# Patient Record
Sex: Female | Born: 1960 | Race: White | Hispanic: No | Smoking: Never smoker
Health system: Southern US, Community
[De-identification: ages and names within clinical notes are randomized; demographics above are authoritative.]

## PROBLEM LIST (undated history)

## (undated) DIAGNOSIS — I10 Essential (primary) hypertension: Secondary | ICD-10-CM

## (undated) DIAGNOSIS — R51 Headache: Secondary | ICD-10-CM

## (undated) DIAGNOSIS — E785 Hyperlipidemia, unspecified: Secondary | ICD-10-CM

## (undated) DIAGNOSIS — I639 Cerebral infarction, unspecified: Secondary | ICD-10-CM

## (undated) DIAGNOSIS — I2510 Atherosclerotic heart disease of native coronary artery without angina pectoris: Secondary | ICD-10-CM

## (undated) DIAGNOSIS — F329 Major depressive disorder, single episode, unspecified: Secondary | ICD-10-CM

## (undated) DIAGNOSIS — M1990 Unspecified osteoarthritis, unspecified site: Secondary | ICD-10-CM

## (undated) DIAGNOSIS — M810 Age-related osteoporosis without current pathological fracture: Secondary | ICD-10-CM

## (undated) DIAGNOSIS — E039 Hypothyroidism, unspecified: Secondary | ICD-10-CM

## (undated) DIAGNOSIS — D649 Anemia, unspecified: Secondary | ICD-10-CM

## (undated) DIAGNOSIS — Z5189 Encounter for other specified aftercare: Secondary | ICD-10-CM

## (undated) DIAGNOSIS — K5900 Constipation, unspecified: Secondary | ICD-10-CM

## (undated) DIAGNOSIS — J189 Pneumonia, unspecified organism: Secondary | ICD-10-CM

## (undated) DIAGNOSIS — R519 Headache, unspecified: Secondary | ICD-10-CM

## (undated) DIAGNOSIS — E079 Disorder of thyroid, unspecified: Secondary | ICD-10-CM

## (undated) DIAGNOSIS — I509 Heart failure, unspecified: Secondary | ICD-10-CM

## (undated) DIAGNOSIS — Z9889 Other specified postprocedural states: Secondary | ICD-10-CM

## (undated) DIAGNOSIS — I219 Acute myocardial infarction, unspecified: Secondary | ICD-10-CM

## (undated) DIAGNOSIS — M3214 Glomerular disease in systemic lupus erythematosus: Secondary | ICD-10-CM

## (undated) DIAGNOSIS — R112 Nausea with vomiting, unspecified: Secondary | ICD-10-CM

## (undated) DIAGNOSIS — K219 Gastro-esophageal reflux disease without esophagitis: Secondary | ICD-10-CM

## (undated) DIAGNOSIS — N189 Chronic kidney disease, unspecified: Secondary | ICD-10-CM

## (undated) DIAGNOSIS — R011 Cardiac murmur, unspecified: Secondary | ICD-10-CM

## (undated) DIAGNOSIS — F32A Depression, unspecified: Secondary | ICD-10-CM

## (undated) DIAGNOSIS — R7303 Prediabetes: Secondary | ICD-10-CM

## (undated) DIAGNOSIS — F419 Anxiety disorder, unspecified: Secondary | ICD-10-CM

## (undated) DIAGNOSIS — M329 Systemic lupus erythematosus, unspecified: Secondary | ICD-10-CM

## (undated) HISTORY — PX: COLONOSCOPY: SHX174

## (undated) HISTORY — DX: Unspecified osteoarthritis, unspecified site: M19.90

## (undated) HISTORY — DX: Age-related osteoporosis without current pathological fracture: M81.0

## (undated) HISTORY — DX: Major depressive disorder, single episode, unspecified: F32.9

## (undated) HISTORY — DX: Cardiac murmur, unspecified: R01.1

## (undated) HISTORY — DX: Essential (primary) hypertension: I10

## (undated) HISTORY — DX: Heart failure, unspecified: I50.9

## (undated) HISTORY — DX: Gastro-esophageal reflux disease without esophagitis: K21.9

## (undated) HISTORY — DX: Systemic lupus erythematosus, unspecified: M32.9

## (undated) HISTORY — DX: Glomerular disease in systemic lupus erythematosus: M32.14

## (undated) HISTORY — PX: CORONARY ARTERY BYPASS GRAFT: SHX141

## (undated) HISTORY — DX: Disorder of thyroid, unspecified: E07.9

## (undated) HISTORY — DX: Hyperlipidemia, unspecified: E78.5

## (undated) HISTORY — PX: WISDOM TOOTH EXTRACTION: SHX21

## (undated) HISTORY — DX: Cerebral infarction, unspecified: I63.9

## (undated) HISTORY — DX: Anxiety disorder, unspecified: F41.9

## (undated) HISTORY — DX: Anemia, unspecified: D64.9

## (undated) HISTORY — DX: Depression, unspecified: F32.A

## (undated) HISTORY — DX: Acute myocardial infarction, unspecified: I21.9

## (undated) HISTORY — PX: JOINT REPLACEMENT: SHX530

## (undated) HISTORY — DX: Chronic kidney disease, unspecified: N18.9

## (undated) HISTORY — DX: Encounter for other specified aftercare: Z51.89

## (undated) HISTORY — PX: BREAST SURGERY: SHX581

---

## 1988-03-20 HISTORY — PX: MUSCLE FLAP CLOSURE: SHX2054

## 2015-04-27 DIAGNOSIS — I10 Essential (primary) hypertension: Secondary | ICD-10-CM | POA: Diagnosis not present

## 2015-04-27 DIAGNOSIS — G894 Chronic pain syndrome: Secondary | ICD-10-CM | POA: Diagnosis not present

## 2015-04-27 DIAGNOSIS — M3214 Glomerular disease in systemic lupus erythematosus: Secondary | ICD-10-CM | POA: Diagnosis not present

## 2015-04-27 DIAGNOSIS — E034 Atrophy of thyroid (acquired): Secondary | ICD-10-CM | POA: Diagnosis not present

## 2015-04-27 DIAGNOSIS — R809 Proteinuria, unspecified: Secondary | ICD-10-CM | POA: Diagnosis not present

## 2015-04-27 DIAGNOSIS — N183 Chronic kidney disease, stage 3 (moderate): Secondary | ICD-10-CM | POA: Diagnosis not present

## 2015-04-27 DIAGNOSIS — E782 Mixed hyperlipidemia: Secondary | ICD-10-CM | POA: Diagnosis not present

## 2015-04-27 DIAGNOSIS — R7301 Impaired fasting glucose: Secondary | ICD-10-CM | POA: Diagnosis not present

## 2015-04-27 DIAGNOSIS — B029 Zoster without complications: Secondary | ICD-10-CM | POA: Diagnosis not present

## 2015-04-27 DIAGNOSIS — R300 Dysuria: Secondary | ICD-10-CM | POA: Diagnosis not present

## 2015-04-27 DIAGNOSIS — N390 Urinary tract infection, site not specified: Secondary | ICD-10-CM | POA: Diagnosis not present

## 2015-05-06 DIAGNOSIS — N390 Urinary tract infection, site not specified: Secondary | ICD-10-CM | POA: Diagnosis not present

## 2015-05-06 DIAGNOSIS — D631 Anemia in chronic kidney disease: Secondary | ICD-10-CM | POA: Diagnosis not present

## 2015-05-06 DIAGNOSIS — I10 Essential (primary) hypertension: Secondary | ICD-10-CM | POA: Diagnosis not present

## 2015-06-09 DIAGNOSIS — N183 Chronic kidney disease, stage 3 (moderate): Secondary | ICD-10-CM | POA: Diagnosis not present

## 2015-07-27 DIAGNOSIS — R799 Abnormal finding of blood chemistry, unspecified: Secondary | ICD-10-CM | POA: Diagnosis not present

## 2015-09-02 DIAGNOSIS — M25562 Pain in left knee: Secondary | ICD-10-CM | POA: Diagnosis not present

## 2015-09-02 DIAGNOSIS — M7122 Synovial cyst of popliteal space [Baker], left knee: Secondary | ICD-10-CM | POA: Diagnosis not present

## 2015-09-02 DIAGNOSIS — R7301 Impaired fasting glucose: Secondary | ICD-10-CM | POA: Diagnosis not present

## 2015-09-02 DIAGNOSIS — G894 Chronic pain syndrome: Secondary | ICD-10-CM | POA: Diagnosis not present

## 2015-09-02 DIAGNOSIS — I119 Hypertensive heart disease without heart failure: Secondary | ICD-10-CM | POA: Diagnosis not present

## 2015-09-02 DIAGNOSIS — M25552 Pain in left hip: Secondary | ICD-10-CM | POA: Diagnosis not present

## 2015-09-02 DIAGNOSIS — M3214 Glomerular disease in systemic lupus erythematosus: Secondary | ICD-10-CM | POA: Diagnosis not present

## 2015-09-02 DIAGNOSIS — D641 Secondary sideroblastic anemia due to disease: Secondary | ICD-10-CM | POA: Diagnosis not present

## 2015-09-02 DIAGNOSIS — E782 Mixed hyperlipidemia: Secondary | ICD-10-CM | POA: Diagnosis not present

## 2015-09-02 DIAGNOSIS — E038 Other specified hypothyroidism: Secondary | ICD-10-CM | POA: Diagnosis not present

## 2015-09-10 DIAGNOSIS — M7122 Synovial cyst of popliteal space [Baker], left knee: Secondary | ICD-10-CM | POA: Diagnosis not present

## 2015-10-20 DIAGNOSIS — M545 Low back pain: Secondary | ICD-10-CM | POA: Diagnosis not present

## 2015-10-20 DIAGNOSIS — Z7689 Persons encountering health services in other specified circumstances: Secondary | ICD-10-CM | POA: Diagnosis not present

## 2015-10-26 DIAGNOSIS — R87612 Low grade squamous intraepithelial lesion on cytologic smear of cervix (LGSIL): Secondary | ICD-10-CM | POA: Diagnosis not present

## 2015-10-26 DIAGNOSIS — Z0000 Encounter for general adult medical examination without abnormal findings: Secondary | ICD-10-CM | POA: Diagnosis not present

## 2015-10-26 DIAGNOSIS — M810 Age-related osteoporosis without current pathological fracture: Secondary | ICD-10-CM | POA: Diagnosis not present

## 2015-10-26 DIAGNOSIS — Z1239 Encounter for other screening for malignant neoplasm of breast: Secondary | ICD-10-CM | POA: Diagnosis not present

## 2015-10-26 DIAGNOSIS — Z1211 Encounter for screening for malignant neoplasm of colon: Secondary | ICD-10-CM | POA: Diagnosis not present

## 2015-11-03 DIAGNOSIS — N390 Urinary tract infection, site not specified: Secondary | ICD-10-CM | POA: Diagnosis not present

## 2015-11-03 DIAGNOSIS — N183 Chronic kidney disease, stage 3 (moderate): Secondary | ICD-10-CM | POA: Diagnosis not present

## 2015-11-03 DIAGNOSIS — E559 Vitamin D deficiency, unspecified: Secondary | ICD-10-CM | POA: Diagnosis not present

## 2015-11-03 DIAGNOSIS — I10 Essential (primary) hypertension: Secondary | ICD-10-CM | POA: Diagnosis not present

## 2015-11-03 DIAGNOSIS — D631 Anemia in chronic kidney disease: Secondary | ICD-10-CM | POA: Diagnosis not present

## 2015-11-11 DIAGNOSIS — Z1211 Encounter for screening for malignant neoplasm of colon: Secondary | ICD-10-CM | POA: Diagnosis not present

## 2015-11-18 DIAGNOSIS — Z1231 Encounter for screening mammogram for malignant neoplasm of breast: Secondary | ICD-10-CM | POA: Diagnosis not present

## 2015-11-25 DIAGNOSIS — E038 Other specified hypothyroidism: Secondary | ICD-10-CM | POA: Diagnosis not present

## 2015-11-25 DIAGNOSIS — M3214 Glomerular disease in systemic lupus erythematosus: Secondary | ICD-10-CM | POA: Diagnosis not present

## 2015-11-25 DIAGNOSIS — F331 Major depressive disorder, recurrent, moderate: Secondary | ICD-10-CM | POA: Diagnosis not present

## 2015-12-01 DIAGNOSIS — N879 Dysplasia of cervix uteri, unspecified: Secondary | ICD-10-CM | POA: Diagnosis not present

## 2015-12-01 DIAGNOSIS — D067 Carcinoma in situ of other parts of cervix: Secondary | ICD-10-CM | POA: Diagnosis not present

## 2015-12-08 DIAGNOSIS — N879 Dysplasia of cervix uteri, unspecified: Secondary | ICD-10-CM | POA: Diagnosis not present

## 2015-12-14 DIAGNOSIS — R7301 Impaired fasting glucose: Secondary | ICD-10-CM | POA: Diagnosis not present

## 2015-12-14 DIAGNOSIS — E782 Mixed hyperlipidemia: Secondary | ICD-10-CM | POA: Diagnosis not present

## 2015-12-14 DIAGNOSIS — I119 Hypertensive heart disease without heart failure: Secondary | ICD-10-CM | POA: Diagnosis not present

## 2015-12-14 DIAGNOSIS — I2510 Atherosclerotic heart disease of native coronary artery without angina pectoris: Secondary | ICD-10-CM | POA: Diagnosis not present

## 2015-12-14 DIAGNOSIS — F331 Major depressive disorder, recurrent, moderate: Secondary | ICD-10-CM | POA: Diagnosis not present

## 2015-12-14 DIAGNOSIS — N3000 Acute cystitis without hematuria: Secondary | ICD-10-CM | POA: Diagnosis not present

## 2015-12-14 DIAGNOSIS — W0110XA Fall on same level from slipping, tripping and stumbling with subsequent striking against unspecified object, initial encounter: Secondary | ICD-10-CM | POA: Diagnosis not present

## 2015-12-14 DIAGNOSIS — Z01810 Encounter for preprocedural cardiovascular examination: Secondary | ICD-10-CM | POA: Diagnosis not present

## 2015-12-14 DIAGNOSIS — E038 Other specified hypothyroidism: Secondary | ICD-10-CM | POA: Diagnosis not present

## 2015-12-20 DIAGNOSIS — Z951 Presence of aortocoronary bypass graft: Secondary | ICD-10-CM | POA: Diagnosis not present

## 2015-12-20 DIAGNOSIS — Z01810 Encounter for preprocedural cardiovascular examination: Secondary | ICD-10-CM | POA: Diagnosis not present

## 2015-12-21 DIAGNOSIS — N879 Dysplasia of cervix uteri, unspecified: Secondary | ICD-10-CM | POA: Diagnosis not present

## 2016-01-13 DIAGNOSIS — Z87891 Personal history of nicotine dependence: Secondary | ICD-10-CM | POA: Diagnosis not present

## 2016-01-13 DIAGNOSIS — D060 Carcinoma in situ of endocervix: Secondary | ICD-10-CM | POA: Diagnosis not present

## 2016-01-13 DIAGNOSIS — Z79899 Other long term (current) drug therapy: Secondary | ICD-10-CM | POA: Diagnosis not present

## 2016-01-13 DIAGNOSIS — E785 Hyperlipidemia, unspecified: Secondary | ICD-10-CM | POA: Diagnosis not present

## 2016-01-13 DIAGNOSIS — N870 Mild cervical dysplasia: Secondary | ICD-10-CM | POA: Diagnosis not present

## 2016-01-13 DIAGNOSIS — R079 Chest pain, unspecified: Secondary | ICD-10-CM | POA: Diagnosis not present

## 2016-01-13 DIAGNOSIS — G4700 Insomnia, unspecified: Secondary | ICD-10-CM | POA: Diagnosis not present

## 2016-01-13 DIAGNOSIS — F419 Anxiety disorder, unspecified: Secondary | ICD-10-CM | POA: Diagnosis not present

## 2016-01-13 DIAGNOSIS — J45909 Unspecified asthma, uncomplicated: Secondary | ICD-10-CM | POA: Diagnosis not present

## 2016-01-13 DIAGNOSIS — Z8673 Personal history of transient ischemic attack (TIA), and cerebral infarction without residual deficits: Secondary | ICD-10-CM | POA: Diagnosis not present

## 2016-01-13 DIAGNOSIS — I252 Old myocardial infarction: Secondary | ICD-10-CM | POA: Diagnosis not present

## 2016-01-13 DIAGNOSIS — N183 Chronic kidney disease, stage 3 (moderate): Secondary | ICD-10-CM | POA: Diagnosis not present

## 2016-01-13 DIAGNOSIS — I2510 Atherosclerotic heart disease of native coronary artery without angina pectoris: Secondary | ICD-10-CM | POA: Diagnosis not present

## 2016-01-13 DIAGNOSIS — E039 Hypothyroidism, unspecified: Secondary | ICD-10-CM | POA: Diagnosis not present

## 2016-01-13 DIAGNOSIS — D069 Carcinoma in situ of cervix, unspecified: Secondary | ICD-10-CM | POA: Diagnosis not present

## 2016-01-13 DIAGNOSIS — F329 Major depressive disorder, single episode, unspecified: Secondary | ICD-10-CM | POA: Diagnosis not present

## 2016-01-13 DIAGNOSIS — K219 Gastro-esophageal reflux disease without esophagitis: Secondary | ICD-10-CM | POA: Diagnosis not present

## 2016-01-13 DIAGNOSIS — I509 Heart failure, unspecified: Secondary | ICD-10-CM | POA: Diagnosis not present

## 2016-01-13 DIAGNOSIS — N871 Moderate cervical dysplasia: Secondary | ICD-10-CM | POA: Diagnosis not present

## 2016-01-13 DIAGNOSIS — I130 Hypertensive heart and chronic kidney disease with heart failure and stage 1 through stage 4 chronic kidney disease, or unspecified chronic kidney disease: Secondary | ICD-10-CM | POA: Diagnosis not present

## 2016-01-13 DIAGNOSIS — N879 Dysplasia of cervix uteri, unspecified: Secondary | ICD-10-CM | POA: Diagnosis not present

## 2016-01-13 DIAGNOSIS — Z951 Presence of aortocoronary bypass graft: Secondary | ICD-10-CM | POA: Diagnosis not present

## 2016-01-27 DIAGNOSIS — M009 Pyogenic arthritis, unspecified: Secondary | ICD-10-CM | POA: Diagnosis not present

## 2016-01-27 DIAGNOSIS — Z79899 Other long term (current) drug therapy: Secondary | ICD-10-CM | POA: Diagnosis not present

## 2016-01-27 DIAGNOSIS — Z87891 Personal history of nicotine dependence: Secondary | ICD-10-CM | POA: Diagnosis not present

## 2016-01-27 DIAGNOSIS — I25708 Atherosclerosis of coronary artery bypass graft(s), unspecified, with other forms of angina pectoris: Secondary | ICD-10-CM | POA: Diagnosis not present

## 2016-01-27 DIAGNOSIS — Z96651 Presence of right artificial knee joint: Secondary | ICD-10-CM | POA: Diagnosis not present

## 2016-01-27 DIAGNOSIS — Z951 Presence of aortocoronary bypass graft: Secondary | ICD-10-CM | POA: Diagnosis not present

## 2016-01-27 DIAGNOSIS — R5084 Febrile nonhemolytic transfusion reaction: Secondary | ICD-10-CM | POA: Diagnosis not present

## 2016-01-27 DIAGNOSIS — A419 Sepsis, unspecified organism: Secondary | ICD-10-CM | POA: Diagnosis not present

## 2016-01-27 DIAGNOSIS — K5900 Constipation, unspecified: Secondary | ICD-10-CM | POA: Diagnosis not present

## 2016-01-27 DIAGNOSIS — Z96659 Presence of unspecified artificial knee joint: Secondary | ICD-10-CM | POA: Diagnosis not present

## 2016-01-27 DIAGNOSIS — Z9989 Dependence on other enabling machines and devices: Secondary | ICD-10-CM | POA: Diagnosis not present

## 2016-01-27 DIAGNOSIS — R52 Pain, unspecified: Secondary | ICD-10-CM | POA: Diagnosis not present

## 2016-01-27 DIAGNOSIS — J9811 Atelectasis: Secondary | ICD-10-CM | POA: Diagnosis not present

## 2016-01-27 DIAGNOSIS — I2510 Atherosclerotic heart disease of native coronary artery without angina pectoris: Secondary | ICD-10-CM | POA: Diagnosis not present

## 2016-01-27 DIAGNOSIS — R079 Chest pain, unspecified: Secondary | ICD-10-CM | POA: Diagnosis not present

## 2016-01-27 DIAGNOSIS — I25119 Atherosclerotic heart disease of native coronary artery with unspecified angina pectoris: Secondary | ICD-10-CM | POA: Diagnosis not present

## 2016-01-27 DIAGNOSIS — M3214 Glomerular disease in systemic lupus erythematosus: Secondary | ICD-10-CM | POA: Diagnosis not present

## 2016-01-27 DIAGNOSIS — K219 Gastro-esophageal reflux disease without esophagitis: Secondary | ICD-10-CM | POA: Diagnosis not present

## 2016-01-27 DIAGNOSIS — M109 Gout, unspecified: Secondary | ICD-10-CM | POA: Diagnosis not present

## 2016-01-27 DIAGNOSIS — I10 Essential (primary) hypertension: Secondary | ICD-10-CM | POA: Diagnosis not present

## 2016-01-27 DIAGNOSIS — E039 Hypothyroidism, unspecified: Secondary | ICD-10-CM | POA: Diagnosis not present

## 2016-01-27 DIAGNOSIS — M25462 Effusion, left knee: Secondary | ICD-10-CM | POA: Diagnosis not present

## 2016-01-27 DIAGNOSIS — Z96652 Presence of left artificial knee joint: Secondary | ICD-10-CM | POA: Diagnosis not present

## 2016-01-27 DIAGNOSIS — M069 Rheumatoid arthritis, unspecified: Secondary | ICD-10-CM | POA: Diagnosis not present

## 2016-01-27 DIAGNOSIS — M25561 Pain in right knee: Secondary | ICD-10-CM | POA: Diagnosis not present

## 2016-01-27 DIAGNOSIS — I132 Hypertensive heart and chronic kidney disease with heart failure and with stage 5 chronic kidney disease, or end stage renal disease: Secondary | ICD-10-CM | POA: Diagnosis not present

## 2016-01-27 DIAGNOSIS — R2689 Other abnormalities of gait and mobility: Secondary | ICD-10-CM | POA: Diagnosis not present

## 2016-01-27 DIAGNOSIS — F419 Anxiety disorder, unspecified: Secondary | ICD-10-CM | POA: Diagnosis not present

## 2016-01-27 DIAGNOSIS — D649 Anemia, unspecified: Secondary | ICD-10-CM | POA: Diagnosis not present

## 2016-01-27 DIAGNOSIS — Z8673 Personal history of transient ischemic attack (TIA), and cerebral infarction without residual deficits: Secondary | ICD-10-CM | POA: Diagnosis not present

## 2016-01-27 DIAGNOSIS — R404 Transient alteration of awareness: Secondary | ICD-10-CM | POA: Diagnosis not present

## 2016-01-27 DIAGNOSIS — I444 Left anterior fascicular block: Secondary | ICD-10-CM | POA: Diagnosis not present

## 2016-01-27 DIAGNOSIS — T847XXA Infection and inflammatory reaction due to other internal orthopedic prosthetic devices, implants and grafts, initial encounter: Secondary | ICD-10-CM | POA: Diagnosis not present

## 2016-01-27 DIAGNOSIS — G8918 Other acute postprocedural pain: Secondary | ICD-10-CM | POA: Diagnosis not present

## 2016-01-27 DIAGNOSIS — I25810 Atherosclerosis of coronary artery bypass graft(s) without angina pectoris: Secondary | ICD-10-CM | POA: Diagnosis not present

## 2016-01-27 DIAGNOSIS — F329 Major depressive disorder, single episode, unspecified: Secondary | ICD-10-CM | POA: Diagnosis not present

## 2016-01-27 DIAGNOSIS — Y92019 Unspecified place in single-family (private) house as the place of occurrence of the external cause: Secondary | ICD-10-CM | POA: Diagnosis not present

## 2016-01-27 DIAGNOSIS — Z96653 Presence of artificial knee joint, bilateral: Secondary | ICD-10-CM | POA: Diagnosis not present

## 2016-01-27 DIAGNOSIS — B9689 Other specified bacterial agents as the cause of diseases classified elsewhere: Secondary | ICD-10-CM | POA: Diagnosis not present

## 2016-01-27 DIAGNOSIS — N185 Chronic kidney disease, stage 5: Secondary | ICD-10-CM | POA: Diagnosis not present

## 2016-01-27 DIAGNOSIS — E1122 Type 2 diabetes mellitus with diabetic chronic kidney disease: Secondary | ICD-10-CM | POA: Diagnosis not present

## 2016-01-27 DIAGNOSIS — Z85528 Personal history of other malignant neoplasm of kidney: Secondary | ICD-10-CM | POA: Diagnosis not present

## 2016-01-27 DIAGNOSIS — T8454XA Infection and inflammatory reaction due to internal left knee prosthesis, initial encounter: Secondary | ICD-10-CM | POA: Diagnosis not present

## 2016-01-27 DIAGNOSIS — M00862 Arthritis due to other bacteria, left knee: Secondary | ICD-10-CM | POA: Diagnosis not present

## 2016-01-27 DIAGNOSIS — R269 Unspecified abnormalities of gait and mobility: Secondary | ICD-10-CM | POA: Diagnosis not present

## 2016-01-27 DIAGNOSIS — N179 Acute kidney failure, unspecified: Secondary | ICD-10-CM | POA: Diagnosis not present

## 2016-01-27 DIAGNOSIS — I252 Old myocardial infarction: Secondary | ICD-10-CM | POA: Diagnosis not present

## 2016-01-27 DIAGNOSIS — I7300 Raynaud's syndrome without gangrene: Secondary | ICD-10-CM | POA: Diagnosis not present

## 2016-01-27 DIAGNOSIS — I25709 Atherosclerosis of coronary artery bypass graft(s), unspecified, with unspecified angina pectoris: Secondary | ICD-10-CM | POA: Diagnosis not present

## 2016-01-27 DIAGNOSIS — A4189 Other specified sepsis: Secondary | ICD-10-CM | POA: Diagnosis not present

## 2016-01-27 DIAGNOSIS — M25562 Pain in left knee: Secondary | ICD-10-CM | POA: Diagnosis not present

## 2016-01-27 DIAGNOSIS — T8459XA Infection and inflammatory reaction due to other internal joint prosthesis, initial encounter: Secondary | ICD-10-CM | POA: Diagnosis not present

## 2016-01-27 DIAGNOSIS — I509 Heart failure, unspecified: Secondary | ICD-10-CM | POA: Diagnosis not present

## 2016-01-27 DIAGNOSIS — R509 Fever, unspecified: Secondary | ICD-10-CM | POA: Diagnosis not present

## 2016-01-27 DIAGNOSIS — E876 Hypokalemia: Secondary | ICD-10-CM | POA: Diagnosis not present

## 2016-01-27 DIAGNOSIS — T182XXA Foreign body in stomach, initial encounter: Secondary | ICD-10-CM | POA: Diagnosis not present

## 2016-01-31 DIAGNOSIS — N179 Acute kidney failure, unspecified: Secondary | ICD-10-CM | POA: Diagnosis not present

## 2016-01-31 DIAGNOSIS — D649 Anemia, unspecified: Secondary | ICD-10-CM | POA: Diagnosis not present

## 2016-02-11 DIAGNOSIS — M8700 Idiopathic aseptic necrosis of unspecified bone: Secondary | ICD-10-CM | POA: Diagnosis not present

## 2016-02-11 DIAGNOSIS — F329 Major depressive disorder, single episode, unspecified: Secondary | ICD-10-CM | POA: Diagnosis not present

## 2016-02-11 DIAGNOSIS — M109 Gout, unspecified: Secondary | ICD-10-CM | POA: Diagnosis not present

## 2016-02-11 DIAGNOSIS — Z471 Aftercare following joint replacement surgery: Secondary | ICD-10-CM | POA: Diagnosis not present

## 2016-02-11 DIAGNOSIS — Z96652 Presence of left artificial knee joint: Secondary | ICD-10-CM | POA: Diagnosis not present

## 2016-02-11 DIAGNOSIS — Z8673 Personal history of transient ischemic attack (TIA), and cerebral infarction without residual deficits: Secondary | ICD-10-CM | POA: Diagnosis not present

## 2016-02-11 DIAGNOSIS — I2510 Atherosclerotic heart disease of native coronary artery without angina pectoris: Secondary | ICD-10-CM | POA: Diagnosis not present

## 2016-02-11 DIAGNOSIS — I252 Old myocardial infarction: Secondary | ICD-10-CM | POA: Diagnosis not present

## 2016-02-11 DIAGNOSIS — M3214 Glomerular disease in systemic lupus erythematosus: Secondary | ICD-10-CM | POA: Diagnosis not present

## 2016-02-11 DIAGNOSIS — I509 Heart failure, unspecified: Secondary | ICD-10-CM | POA: Diagnosis not present

## 2016-02-13 DIAGNOSIS — S73005D Unspecified dislocation of left hip, subsequent encounter: Secondary | ICD-10-CM | POA: Diagnosis not present

## 2016-02-13 DIAGNOSIS — M25559 Pain in unspecified hip: Secondary | ICD-10-CM | POA: Diagnosis not present

## 2016-02-13 DIAGNOSIS — M24452 Recurrent dislocation, left hip: Secondary | ICD-10-CM | POA: Diagnosis not present

## 2016-02-13 DIAGNOSIS — M25552 Pain in left hip: Secondary | ICD-10-CM | POA: Diagnosis not present

## 2016-02-14 DIAGNOSIS — R402411 Glasgow coma scale score 13-15, in the field [EMT or ambulance]: Secondary | ICD-10-CM | POA: Diagnosis not present

## 2016-02-14 DIAGNOSIS — R4182 Altered mental status, unspecified: Secondary | ICD-10-CM | POA: Diagnosis not present

## 2016-02-14 DIAGNOSIS — R410 Disorientation, unspecified: Secondary | ICD-10-CM | POA: Diagnosis not present

## 2016-02-15 DIAGNOSIS — R109 Unspecified abdominal pain: Secondary | ICD-10-CM | POA: Diagnosis not present

## 2016-02-15 DIAGNOSIS — R011 Cardiac murmur, unspecified: Secondary | ICD-10-CM | POA: Diagnosis not present

## 2016-02-15 DIAGNOSIS — Z87891 Personal history of nicotine dependence: Secondary | ICD-10-CM | POA: Diagnosis not present

## 2016-02-15 DIAGNOSIS — R4182 Altered mental status, unspecified: Secondary | ICD-10-CM | POA: Diagnosis not present

## 2016-02-15 DIAGNOSIS — M8700 Idiopathic aseptic necrosis of unspecified bone: Secondary | ICD-10-CM | POA: Diagnosis not present

## 2016-02-15 DIAGNOSIS — R918 Other nonspecific abnormal finding of lung field: Secondary | ICD-10-CM | POA: Diagnosis not present

## 2016-02-15 DIAGNOSIS — M3214 Glomerular disease in systemic lupus erythematosus: Secondary | ICD-10-CM | POA: Diagnosis not present

## 2016-02-15 DIAGNOSIS — Z96652 Presence of left artificial knee joint: Secondary | ICD-10-CM | POA: Diagnosis not present

## 2016-02-15 DIAGNOSIS — M329 Systemic lupus erythematosus, unspecified: Secondary | ICD-10-CM | POA: Diagnosis not present

## 2016-02-15 DIAGNOSIS — N189 Chronic kidney disease, unspecified: Secondary | ICD-10-CM | POA: Diagnosis not present

## 2016-02-15 DIAGNOSIS — N184 Chronic kidney disease, stage 4 (severe): Secondary | ICD-10-CM | POA: Diagnosis not present

## 2016-02-15 DIAGNOSIS — I509 Heart failure, unspecified: Secondary | ICD-10-CM | POA: Diagnosis not present

## 2016-02-15 DIAGNOSIS — R0902 Hypoxemia: Secondary | ICD-10-CM | POA: Diagnosis not present

## 2016-02-15 DIAGNOSIS — I10 Essential (primary) hypertension: Secondary | ICD-10-CM | POA: Diagnosis not present

## 2016-02-15 DIAGNOSIS — T40604A Poisoning by unspecified narcotics, undetermined, initial encounter: Secondary | ICD-10-CM | POA: Diagnosis not present

## 2016-02-15 DIAGNOSIS — N179 Acute kidney failure, unspecified: Secondary | ICD-10-CM | POA: Diagnosis not present

## 2016-02-15 DIAGNOSIS — Z951 Presence of aortocoronary bypass graft: Secondary | ICD-10-CM | POA: Diagnosis not present

## 2016-02-15 DIAGNOSIS — Z96653 Presence of artificial knee joint, bilateral: Secondary | ICD-10-CM | POA: Diagnosis not present

## 2016-02-15 DIAGNOSIS — T40601A Poisoning by unspecified narcotics, accidental (unintentional), initial encounter: Secondary | ICD-10-CM | POA: Diagnosis not present

## 2016-02-15 DIAGNOSIS — K219 Gastro-esophageal reflux disease without esophagitis: Secondary | ICD-10-CM | POA: Diagnosis not present

## 2016-02-15 DIAGNOSIS — M25562 Pain in left knee: Secondary | ICD-10-CM | POA: Diagnosis not present

## 2016-02-15 DIAGNOSIS — M109 Gout, unspecified: Secondary | ICD-10-CM | POA: Diagnosis not present

## 2016-02-15 DIAGNOSIS — Z8673 Personal history of transient ischemic attack (TIA), and cerebral infarction without residual deficits: Secondary | ICD-10-CM | POA: Diagnosis not present

## 2016-02-15 DIAGNOSIS — R5383 Other fatigue: Secondary | ICD-10-CM | POA: Diagnosis not present

## 2016-02-15 DIAGNOSIS — M009 Pyogenic arthritis, unspecified: Secondary | ICD-10-CM | POA: Diagnosis not present

## 2016-02-15 DIAGNOSIS — I110 Hypertensive heart disease with heart failure: Secondary | ICD-10-CM | POA: Diagnosis not present

## 2016-02-15 DIAGNOSIS — I7300 Raynaud's syndrome without gangrene: Secondary | ICD-10-CM | POA: Diagnosis not present

## 2016-02-15 DIAGNOSIS — T40605A Adverse effect of unspecified narcotics, initial encounter: Secondary | ICD-10-CM | POA: Diagnosis not present

## 2016-02-15 DIAGNOSIS — R531 Weakness: Secondary | ICD-10-CM | POA: Diagnosis not present

## 2016-02-15 DIAGNOSIS — M25561 Pain in right knee: Secondary | ICD-10-CM | POA: Diagnosis not present

## 2016-02-15 DIAGNOSIS — I252 Old myocardial infarction: Secondary | ICD-10-CM | POA: Diagnosis not present

## 2016-02-15 DIAGNOSIS — Z471 Aftercare following joint replacement surgery: Secondary | ICD-10-CM | POA: Diagnosis not present

## 2016-02-15 DIAGNOSIS — Z85528 Personal history of other malignant neoplasm of kidney: Secondary | ICD-10-CM | POA: Diagnosis not present

## 2016-02-15 DIAGNOSIS — I2510 Atherosclerotic heart disease of native coronary artery without angina pectoris: Secondary | ICD-10-CM | POA: Diagnosis not present

## 2016-02-15 DIAGNOSIS — D62 Acute posthemorrhagic anemia: Secondary | ICD-10-CM | POA: Diagnosis not present

## 2016-02-15 DIAGNOSIS — I129 Hypertensive chronic kidney disease with stage 1 through stage 4 chronic kidney disease, or unspecified chronic kidney disease: Secondary | ICD-10-CM | POA: Diagnosis not present

## 2016-02-15 DIAGNOSIS — F329 Major depressive disorder, single episode, unspecified: Secondary | ICD-10-CM | POA: Diagnosis not present

## 2016-02-15 DIAGNOSIS — D649 Anemia, unspecified: Secondary | ICD-10-CM | POA: Diagnosis not present

## 2016-02-16 DIAGNOSIS — R4182 Altered mental status, unspecified: Secondary | ICD-10-CM | POA: Diagnosis not present

## 2016-02-16 DIAGNOSIS — R5383 Other fatigue: Secondary | ICD-10-CM | POA: Diagnosis not present

## 2016-02-24 DIAGNOSIS — M8700 Idiopathic aseptic necrosis of unspecified bone: Secondary | ICD-10-CM | POA: Diagnosis not present

## 2016-02-24 DIAGNOSIS — I509 Heart failure, unspecified: Secondary | ICD-10-CM | POA: Diagnosis not present

## 2016-02-24 DIAGNOSIS — M109 Gout, unspecified: Secondary | ICD-10-CM | POA: Diagnosis not present

## 2016-02-24 DIAGNOSIS — M3214 Glomerular disease in systemic lupus erythematosus: Secondary | ICD-10-CM | POA: Diagnosis not present

## 2016-02-24 DIAGNOSIS — Z96652 Presence of left artificial knee joint: Secondary | ICD-10-CM | POA: Diagnosis not present

## 2016-02-24 DIAGNOSIS — Z8673 Personal history of transient ischemic attack (TIA), and cerebral infarction without residual deficits: Secondary | ICD-10-CM | POA: Diagnosis not present

## 2016-02-24 DIAGNOSIS — F329 Major depressive disorder, single episode, unspecified: Secondary | ICD-10-CM | POA: Diagnosis not present

## 2016-02-24 DIAGNOSIS — Z471 Aftercare following joint replacement surgery: Secondary | ICD-10-CM | POA: Diagnosis not present

## 2016-02-24 DIAGNOSIS — I2510 Atherosclerotic heart disease of native coronary artery without angina pectoris: Secondary | ICD-10-CM | POA: Diagnosis not present

## 2016-02-24 DIAGNOSIS — I252 Old myocardial infarction: Secondary | ICD-10-CM | POA: Diagnosis not present

## 2016-02-28 DIAGNOSIS — N183 Chronic kidney disease, stage 3 (moderate): Secondary | ICD-10-CM | POA: Diagnosis not present

## 2016-02-28 DIAGNOSIS — D641 Secondary sideroblastic anemia due to disease: Secondary | ICD-10-CM | POA: Diagnosis not present

## 2016-02-28 DIAGNOSIS — M00862 Arthritis due to other bacteria, left knee: Secondary | ICD-10-CM | POA: Diagnosis not present

## 2016-02-28 DIAGNOSIS — M25562 Pain in left knee: Secondary | ICD-10-CM | POA: Diagnosis not present

## 2016-03-03 ENCOUNTER — Ambulatory Visit: Payer: Self-pay

## 2016-03-05 DIAGNOSIS — M24452 Recurrent dislocation, left hip: Secondary | ICD-10-CM | POA: Diagnosis not present

## 2016-03-05 DIAGNOSIS — Z96643 Presence of artificial hip joint, bilateral: Secondary | ICD-10-CM | POA: Diagnosis not present

## 2016-03-05 DIAGNOSIS — S73005A Unspecified dislocation of left hip, initial encounter: Secondary | ICD-10-CM | POA: Diagnosis not present

## 2016-03-05 DIAGNOSIS — X58XXXA Exposure to other specified factors, initial encounter: Secondary | ICD-10-CM | POA: Diagnosis not present

## 2016-03-05 DIAGNOSIS — M25552 Pain in left hip: Secondary | ICD-10-CM | POA: Diagnosis not present

## 2016-03-09 DIAGNOSIS — I509 Heart failure, unspecified: Secondary | ICD-10-CM | POA: Diagnosis not present

## 2016-03-09 DIAGNOSIS — F329 Major depressive disorder, single episode, unspecified: Secondary | ICD-10-CM | POA: Diagnosis not present

## 2016-03-09 DIAGNOSIS — I2510 Atherosclerotic heart disease of native coronary artery without angina pectoris: Secondary | ICD-10-CM | POA: Diagnosis not present

## 2016-03-09 DIAGNOSIS — I252 Old myocardial infarction: Secondary | ICD-10-CM | POA: Diagnosis not present

## 2016-03-09 DIAGNOSIS — M8700 Idiopathic aseptic necrosis of unspecified bone: Secondary | ICD-10-CM | POA: Diagnosis not present

## 2016-03-09 DIAGNOSIS — Z471 Aftercare following joint replacement surgery: Secondary | ICD-10-CM | POA: Diagnosis not present

## 2016-03-09 DIAGNOSIS — Z8673 Personal history of transient ischemic attack (TIA), and cerebral infarction without residual deficits: Secondary | ICD-10-CM | POA: Diagnosis not present

## 2016-03-09 DIAGNOSIS — M109 Gout, unspecified: Secondary | ICD-10-CM | POA: Diagnosis not present

## 2016-03-09 DIAGNOSIS — Z96652 Presence of left artificial knee joint: Secondary | ICD-10-CM | POA: Diagnosis not present

## 2016-03-09 DIAGNOSIS — M3214 Glomerular disease in systemic lupus erythematosus: Secondary | ICD-10-CM | POA: Diagnosis not present

## 2016-03-14 ENCOUNTER — Other Ambulatory Visit: Payer: Self-pay

## 2016-03-14 NOTE — Patient Outreach (Signed)
[name]) Care Management  03/14/2016  Crystal Clarke May 13, 1960 [id number]   Transition of care follow up  PROVIDER: Dr. Dirk Dress  SOCIAL: Patient lives alone, Has transportation  CONSENT: HIPAA verified with patient.  Patient verbally agreed to [facility] care management services and transition of care follow up with visit.   ADVANCE DIRECTIVE:  Patient states she has a living will, health care power of attorney, and DNR order.  MEDICATION: Patient states she is on approximately 12 medications. States she is able to afford her medications and is taking them as prescribed.   SUBJECTIVE: Telephone call to patient regarding [place] referral. HIPAA verified with patient.  Patient states she had knee surgery at [facility] hospital in November 2017 due to infection in her left knee. Patient states she was discharged on 02/10/16.  Patient states she had 3 emergency room visits at [facility] after her knee surgery due to dislocated hip x 2 and 2 falls.  Patient reports [facility] is currently providing nursing/ physical therapy and occupational therapy services.  Patient states she would like information regarding housekeeping services.  Patient states it is difficult for her to keep her house since the knee surgery.  Patient states she will be seeing her primary MD on 03/21/16.  Patient states this will be the first time seeing primary MD since her surgery in November 2017.  ASSESSMENT: Transition of care follow up.  High risk potential for readmission.  Patient will benefit from community case manager and social worker follow up.  PLAN: RNCM will refer patient to community case Freight forwarder and Education officer, museum.  [name] RN,BSN,CCM [facility] Telephonic  [phone]

## 2016-03-15 ENCOUNTER — Other Ambulatory Visit: Payer: Self-pay

## 2016-03-15 DIAGNOSIS — M3214 Glomerular disease in systemic lupus erythematosus: Secondary | ICD-10-CM | POA: Diagnosis not present

## 2016-03-15 DIAGNOSIS — F329 Major depressive disorder, single episode, unspecified: Secondary | ICD-10-CM | POA: Diagnosis not present

## 2016-03-15 DIAGNOSIS — Y838 Other surgical procedures as the cause of abnormal reaction of the patient, or of later complication, without mention of misadventure at the time of the procedure: Secondary | ICD-10-CM | POA: Diagnosis not present

## 2016-03-15 DIAGNOSIS — Z96652 Presence of left artificial knee joint: Secondary | ICD-10-CM | POA: Diagnosis not present

## 2016-03-15 DIAGNOSIS — T8459XD Infection and inflammatory reaction due to other internal joint prosthesis, subsequent encounter: Secondary | ICD-10-CM | POA: Diagnosis not present

## 2016-03-15 DIAGNOSIS — Z882 Allergy status to sulfonamides status: Secondary | ICD-10-CM | POA: Diagnosis not present

## 2016-03-15 DIAGNOSIS — Z7982 Long term (current) use of aspirin: Secondary | ICD-10-CM | POA: Diagnosis not present

## 2016-03-15 DIAGNOSIS — Z8673 Personal history of transient ischemic attack (TIA), and cerebral infarction without residual deficits: Secondary | ICD-10-CM | POA: Diagnosis not present

## 2016-03-15 DIAGNOSIS — Z471 Aftercare following joint replacement surgery: Secondary | ICD-10-CM | POA: Diagnosis not present

## 2016-03-15 DIAGNOSIS — M8700 Idiopathic aseptic necrosis of unspecified bone: Secondary | ICD-10-CM | POA: Diagnosis not present

## 2016-03-15 DIAGNOSIS — Z87891 Personal history of nicotine dependence: Secondary | ICD-10-CM | POA: Diagnosis not present

## 2016-03-15 DIAGNOSIS — M109 Gout, unspecified: Secondary | ICD-10-CM | POA: Diagnosis not present

## 2016-03-15 DIAGNOSIS — Z79899 Other long term (current) drug therapy: Secondary | ICD-10-CM | POA: Diagnosis not present

## 2016-03-15 DIAGNOSIS — Z951 Presence of aortocoronary bypass graft: Secondary | ICD-10-CM | POA: Diagnosis not present

## 2016-03-15 DIAGNOSIS — I252 Old myocardial infarction: Secondary | ICD-10-CM | POA: Diagnosis not present

## 2016-03-15 DIAGNOSIS — Z881 Allergy status to other antibiotic agents status: Secondary | ICD-10-CM | POA: Diagnosis not present

## 2016-03-15 DIAGNOSIS — I509 Heart failure, unspecified: Secondary | ICD-10-CM | POA: Diagnosis not present

## 2016-03-15 DIAGNOSIS — I2510 Atherosclerotic heart disease of native coronary artery without angina pectoris: Secondary | ICD-10-CM | POA: Diagnosis not present

## 2016-03-15 NOTE — Patient Outreach (Signed)
New referral for community nurse: Placed call to patient who reports that she just got home from a 5 hour trip, has a nurse coming to her house in 15 minutes.  Reports this is not a good day for her. Request a call back in 1 week.  PLAN: Will call patient back in 1 week to assess needs and offer home visit.  Tomasa Rand, RN, BSN, CEN [facility] [employer] [phone]

## 2016-03-16 DIAGNOSIS — M3214 Glomerular disease in systemic lupus erythematosus: Secondary | ICD-10-CM | POA: Diagnosis not present

## 2016-03-16 DIAGNOSIS — Z96652 Presence of left artificial knee joint: Secondary | ICD-10-CM | POA: Diagnosis not present

## 2016-03-16 DIAGNOSIS — I2510 Atherosclerotic heart disease of native coronary artery without angina pectoris: Secondary | ICD-10-CM | POA: Diagnosis not present

## 2016-03-16 DIAGNOSIS — F329 Major depressive disorder, single episode, unspecified: Secondary | ICD-10-CM | POA: Diagnosis not present

## 2016-03-16 DIAGNOSIS — I252 Old myocardial infarction: Secondary | ICD-10-CM | POA: Diagnosis not present

## 2016-03-16 DIAGNOSIS — Z471 Aftercare following joint replacement surgery: Secondary | ICD-10-CM | POA: Diagnosis not present

## 2016-03-16 DIAGNOSIS — I509 Heart failure, unspecified: Secondary | ICD-10-CM | POA: Diagnosis not present

## 2016-03-16 DIAGNOSIS — M109 Gout, unspecified: Secondary | ICD-10-CM | POA: Diagnosis not present

## 2016-03-16 DIAGNOSIS — Z8673 Personal history of transient ischemic attack (TIA), and cerebral infarction without residual deficits: Secondary | ICD-10-CM | POA: Diagnosis not present

## 2016-03-16 DIAGNOSIS — M8700 Idiopathic aseptic necrosis of unspecified bone: Secondary | ICD-10-CM | POA: Diagnosis not present

## 2016-03-21 DIAGNOSIS — I119 Hypertensive heart disease without heart failure: Secondary | ICD-10-CM | POA: Diagnosis not present

## 2016-03-21 DIAGNOSIS — K219 Gastro-esophageal reflux disease without esophagitis: Secondary | ICD-10-CM | POA: Diagnosis not present

## 2016-03-21 DIAGNOSIS — M00862 Arthritis due to other bacteria, left knee: Secondary | ICD-10-CM | POA: Diagnosis not present

## 2016-03-21 DIAGNOSIS — E782 Mixed hyperlipidemia: Secondary | ICD-10-CM | POA: Diagnosis not present

## 2016-03-21 DIAGNOSIS — F331 Major depressive disorder, recurrent, moderate: Secondary | ICD-10-CM | POA: Diagnosis not present

## 2016-03-21 DIAGNOSIS — M3214 Glomerular disease in systemic lupus erythematosus: Secondary | ICD-10-CM | POA: Diagnosis not present

## 2016-03-21 DIAGNOSIS — N183 Chronic kidney disease, stage 3 (moderate): Secondary | ICD-10-CM | POA: Diagnosis not present

## 2016-03-21 DIAGNOSIS — R7301 Impaired fasting glucose: Secondary | ICD-10-CM | POA: Diagnosis not present

## 2016-03-22 ENCOUNTER — Other Ambulatory Visit: Payer: Self-pay

## 2016-03-22 DIAGNOSIS — I2510 Atherosclerotic heart disease of native coronary artery without angina pectoris: Secondary | ICD-10-CM | POA: Diagnosis not present

## 2016-03-22 DIAGNOSIS — I509 Heart failure, unspecified: Secondary | ICD-10-CM | POA: Diagnosis not present

## 2016-03-22 DIAGNOSIS — F329 Major depressive disorder, single episode, unspecified: Secondary | ICD-10-CM | POA: Diagnosis not present

## 2016-03-22 DIAGNOSIS — Z8673 Personal history of transient ischemic attack (TIA), and cerebral infarction without residual deficits: Secondary | ICD-10-CM | POA: Diagnosis not present

## 2016-03-22 DIAGNOSIS — M3214 Glomerular disease in systemic lupus erythematosus: Secondary | ICD-10-CM | POA: Diagnosis not present

## 2016-03-22 DIAGNOSIS — M109 Gout, unspecified: Secondary | ICD-10-CM | POA: Diagnosis not present

## 2016-03-22 DIAGNOSIS — Z471 Aftercare following joint replacement surgery: Secondary | ICD-10-CM | POA: Diagnosis not present

## 2016-03-22 DIAGNOSIS — Z96652 Presence of left artificial knee joint: Secondary | ICD-10-CM | POA: Diagnosis not present

## 2016-03-22 DIAGNOSIS — M8700 Idiopathic aseptic necrosis of unspecified bone: Secondary | ICD-10-CM | POA: Diagnosis not present

## 2016-03-22 DIAGNOSIS — I252 Old myocardial infarction: Secondary | ICD-10-CM | POA: Diagnosis not present

## 2016-03-22 NOTE — Patient Outreach (Signed)
Case Closure:  Placed call to patient as follow up from last weeks outreach attempt. Explained reason for call.  Patient states that she has not had any more falls. States that she continues to work with [facility] with nursing, OT and PT.  I explained that she can have both services and patient states to me that she is not interested. I reviewed source of referral from her insurance provider.  Patient agreed for me to mail her a letter with contact information if she needs [place] services in the future. Patient has declines [facility] services at this time.  PLAN: Will mail patient letter. Will notify MD of case closure. Will notify [facility] care management assistant to close case.  Tomasa Rand, RN, BSN, CEN [facility] [employer] [phone]

## 2016-03-27 DIAGNOSIS — N183 Chronic kidney disease, stage 3 (moderate): Secondary | ICD-10-CM | POA: Diagnosis not present

## 2016-03-27 DIAGNOSIS — D631 Anemia in chronic kidney disease: Secondary | ICD-10-CM | POA: Diagnosis not present

## 2016-03-27 DIAGNOSIS — R809 Proteinuria, unspecified: Secondary | ICD-10-CM | POA: Diagnosis not present

## 2016-03-27 DIAGNOSIS — I10 Essential (primary) hypertension: Secondary | ICD-10-CM | POA: Diagnosis not present

## 2016-03-27 DIAGNOSIS — N390 Urinary tract infection, site not specified: Secondary | ICD-10-CM | POA: Diagnosis not present

## 2016-03-28 ENCOUNTER — Other Ambulatory Visit: Payer: Self-pay | Admitting: *Deleted

## 2016-03-29 ENCOUNTER — Other Ambulatory Visit: Payer: Self-pay | Admitting: *Deleted

## 2016-03-30 NOTE — Patient Outreach (Signed)
[place] [facility]) Care Management  03/30/2016  Crystal Clarke 24-Aug-1960 [id number]   CSW left message for patient in an effort to discuss [place] and will call back again 1/12 if not return call.   [name], MSW, [place] Worker  [name] [phone]

## 2016-03-30 NOTE — Patient Outreach (Signed)
[facility]) Care Management  03/28/2016  Late Entry  BEULAH CAPOBIANCO Mar 19, 1961 [id number]   CSW spoke with patient briefly regarding  [facility] CSW role and referral reason to assist with housekeeping support. Patient provided her monthly income amount to CSW which is too high for Medicaid. She does receive $16 food stamps per her report.  CSW advised patient of limited resource offerings and encouraged her to consider private pay to agency or person (including family/friend) to assist her with housekeeping needs.  "I go back to see the doctor this week".  She asked that I call her back as she had visitors in home.   [name], MSW, [place] Worker  [place] [phone]

## 2016-03-31 ENCOUNTER — Other Ambulatory Visit: Payer: Self-pay | Admitting: *Deleted

## 2016-03-31 DIAGNOSIS — Z79899 Other long term (current) drug therapy: Secondary | ICD-10-CM | POA: Diagnosis not present

## 2016-03-31 DIAGNOSIS — A498 Other bacterial infections of unspecified site: Secondary | ICD-10-CM | POA: Diagnosis not present

## 2016-03-31 DIAGNOSIS — Z881 Allergy status to other antibiotic agents status: Secondary | ICD-10-CM | POA: Diagnosis not present

## 2016-03-31 DIAGNOSIS — Z882 Allergy status to sulfonamides status: Secondary | ICD-10-CM | POA: Diagnosis not present

## 2016-03-31 DIAGNOSIS — M00862 Arthritis due to other bacteria, left knee: Secondary | ICD-10-CM | POA: Diagnosis not present

## 2016-03-31 DIAGNOSIS — Z7982 Long term (current) use of aspirin: Secondary | ICD-10-CM | POA: Diagnosis not present

## 2016-03-31 DIAGNOSIS — B9689 Other specified bacterial agents as the cause of diseases classified elsewhere: Secondary | ICD-10-CM | POA: Diagnosis not present

## 2016-03-31 DIAGNOSIS — I2510 Atherosclerotic heart disease of native coronary artery without angina pectoris: Secondary | ICD-10-CM | POA: Diagnosis not present

## 2016-03-31 DIAGNOSIS — E785 Hyperlipidemia, unspecified: Secondary | ICD-10-CM | POA: Diagnosis not present

## 2016-03-31 DIAGNOSIS — I10 Essential (primary) hypertension: Secondary | ICD-10-CM | POA: Diagnosis not present

## 2016-03-31 DIAGNOSIS — I739 Peripheral vascular disease, unspecified: Secondary | ICD-10-CM | POA: Diagnosis not present

## 2016-03-31 DIAGNOSIS — Z888 Allergy status to other drugs, medicaments and biological substances status: Secondary | ICD-10-CM | POA: Diagnosis not present

## 2016-04-03 DIAGNOSIS — I509 Heart failure, unspecified: Secondary | ICD-10-CM | POA: Diagnosis not present

## 2016-04-03 DIAGNOSIS — F329 Major depressive disorder, single episode, unspecified: Secondary | ICD-10-CM | POA: Diagnosis not present

## 2016-04-03 DIAGNOSIS — I252 Old myocardial infarction: Secondary | ICD-10-CM | POA: Diagnosis not present

## 2016-04-03 DIAGNOSIS — Z96652 Presence of left artificial knee joint: Secondary | ICD-10-CM | POA: Diagnosis not present

## 2016-04-03 DIAGNOSIS — I2510 Atherosclerotic heart disease of native coronary artery without angina pectoris: Secondary | ICD-10-CM | POA: Diagnosis not present

## 2016-04-03 DIAGNOSIS — M8700 Idiopathic aseptic necrosis of unspecified bone: Secondary | ICD-10-CM | POA: Diagnosis not present

## 2016-04-03 DIAGNOSIS — M109 Gout, unspecified: Secondary | ICD-10-CM | POA: Diagnosis not present

## 2016-04-03 DIAGNOSIS — Z8673 Personal history of transient ischemic attack (TIA), and cerebral infarction without residual deficits: Secondary | ICD-10-CM | POA: Diagnosis not present

## 2016-04-03 DIAGNOSIS — M3214 Glomerular disease in systemic lupus erythematosus: Secondary | ICD-10-CM | POA: Diagnosis not present

## 2016-04-03 DIAGNOSIS — Z471 Aftercare following joint replacement surgery: Secondary | ICD-10-CM | POA: Diagnosis not present

## 2016-04-11 DIAGNOSIS — I509 Heart failure, unspecified: Secondary | ICD-10-CM | POA: Diagnosis not present

## 2016-04-11 DIAGNOSIS — M3214 Glomerular disease in systemic lupus erythematosus: Secondary | ICD-10-CM | POA: Diagnosis not present

## 2016-04-11 DIAGNOSIS — M25562 Pain in left knee: Secondary | ICD-10-CM | POA: Diagnosis not present

## 2016-04-11 DIAGNOSIS — M6281 Muscle weakness (generalized): Secondary | ICD-10-CM | POA: Diagnosis not present

## 2016-04-11 DIAGNOSIS — I2510 Atherosclerotic heart disease of native coronary artery without angina pectoris: Secondary | ICD-10-CM | POA: Diagnosis not present

## 2016-04-11 DIAGNOSIS — F329 Major depressive disorder, single episode, unspecified: Secondary | ICD-10-CM | POA: Diagnosis not present

## 2016-04-11 DIAGNOSIS — I252 Old myocardial infarction: Secondary | ICD-10-CM | POA: Diagnosis not present

## 2016-04-11 DIAGNOSIS — M109 Gout, unspecified: Secondary | ICD-10-CM | POA: Diagnosis not present

## 2016-04-11 DIAGNOSIS — M8700 Idiopathic aseptic necrosis of unspecified bone: Secondary | ICD-10-CM | POA: Diagnosis not present

## 2016-04-11 DIAGNOSIS — M25519 Pain in unspecified shoulder: Secondary | ICD-10-CM | POA: Diagnosis not present

## 2016-04-20 DIAGNOSIS — M25519 Pain in unspecified shoulder: Secondary | ICD-10-CM | POA: Diagnosis not present

## 2016-04-20 DIAGNOSIS — I252 Old myocardial infarction: Secondary | ICD-10-CM | POA: Diagnosis not present

## 2016-04-20 DIAGNOSIS — I509 Heart failure, unspecified: Secondary | ICD-10-CM | POA: Diagnosis not present

## 2016-04-20 DIAGNOSIS — M00862 Arthritis due to other bacteria, left knee: Secondary | ICD-10-CM | POA: Diagnosis not present

## 2016-04-20 DIAGNOSIS — M6281 Muscle weakness (generalized): Secondary | ICD-10-CM | POA: Diagnosis not present

## 2016-04-20 DIAGNOSIS — G894 Chronic pain syndrome: Secondary | ICD-10-CM | POA: Diagnosis not present

## 2016-04-20 DIAGNOSIS — M109 Gout, unspecified: Secondary | ICD-10-CM | POA: Diagnosis not present

## 2016-04-20 DIAGNOSIS — I2510 Atherosclerotic heart disease of native coronary artery without angina pectoris: Secondary | ICD-10-CM | POA: Diagnosis not present

## 2016-04-20 DIAGNOSIS — M8700 Idiopathic aseptic necrosis of unspecified bone: Secondary | ICD-10-CM | POA: Diagnosis not present

## 2016-04-20 DIAGNOSIS — F329 Major depressive disorder, single episode, unspecified: Secondary | ICD-10-CM | POA: Diagnosis not present

## 2016-04-20 DIAGNOSIS — D641 Secondary sideroblastic anemia due to disease: Secondary | ICD-10-CM | POA: Diagnosis not present

## 2016-04-20 DIAGNOSIS — N183 Chronic kidney disease, stage 3 (moderate): Secondary | ICD-10-CM | POA: Diagnosis not present

## 2016-04-20 DIAGNOSIS — M3214 Glomerular disease in systemic lupus erythematosus: Secondary | ICD-10-CM | POA: Diagnosis not present

## 2016-04-20 DIAGNOSIS — M25562 Pain in left knee: Secondary | ICD-10-CM | POA: Diagnosis not present

## 2016-04-24 DIAGNOSIS — Z87891 Personal history of nicotine dependence: Secondary | ICD-10-CM | POA: Diagnosis not present

## 2016-04-24 DIAGNOSIS — T8459XD Infection and inflammatory reaction due to other internal joint prosthesis, subsequent encounter: Secondary | ICD-10-CM | POA: Diagnosis not present

## 2016-04-24 DIAGNOSIS — Z96642 Presence of left artificial hip joint: Secondary | ICD-10-CM | POA: Diagnosis not present

## 2016-04-24 DIAGNOSIS — E785 Hyperlipidemia, unspecified: Secondary | ICD-10-CM | POA: Diagnosis not present

## 2016-04-24 DIAGNOSIS — X58XXXD Exposure to other specified factors, subsequent encounter: Secondary | ICD-10-CM | POA: Diagnosis not present

## 2016-04-24 DIAGNOSIS — I2510 Atherosclerotic heart disease of native coronary artery without angina pectoris: Secondary | ICD-10-CM | POA: Diagnosis not present

## 2016-04-24 DIAGNOSIS — Z96652 Presence of left artificial knee joint: Secondary | ICD-10-CM | POA: Diagnosis not present

## 2016-04-24 DIAGNOSIS — Z96659 Presence of unspecified artificial knee joint: Secondary | ICD-10-CM | POA: Diagnosis not present

## 2016-05-01 ENCOUNTER — Other Ambulatory Visit: Payer: Self-pay | Admitting: *Deleted

## 2016-05-05 ENCOUNTER — Encounter: Payer: Self-pay | Admitting: *Deleted

## 2016-05-05 ENCOUNTER — Other Ambulatory Visit: Payer: Self-pay | Admitting: *Deleted

## 2016-05-05 NOTE — Patient Outreach (Signed)
[place] [facility]) Care Management  05/05/2016  Marquisha Nikolov Dehart [birth year] [id number]   CSW contacted patient who is preparing for her surgery at [facility] in March.  Advised her of plans to close CSW referral at this time and will await post op consult if needs arise. "I plan to go to [facility] for rehab afterwards- encouraged her to call SNF and let them know of her interest close to the surgery date to ensure they are aware and can hopefully accommodate her needs at that time.  CSW will close referral and advise [facility] team and PCP.    [name], MSW, [place] Worker  [place] [phone]

## 2016-05-17 DIAGNOSIS — I129 Hypertensive chronic kidney disease with stage 1 through stage 4 chronic kidney disease, or unspecified chronic kidney disease: Secondary | ICD-10-CM | POA: Diagnosis not present

## 2016-05-17 DIAGNOSIS — T8459XD Infection and inflammatory reaction due to other internal joint prosthesis, subsequent encounter: Secondary | ICD-10-CM | POA: Diagnosis not present

## 2016-05-17 DIAGNOSIS — G8929 Other chronic pain: Secondary | ICD-10-CM | POA: Diagnosis not present

## 2016-05-17 DIAGNOSIS — Y792 Prosthetic and other implants, materials and accessory orthopedic devices associated with adverse incidents: Secondary | ICD-10-CM | POA: Diagnosis not present

## 2016-05-17 DIAGNOSIS — T8454XA Infection and inflammatory reaction due to internal left knee prosthesis, initial encounter: Secondary | ICD-10-CM | POA: Diagnosis not present

## 2016-05-17 DIAGNOSIS — M3214 Glomerular disease in systemic lupus erythematosus: Secondary | ICD-10-CM | POA: Diagnosis not present

## 2016-05-17 DIAGNOSIS — I2510 Atherosclerotic heart disease of native coronary artery without angina pectoris: Secondary | ICD-10-CM | POA: Diagnosis not present

## 2016-05-17 DIAGNOSIS — F329 Major depressive disorder, single episode, unspecified: Secondary | ICD-10-CM | POA: Diagnosis not present

## 2016-05-17 DIAGNOSIS — T84033A Mechanical loosening of internal left knee prosthetic joint, initial encounter: Secondary | ICD-10-CM | POA: Diagnosis not present

## 2016-05-17 DIAGNOSIS — D62 Acute posthemorrhagic anemia: Secondary | ICD-10-CM | POA: Diagnosis not present

## 2016-05-17 DIAGNOSIS — E039 Hypothyroidism, unspecified: Secondary | ICD-10-CM | POA: Diagnosis not present

## 2016-05-17 DIAGNOSIS — I252 Old myocardial infarction: Secondary | ICD-10-CM | POA: Diagnosis not present

## 2016-05-17 DIAGNOSIS — I739 Peripheral vascular disease, unspecified: Secondary | ICD-10-CM | POA: Diagnosis not present

## 2016-05-17 DIAGNOSIS — N179 Acute kidney failure, unspecified: Secondary | ICD-10-CM | POA: Diagnosis not present

## 2016-05-17 DIAGNOSIS — Z96652 Presence of left artificial knee joint: Secondary | ICD-10-CM | POA: Diagnosis not present

## 2016-05-17 DIAGNOSIS — X58XXXD Exposure to other specified factors, subsequent encounter: Secondary | ICD-10-CM | POA: Diagnosis not present

## 2016-05-17 DIAGNOSIS — K219 Gastro-esophageal reflux disease without esophagitis: Secondary | ICD-10-CM | POA: Diagnosis not present

## 2016-05-17 DIAGNOSIS — I69354 Hemiplegia and hemiparesis following cerebral infarction affecting left non-dominant side: Secondary | ICD-10-CM | POA: Diagnosis not present

## 2016-05-17 DIAGNOSIS — F419 Anxiety disorder, unspecified: Secondary | ICD-10-CM | POA: Diagnosis not present

## 2016-05-17 DIAGNOSIS — Z951 Presence of aortocoronary bypass graft: Secondary | ICD-10-CM | POA: Diagnosis not present

## 2016-05-17 DIAGNOSIS — D638 Anemia in other chronic diseases classified elsewhere: Secondary | ICD-10-CM | POA: Diagnosis not present

## 2016-05-17 DIAGNOSIS — I7300 Raynaud's syndrome without gangrene: Secondary | ICD-10-CM | POA: Diagnosis not present

## 2016-05-22 DIAGNOSIS — I252 Old myocardial infarction: Secondary | ICD-10-CM | POA: Diagnosis not present

## 2016-05-22 DIAGNOSIS — I10 Essential (primary) hypertension: Secondary | ICD-10-CM | POA: Diagnosis not present

## 2016-05-22 DIAGNOSIS — M25562 Pain in left knee: Secondary | ICD-10-CM | POA: Diagnosis not present

## 2016-05-22 DIAGNOSIS — F329 Major depressive disorder, single episode, unspecified: Secondary | ICD-10-CM | POA: Diagnosis not present

## 2016-05-22 DIAGNOSIS — I509 Heart failure, unspecified: Secondary | ICD-10-CM | POA: Diagnosis not present

## 2016-05-22 DIAGNOSIS — M8700 Idiopathic aseptic necrosis of unspecified bone: Secondary | ICD-10-CM | POA: Diagnosis not present

## 2016-05-22 DIAGNOSIS — R809 Proteinuria, unspecified: Secondary | ICD-10-CM | POA: Diagnosis not present

## 2016-05-22 DIAGNOSIS — M3214 Glomerular disease in systemic lupus erythematosus: Secondary | ICD-10-CM | POA: Diagnosis not present

## 2016-05-22 DIAGNOSIS — I2510 Atherosclerotic heart disease of native coronary artery without angina pectoris: Secondary | ICD-10-CM | POA: Diagnosis not present

## 2016-05-22 DIAGNOSIS — N183 Chronic kidney disease, stage 3 (moderate): Secondary | ICD-10-CM | POA: Diagnosis not present

## 2016-05-22 DIAGNOSIS — M6281 Muscle weakness (generalized): Secondary | ICD-10-CM | POA: Diagnosis not present

## 2016-05-22 DIAGNOSIS — M25519 Pain in unspecified shoulder: Secondary | ICD-10-CM | POA: Diagnosis not present

## 2016-05-22 DIAGNOSIS — N390 Urinary tract infection, site not specified: Secondary | ICD-10-CM | POA: Diagnosis not present

## 2016-05-22 DIAGNOSIS — M109 Gout, unspecified: Secondary | ICD-10-CM | POA: Diagnosis not present

## 2016-05-22 DIAGNOSIS — D631 Anemia in chronic kidney disease: Secondary | ICD-10-CM | POA: Diagnosis not present

## 2016-05-26 DIAGNOSIS — M25561 Pain in right knee: Secondary | ICD-10-CM | POA: Diagnosis not present

## 2016-05-26 DIAGNOSIS — M00862 Arthritis due to other bacteria, left knee: Secondary | ICD-10-CM | POA: Diagnosis not present

## 2016-05-26 DIAGNOSIS — M25562 Pain in left knee: Secondary | ICD-10-CM | POA: Diagnosis not present

## 2016-05-30 DIAGNOSIS — N179 Acute kidney failure, unspecified: Secondary | ICD-10-CM | POA: Diagnosis not present

## 2016-05-30 DIAGNOSIS — I2510 Atherosclerotic heart disease of native coronary artery without angina pectoris: Secondary | ICD-10-CM | POA: Diagnosis not present

## 2016-05-30 DIAGNOSIS — G8918 Other acute postprocedural pain: Secondary | ICD-10-CM | POA: Diagnosis not present

## 2016-05-30 DIAGNOSIS — F419 Anxiety disorder, unspecified: Secondary | ICD-10-CM | POA: Diagnosis not present

## 2016-05-30 DIAGNOSIS — I739 Peripheral vascular disease, unspecified: Secondary | ICD-10-CM | POA: Diagnosis not present

## 2016-05-30 DIAGNOSIS — T8454XA Infection and inflammatory reaction due to internal left knee prosthesis, initial encounter: Secondary | ICD-10-CM | POA: Diagnosis not present

## 2016-05-30 DIAGNOSIS — K219 Gastro-esophageal reflux disease without esophagitis: Secondary | ICD-10-CM | POA: Diagnosis not present

## 2016-05-30 DIAGNOSIS — Y792 Prosthetic and other implants, materials and accessory orthopedic devices associated with adverse incidents: Secondary | ICD-10-CM | POA: Diagnosis not present

## 2016-05-30 DIAGNOSIS — T8459XD Infection and inflammatory reaction due to other internal joint prosthesis, subsequent encounter: Secondary | ICD-10-CM | POA: Diagnosis not present

## 2016-05-30 DIAGNOSIS — I252 Old myocardial infarction: Secondary | ICD-10-CM | POA: Diagnosis not present

## 2016-05-30 DIAGNOSIS — T84033A Mechanical loosening of internal left knee prosthetic joint, initial encounter: Secondary | ICD-10-CM | POA: Diagnosis not present

## 2016-05-30 DIAGNOSIS — E039 Hypothyroidism, unspecified: Secondary | ICD-10-CM | POA: Diagnosis not present

## 2016-05-30 DIAGNOSIS — D638 Anemia in other chronic diseases classified elsewhere: Secondary | ICD-10-CM | POA: Diagnosis not present

## 2016-05-30 DIAGNOSIS — I7300 Raynaud's syndrome without gangrene: Secondary | ICD-10-CM | POA: Diagnosis not present

## 2016-05-30 DIAGNOSIS — G8929 Other chronic pain: Secondary | ICD-10-CM | POA: Diagnosis not present

## 2016-05-30 DIAGNOSIS — Z951 Presence of aortocoronary bypass graft: Secondary | ICD-10-CM | POA: Diagnosis not present

## 2016-05-30 DIAGNOSIS — D62 Acute posthemorrhagic anemia: Secondary | ICD-10-CM | POA: Diagnosis not present

## 2016-05-30 DIAGNOSIS — M3214 Glomerular disease in systemic lupus erythematosus: Secondary | ICD-10-CM | POA: Diagnosis not present

## 2016-05-30 DIAGNOSIS — I129 Hypertensive chronic kidney disease with stage 1 through stage 4 chronic kidney disease, or unspecified chronic kidney disease: Secondary | ICD-10-CM | POA: Diagnosis not present

## 2016-05-30 DIAGNOSIS — M25562 Pain in left knee: Secondary | ICD-10-CM | POA: Diagnosis not present

## 2016-05-30 DIAGNOSIS — Z96652 Presence of left artificial knee joint: Secondary | ICD-10-CM | POA: Diagnosis not present

## 2016-05-30 DIAGNOSIS — I69354 Hemiplegia and hemiparesis following cerebral infarction affecting left non-dominant side: Secondary | ICD-10-CM | POA: Diagnosis not present

## 2016-05-30 DIAGNOSIS — Z96659 Presence of unspecified artificial knee joint: Secondary | ICD-10-CM | POA: Diagnosis not present

## 2016-05-30 DIAGNOSIS — F329 Major depressive disorder, single episode, unspecified: Secondary | ICD-10-CM | POA: Diagnosis not present

## 2016-05-31 DIAGNOSIS — Z96659 Presence of unspecified artificial knee joint: Secondary | ICD-10-CM | POA: Diagnosis not present

## 2016-05-31 DIAGNOSIS — D638 Anemia in other chronic diseases classified elsewhere: Secondary | ICD-10-CM | POA: Diagnosis not present

## 2016-05-31 DIAGNOSIS — M25562 Pain in left knee: Secondary | ICD-10-CM | POA: Diagnosis not present

## 2016-05-31 DIAGNOSIS — T8459XD Infection and inflammatory reaction due to other internal joint prosthesis, subsequent encounter: Secondary | ICD-10-CM | POA: Diagnosis not present

## 2016-05-31 DIAGNOSIS — E039 Hypothyroidism, unspecified: Secondary | ICD-10-CM | POA: Diagnosis not present

## 2016-05-31 DIAGNOSIS — I25810 Atherosclerosis of coronary artery bypass graft(s) without angina pectoris: Secondary | ICD-10-CM | POA: Diagnosis not present

## 2016-05-31 DIAGNOSIS — I150 Renovascular hypertension: Secondary | ICD-10-CM | POA: Diagnosis not present

## 2016-05-31 DIAGNOSIS — T8454XA Infection and inflammatory reaction due to internal left knee prosthesis, initial encounter: Secondary | ICD-10-CM | POA: Diagnosis not present

## 2016-05-31 DIAGNOSIS — M3214 Glomerular disease in systemic lupus erythematosus: Secondary | ICD-10-CM | POA: Diagnosis not present

## 2016-05-31 DIAGNOSIS — N184 Chronic kidney disease, stage 4 (severe): Secondary | ICD-10-CM | POA: Diagnosis not present

## 2016-05-31 DIAGNOSIS — Z9889 Other specified postprocedural states: Secondary | ICD-10-CM | POA: Diagnosis not present

## 2016-05-31 DIAGNOSIS — K219 Gastro-esophageal reflux disease without esophagitis: Secondary | ICD-10-CM | POA: Diagnosis not present

## 2016-05-31 DIAGNOSIS — F418 Other specified anxiety disorders: Secondary | ICD-10-CM | POA: Diagnosis not present

## 2016-06-01 DIAGNOSIS — T8459XD Infection and inflammatory reaction due to other internal joint prosthesis, subsequent encounter: Secondary | ICD-10-CM | POA: Diagnosis not present

## 2016-06-01 DIAGNOSIS — T8454XA Infection and inflammatory reaction due to internal left knee prosthesis, initial encounter: Secondary | ICD-10-CM | POA: Diagnosis not present

## 2016-06-01 DIAGNOSIS — M3214 Glomerular disease in systemic lupus erythematosus: Secondary | ICD-10-CM | POA: Diagnosis not present

## 2016-06-01 DIAGNOSIS — Z96652 Presence of left artificial knee joint: Secondary | ICD-10-CM | POA: Diagnosis not present

## 2016-06-01 DIAGNOSIS — D638 Anemia in other chronic diseases classified elsewhere: Secondary | ICD-10-CM | POA: Diagnosis not present

## 2016-06-01 DIAGNOSIS — Z951 Presence of aortocoronary bypass graft: Secondary | ICD-10-CM | POA: Diagnosis not present

## 2016-06-01 DIAGNOSIS — F418 Other specified anxiety disorders: Secondary | ICD-10-CM | POA: Diagnosis not present

## 2016-06-01 DIAGNOSIS — I129 Hypertensive chronic kidney disease with stage 1 through stage 4 chronic kidney disease, or unspecified chronic kidney disease: Secondary | ICD-10-CM | POA: Diagnosis not present

## 2016-06-01 DIAGNOSIS — E039 Hypothyroidism, unspecified: Secondary | ICD-10-CM | POA: Diagnosis not present

## 2016-06-01 DIAGNOSIS — K219 Gastro-esophageal reflux disease without esophagitis: Secondary | ICD-10-CM | POA: Diagnosis not present

## 2016-06-01 DIAGNOSIS — I25810 Atherosclerosis of coronary artery bypass graft(s) without angina pectoris: Secondary | ICD-10-CM | POA: Diagnosis not present

## 2016-06-01 DIAGNOSIS — N189 Chronic kidney disease, unspecified: Secondary | ICD-10-CM | POA: Diagnosis not present

## 2016-06-02 DIAGNOSIS — Z959 Presence of cardiac and vascular implant and graft, unspecified: Secondary | ICD-10-CM | POA: Diagnosis not present

## 2016-06-02 DIAGNOSIS — I2510 Atherosclerotic heart disease of native coronary artery without angina pectoris: Secondary | ICD-10-CM | POA: Diagnosis not present

## 2016-06-02 DIAGNOSIS — D638 Anemia in other chronic diseases classified elsewhere: Secondary | ICD-10-CM | POA: Diagnosis not present

## 2016-06-02 DIAGNOSIS — F418 Other specified anxiety disorders: Secondary | ICD-10-CM | POA: Diagnosis not present

## 2016-06-02 DIAGNOSIS — T8454XA Infection and inflammatory reaction due to internal left knee prosthesis, initial encounter: Secondary | ICD-10-CM | POA: Diagnosis not present

## 2016-06-02 DIAGNOSIS — E039 Hypothyroidism, unspecified: Secondary | ICD-10-CM | POA: Diagnosis not present

## 2016-06-02 DIAGNOSIS — M329 Systemic lupus erythematosus, unspecified: Secondary | ICD-10-CM | POA: Diagnosis not present

## 2016-06-02 DIAGNOSIS — I7300 Raynaud's syndrome without gangrene: Secondary | ICD-10-CM | POA: Diagnosis not present

## 2016-06-02 DIAGNOSIS — Z8673 Personal history of transient ischemic attack (TIA), and cerebral infarction without residual deficits: Secondary | ICD-10-CM | POA: Diagnosis not present

## 2016-06-02 DIAGNOSIS — M3214 Glomerular disease in systemic lupus erythematosus: Secondary | ICD-10-CM | POA: Diagnosis not present

## 2016-06-02 DIAGNOSIS — Z96651 Presence of right artificial knee joint: Secondary | ICD-10-CM | POA: Diagnosis not present

## 2016-06-02 DIAGNOSIS — I150 Renovascular hypertension: Secondary | ICD-10-CM | POA: Diagnosis not present

## 2016-06-02 DIAGNOSIS — Z9889 Other specified postprocedural states: Secondary | ICD-10-CM | POA: Diagnosis not present

## 2016-06-02 DIAGNOSIS — K219 Gastro-esophageal reflux disease without esophagitis: Secondary | ICD-10-CM | POA: Diagnosis not present

## 2016-06-02 DIAGNOSIS — D649 Anemia, unspecified: Secondary | ICD-10-CM | POA: Diagnosis not present

## 2016-06-02 DIAGNOSIS — I25810 Atherosclerosis of coronary artery bypass graft(s) without angina pectoris: Secondary | ICD-10-CM | POA: Diagnosis not present

## 2016-06-02 DIAGNOSIS — I509 Heart failure, unspecified: Secondary | ICD-10-CM | POA: Diagnosis not present

## 2016-06-02 DIAGNOSIS — M1990 Unspecified osteoarthritis, unspecified site: Secondary | ICD-10-CM | POA: Diagnosis not present

## 2016-06-02 DIAGNOSIS — N184 Chronic kidney disease, stage 4 (severe): Secondary | ICD-10-CM | POA: Diagnosis not present

## 2016-06-03 DIAGNOSIS — K219 Gastro-esophageal reflux disease without esophagitis: Secondary | ICD-10-CM | POA: Diagnosis not present

## 2016-06-03 DIAGNOSIS — N184 Chronic kidney disease, stage 4 (severe): Secondary | ICD-10-CM | POA: Diagnosis not present

## 2016-06-03 DIAGNOSIS — T8454XA Infection and inflammatory reaction due to internal left knee prosthesis, initial encounter: Secondary | ICD-10-CM | POA: Diagnosis not present

## 2016-06-03 DIAGNOSIS — I150 Renovascular hypertension: Secondary | ICD-10-CM | POA: Diagnosis not present

## 2016-06-03 DIAGNOSIS — F418 Other specified anxiety disorders: Secondary | ICD-10-CM | POA: Diagnosis not present

## 2016-06-03 DIAGNOSIS — I25810 Atherosclerosis of coronary artery bypass graft(s) without angina pectoris: Secondary | ICD-10-CM | POA: Diagnosis not present

## 2016-06-03 DIAGNOSIS — M3214 Glomerular disease in systemic lupus erythematosus: Secondary | ICD-10-CM | POA: Diagnosis not present

## 2016-06-03 DIAGNOSIS — E039 Hypothyroidism, unspecified: Secondary | ICD-10-CM | POA: Diagnosis not present

## 2016-06-03 DIAGNOSIS — Z9889 Other specified postprocedural states: Secondary | ICD-10-CM | POA: Diagnosis not present

## 2016-06-03 DIAGNOSIS — D638 Anemia in other chronic diseases classified elsewhere: Secondary | ICD-10-CM | POA: Diagnosis not present

## 2016-06-04 DIAGNOSIS — N184 Chronic kidney disease, stage 4 (severe): Secondary | ICD-10-CM | POA: Diagnosis not present

## 2016-06-04 DIAGNOSIS — E039 Hypothyroidism, unspecified: Secondary | ICD-10-CM | POA: Diagnosis not present

## 2016-06-04 DIAGNOSIS — D638 Anemia in other chronic diseases classified elsewhere: Secondary | ICD-10-CM | POA: Diagnosis not present

## 2016-06-04 DIAGNOSIS — K219 Gastro-esophageal reflux disease without esophagitis: Secondary | ICD-10-CM | POA: Diagnosis not present

## 2016-06-04 DIAGNOSIS — F418 Other specified anxiety disorders: Secondary | ICD-10-CM | POA: Diagnosis not present

## 2016-06-04 DIAGNOSIS — I25810 Atherosclerosis of coronary artery bypass graft(s) without angina pectoris: Secondary | ICD-10-CM | POA: Diagnosis not present

## 2016-06-04 DIAGNOSIS — I150 Renovascular hypertension: Secondary | ICD-10-CM | POA: Diagnosis not present

## 2016-06-04 DIAGNOSIS — M3214 Glomerular disease in systemic lupus erythematosus: Secondary | ICD-10-CM | POA: Diagnosis not present

## 2016-06-04 DIAGNOSIS — Z9889 Other specified postprocedural states: Secondary | ICD-10-CM | POA: Diagnosis not present

## 2016-06-04 DIAGNOSIS — T8454XA Infection and inflammatory reaction due to internal left knee prosthesis, initial encounter: Secondary | ICD-10-CM | POA: Diagnosis not present

## 2016-06-05 DIAGNOSIS — B957 Other staphylococcus as the cause of diseases classified elsewhere: Secondary | ICD-10-CM | POA: Diagnosis not present

## 2016-06-05 DIAGNOSIS — T8454XA Infection and inflammatory reaction due to internal left knee prosthesis, initial encounter: Secondary | ICD-10-CM | POA: Diagnosis not present

## 2016-06-05 DIAGNOSIS — I129 Hypertensive chronic kidney disease with stage 1 through stage 4 chronic kidney disease, or unspecified chronic kidney disease: Secondary | ICD-10-CM | POA: Diagnosis not present

## 2016-06-05 DIAGNOSIS — N189 Chronic kidney disease, unspecified: Secondary | ICD-10-CM | POA: Diagnosis not present

## 2016-06-05 DIAGNOSIS — M3214 Glomerular disease in systemic lupus erythematosus: Secondary | ICD-10-CM | POA: Diagnosis not present

## 2016-06-05 DIAGNOSIS — E039 Hypothyroidism, unspecified: Secondary | ICD-10-CM | POA: Diagnosis not present

## 2016-06-05 DIAGNOSIS — D638 Anemia in other chronic diseases classified elsewhere: Secondary | ICD-10-CM | POA: Diagnosis not present

## 2016-06-05 DIAGNOSIS — I25810 Atherosclerosis of coronary artery bypass graft(s) without angina pectoris: Secondary | ICD-10-CM | POA: Diagnosis not present

## 2016-06-05 DIAGNOSIS — M00062 Staphylococcal arthritis, left knee: Secondary | ICD-10-CM | POA: Diagnosis not present

## 2016-06-05 DIAGNOSIS — Z959 Presence of cardiac and vascular implant and graft, unspecified: Secondary | ICD-10-CM | POA: Diagnosis not present

## 2016-06-05 DIAGNOSIS — Z96652 Presence of left artificial knee joint: Secondary | ICD-10-CM | POA: Diagnosis not present

## 2016-06-05 DIAGNOSIS — Z96651 Presence of right artificial knee joint: Secondary | ICD-10-CM | POA: Diagnosis not present

## 2016-06-05 DIAGNOSIS — Z951 Presence of aortocoronary bypass graft: Secondary | ICD-10-CM | POA: Diagnosis not present

## 2016-06-05 DIAGNOSIS — K219 Gastro-esophageal reflux disease without esophagitis: Secondary | ICD-10-CM | POA: Diagnosis not present

## 2016-06-05 DIAGNOSIS — F418 Other specified anxiety disorders: Secondary | ICD-10-CM | POA: Diagnosis not present

## 2016-06-06 DIAGNOSIS — F418 Other specified anxiety disorders: Secondary | ICD-10-CM | POA: Diagnosis not present

## 2016-06-06 DIAGNOSIS — D638 Anemia in other chronic diseases classified elsewhere: Secondary | ICD-10-CM | POA: Diagnosis not present

## 2016-06-06 DIAGNOSIS — N189 Chronic kidney disease, unspecified: Secondary | ICD-10-CM | POA: Diagnosis not present

## 2016-06-06 DIAGNOSIS — Z951 Presence of aortocoronary bypass graft: Secondary | ICD-10-CM | POA: Diagnosis not present

## 2016-06-06 DIAGNOSIS — I129 Hypertensive chronic kidney disease with stage 1 through stage 4 chronic kidney disease, or unspecified chronic kidney disease: Secondary | ICD-10-CM | POA: Diagnosis not present

## 2016-06-06 DIAGNOSIS — T8454XA Infection and inflammatory reaction due to internal left knee prosthesis, initial encounter: Secondary | ICD-10-CM | POA: Diagnosis not present

## 2016-06-06 DIAGNOSIS — Z96659 Presence of unspecified artificial knee joint: Secondary | ICD-10-CM | POA: Diagnosis not present

## 2016-06-06 DIAGNOSIS — E039 Hypothyroidism, unspecified: Secondary | ICD-10-CM | POA: Diagnosis not present

## 2016-06-06 DIAGNOSIS — M3214 Glomerular disease in systemic lupus erythematosus: Secondary | ICD-10-CM | POA: Diagnosis not present

## 2016-06-06 DIAGNOSIS — I25810 Atherosclerosis of coronary artery bypass graft(s) without angina pectoris: Secondary | ICD-10-CM | POA: Diagnosis not present

## 2016-06-07 DIAGNOSIS — N179 Acute kidney failure, unspecified: Secondary | ICD-10-CM | POA: Diagnosis not present

## 2016-06-07 DIAGNOSIS — I252 Old myocardial infarction: Secondary | ICD-10-CM | POA: Diagnosis not present

## 2016-06-07 DIAGNOSIS — X58XXXA Exposure to other specified factors, initial encounter: Secondary | ICD-10-CM | POA: Diagnosis not present

## 2016-06-07 DIAGNOSIS — D649 Anemia, unspecified: Secondary | ICD-10-CM | POA: Diagnosis not present

## 2016-06-07 DIAGNOSIS — Z471 Aftercare following joint replacement surgery: Secondary | ICD-10-CM | POA: Diagnosis not present

## 2016-06-07 DIAGNOSIS — E785 Hyperlipidemia, unspecified: Secondary | ICD-10-CM | POA: Diagnosis not present

## 2016-06-07 DIAGNOSIS — Y831 Surgical operation with implant of artificial internal device as the cause of abnormal reaction of the patient, or of later complication, without mention of misadventure at the time of the procedure: Secondary | ICD-10-CM | POA: Diagnosis not present

## 2016-06-07 DIAGNOSIS — N183 Chronic kidney disease, stage 3 (moderate): Secondary | ICD-10-CM | POA: Diagnosis not present

## 2016-06-07 DIAGNOSIS — K219 Gastro-esophageal reflux disease without esophagitis: Secondary | ICD-10-CM | POA: Diagnosis not present

## 2016-06-07 DIAGNOSIS — F418 Other specified anxiety disorders: Secondary | ICD-10-CM | POA: Diagnosis not present

## 2016-06-07 DIAGNOSIS — B3749 Other urogenital candidiasis: Secondary | ICD-10-CM | POA: Diagnosis not present

## 2016-06-07 DIAGNOSIS — Z8619 Personal history of other infectious and parasitic diseases: Secondary | ICD-10-CM | POA: Diagnosis not present

## 2016-06-07 DIAGNOSIS — Z9660 Presence of unspecified orthopedic joint implant: Secondary | ICD-10-CM | POA: Diagnosis not present

## 2016-06-07 DIAGNOSIS — R7889 Finding of other specified substances, not normally found in blood: Secondary | ICD-10-CM | POA: Diagnosis not present

## 2016-06-07 DIAGNOSIS — T8454XD Infection and inflammatory reaction due to internal left knee prosthesis, subsequent encounter: Secondary | ICD-10-CM | POA: Diagnosis not present

## 2016-06-07 DIAGNOSIS — Z951 Presence of aortocoronary bypass graft: Secondary | ICD-10-CM | POA: Diagnosis not present

## 2016-06-07 DIAGNOSIS — L270 Generalized skin eruption due to drugs and medicaments taken internally: Secondary | ICD-10-CM | POA: Diagnosis not present

## 2016-06-07 DIAGNOSIS — Z96652 Presence of left artificial knee joint: Secondary | ICD-10-CM | POA: Diagnosis not present

## 2016-06-07 DIAGNOSIS — F419 Anxiety disorder, unspecified: Secondary | ICD-10-CM | POA: Diagnosis not present

## 2016-06-07 DIAGNOSIS — M3214 Glomerular disease in systemic lupus erythematosus: Secondary | ICD-10-CM | POA: Diagnosis not present

## 2016-06-07 DIAGNOSIS — N184 Chronic kidney disease, stage 4 (severe): Secondary | ICD-10-CM | POA: Diagnosis not present

## 2016-06-07 DIAGNOSIS — Z66 Do not resuscitate: Secondary | ICD-10-CM | POA: Diagnosis not present

## 2016-06-07 DIAGNOSIS — R319 Hematuria, unspecified: Secondary | ICD-10-CM | POA: Diagnosis not present

## 2016-06-07 DIAGNOSIS — Z888 Allergy status to other drugs, medicaments and biological substances status: Secondary | ICD-10-CM | POA: Diagnosis not present

## 2016-06-07 DIAGNOSIS — B373 Candidiasis of vulva and vagina: Secondary | ICD-10-CM | POA: Diagnosis not present

## 2016-06-07 DIAGNOSIS — Z79899 Other long term (current) drug therapy: Secondary | ICD-10-CM | POA: Diagnosis not present

## 2016-06-07 DIAGNOSIS — M8700 Idiopathic aseptic necrosis of unspecified bone: Secondary | ICD-10-CM | POA: Diagnosis not present

## 2016-06-07 DIAGNOSIS — L930 Discoid lupus erythematosus: Secondary | ICD-10-CM | POA: Diagnosis not present

## 2016-06-07 DIAGNOSIS — M25559 Pain in unspecified hip: Secondary | ICD-10-CM | POA: Diagnosis not present

## 2016-06-07 DIAGNOSIS — X58XXXD Exposure to other specified factors, subsequent encounter: Secondary | ICD-10-CM | POA: Diagnosis not present

## 2016-06-07 DIAGNOSIS — Z881 Allergy status to other antibiotic agents status: Secondary | ICD-10-CM | POA: Diagnosis not present

## 2016-06-07 DIAGNOSIS — G8918 Other acute postprocedural pain: Secondary | ICD-10-CM | POA: Diagnosis not present

## 2016-06-07 DIAGNOSIS — F329 Major depressive disorder, single episode, unspecified: Secondary | ICD-10-CM | POA: Diagnosis not present

## 2016-06-07 DIAGNOSIS — Z7901 Long term (current) use of anticoagulants: Secondary | ICD-10-CM | POA: Diagnosis not present

## 2016-06-07 DIAGNOSIS — N170 Acute kidney failure with tubular necrosis: Secondary | ICD-10-CM | POA: Diagnosis not present

## 2016-06-07 DIAGNOSIS — R404 Transient alteration of awareness: Secondary | ICD-10-CM | POA: Diagnosis not present

## 2016-06-07 DIAGNOSIS — Y929 Unspecified place or not applicable: Secondary | ICD-10-CM | POA: Diagnosis not present

## 2016-06-07 DIAGNOSIS — I1310 Hypertensive heart and chronic kidney disease without heart failure, with stage 1 through stage 4 chronic kidney disease, or unspecified chronic kidney disease: Secondary | ICD-10-CM | POA: Diagnosis not present

## 2016-06-07 DIAGNOSIS — Z9889 Other specified postprocedural states: Secondary | ICD-10-CM | POA: Diagnosis not present

## 2016-06-07 DIAGNOSIS — I129 Hypertensive chronic kidney disease with stage 1 through stage 4 chronic kidney disease, or unspecified chronic kidney disease: Secondary | ICD-10-CM | POA: Diagnosis not present

## 2016-06-07 DIAGNOSIS — I2510 Atherosclerotic heart disease of native coronary artery without angina pectoris: Secondary | ICD-10-CM | POA: Diagnosis not present

## 2016-06-07 DIAGNOSIS — Z09 Encounter for follow-up examination after completed treatment for conditions other than malignant neoplasm: Secondary | ICD-10-CM | POA: Diagnosis not present

## 2016-06-07 DIAGNOSIS — I25810 Atherosclerosis of coronary artery bypass graft(s) without angina pectoris: Secondary | ICD-10-CM | POA: Diagnosis not present

## 2016-06-07 DIAGNOSIS — B999 Unspecified infectious disease: Secondary | ICD-10-CM | POA: Diagnosis not present

## 2016-06-07 DIAGNOSIS — T7840XA Allergy, unspecified, initial encounter: Secondary | ICD-10-CM | POA: Diagnosis not present

## 2016-06-07 DIAGNOSIS — N189 Chronic kidney disease, unspecified: Secondary | ICD-10-CM | POA: Diagnosis not present

## 2016-06-07 DIAGNOSIS — Z7409 Other reduced mobility: Secondary | ICD-10-CM | POA: Diagnosis not present

## 2016-06-07 DIAGNOSIS — Z7982 Long term (current) use of aspirin: Secondary | ICD-10-CM | POA: Diagnosis not present

## 2016-06-07 DIAGNOSIS — E860 Dehydration: Secondary | ICD-10-CM | POA: Diagnosis not present

## 2016-06-07 DIAGNOSIS — M868X6 Other osteomyelitis, lower leg: Secondary | ICD-10-CM | POA: Diagnosis not present

## 2016-06-07 DIAGNOSIS — Z882 Allergy status to sulfonamides status: Secondary | ICD-10-CM | POA: Diagnosis not present

## 2016-06-07 DIAGNOSIS — E039 Hypothyroidism, unspecified: Secondary | ICD-10-CM | POA: Diagnosis not present

## 2016-06-07 DIAGNOSIS — I110 Hypertensive heart disease with heart failure: Secondary | ICD-10-CM | POA: Diagnosis not present

## 2016-06-07 DIAGNOSIS — Z792 Long term (current) use of antibiotics: Secondary | ICD-10-CM | POA: Diagnosis not present

## 2016-06-07 DIAGNOSIS — I509 Heart failure, unspecified: Secondary | ICD-10-CM | POA: Diagnosis not present

## 2016-06-07 DIAGNOSIS — R239 Unspecified skin changes: Secondary | ICD-10-CM | POA: Diagnosis not present

## 2016-06-07 DIAGNOSIS — M329 Systemic lupus erythematosus, unspecified: Secondary | ICD-10-CM | POA: Diagnosis not present

## 2016-06-07 DIAGNOSIS — R5383 Other fatigue: Secondary | ICD-10-CM | POA: Diagnosis not present

## 2016-06-07 DIAGNOSIS — T8454XA Infection and inflammatory reaction due to internal left knee prosthesis, initial encounter: Secondary | ICD-10-CM | POA: Diagnosis not present

## 2016-06-07 DIAGNOSIS — Z8673 Personal history of transient ischemic attack (TIA), and cerebral infarction without residual deficits: Secondary | ICD-10-CM | POA: Diagnosis not present

## 2016-06-07 DIAGNOSIS — I10 Essential (primary) hypertension: Secondary | ICD-10-CM | POA: Diagnosis not present

## 2016-06-07 DIAGNOSIS — R21 Rash and other nonspecific skin eruption: Secondary | ICD-10-CM | POA: Diagnosis not present

## 2016-06-07 DIAGNOSIS — D638 Anemia in other chronic diseases classified elsewhere: Secondary | ICD-10-CM | POA: Diagnosis not present

## 2016-06-07 DIAGNOSIS — T368X5A Adverse effect of other systemic antibiotics, initial encounter: Secondary | ICD-10-CM | POA: Diagnosis not present

## 2016-06-07 DIAGNOSIS — B957 Other staphylococcus as the cause of diseases classified elsewhere: Secondary | ICD-10-CM | POA: Diagnosis not present

## 2016-06-07 DIAGNOSIS — Z96659 Presence of unspecified artificial knee joint: Secondary | ICD-10-CM | POA: Diagnosis not present

## 2016-06-08 DIAGNOSIS — N183 Chronic kidney disease, stage 3 (moderate): Secondary | ICD-10-CM | POA: Diagnosis not present

## 2016-06-08 DIAGNOSIS — D649 Anemia, unspecified: Secondary | ICD-10-CM | POA: Diagnosis not present

## 2016-06-08 DIAGNOSIS — Z471 Aftercare following joint replacement surgery: Secondary | ICD-10-CM | POA: Diagnosis not present

## 2016-06-08 DIAGNOSIS — G8918 Other acute postprocedural pain: Secondary | ICD-10-CM | POA: Diagnosis not present

## 2016-06-16 DIAGNOSIS — N184 Chronic kidney disease, stage 4 (severe): Secondary | ICD-10-CM | POA: Diagnosis not present

## 2016-06-16 DIAGNOSIS — T368X5A Adverse effect of other systemic antibiotics, initial encounter: Secondary | ICD-10-CM | POA: Diagnosis not present

## 2016-06-16 DIAGNOSIS — Z792 Long term (current) use of antibiotics: Secondary | ICD-10-CM | POA: Diagnosis not present

## 2016-06-16 DIAGNOSIS — B373 Candidiasis of vulva and vagina: Secondary | ICD-10-CM | POA: Diagnosis not present

## 2016-06-16 DIAGNOSIS — Z9889 Other specified postprocedural states: Secondary | ICD-10-CM | POA: Diagnosis not present

## 2016-06-16 DIAGNOSIS — M329 Systemic lupus erythematosus, unspecified: Secondary | ICD-10-CM | POA: Diagnosis not present

## 2016-06-16 DIAGNOSIS — M868X6 Other osteomyelitis, lower leg: Secondary | ICD-10-CM | POA: Diagnosis not present

## 2016-06-16 DIAGNOSIS — B3749 Other urogenital candidiasis: Secondary | ICD-10-CM | POA: Diagnosis not present

## 2016-06-16 DIAGNOSIS — L270 Generalized skin eruption due to drugs and medicaments taken internally: Secondary | ICD-10-CM | POA: Diagnosis not present

## 2016-06-16 DIAGNOSIS — X58XXXA Exposure to other specified factors, initial encounter: Secondary | ICD-10-CM | POA: Diagnosis not present

## 2016-06-16 DIAGNOSIS — B957 Other staphylococcus as the cause of diseases classified elsewhere: Secondary | ICD-10-CM | POA: Diagnosis not present

## 2016-06-21 DIAGNOSIS — Z96652 Presence of left artificial knee joint: Secondary | ICD-10-CM | POA: Diagnosis not present

## 2016-06-21 DIAGNOSIS — Z471 Aftercare following joint replacement surgery: Secondary | ICD-10-CM | POA: Diagnosis not present

## 2016-06-22 DIAGNOSIS — R21 Rash and other nonspecific skin eruption: Secondary | ICD-10-CM | POA: Diagnosis not present

## 2016-06-22 DIAGNOSIS — X58XXXD Exposure to other specified factors, subsequent encounter: Secondary | ICD-10-CM | POA: Diagnosis not present

## 2016-06-22 DIAGNOSIS — N179 Acute kidney failure, unspecified: Secondary | ICD-10-CM | POA: Diagnosis not present

## 2016-06-22 DIAGNOSIS — Z9889 Other specified postprocedural states: Secondary | ICD-10-CM | POA: Diagnosis not present

## 2016-06-22 DIAGNOSIS — Z881 Allergy status to other antibiotic agents status: Secondary | ICD-10-CM | POA: Diagnosis not present

## 2016-06-22 DIAGNOSIS — Z79899 Other long term (current) drug therapy: Secondary | ICD-10-CM | POA: Diagnosis not present

## 2016-06-22 DIAGNOSIS — Z7982 Long term (current) use of aspirin: Secondary | ICD-10-CM | POA: Diagnosis not present

## 2016-06-22 DIAGNOSIS — Z8673 Personal history of transient ischemic attack (TIA), and cerebral infarction without residual deficits: Secondary | ICD-10-CM | POA: Diagnosis not present

## 2016-06-22 DIAGNOSIS — T8454XD Infection and inflammatory reaction due to internal left knee prosthesis, subsequent encounter: Secondary | ICD-10-CM | POA: Diagnosis not present

## 2016-06-22 DIAGNOSIS — I2510 Atherosclerotic heart disease of native coronary artery without angina pectoris: Secondary | ICD-10-CM | POA: Diagnosis not present

## 2016-06-22 DIAGNOSIS — I509 Heart failure, unspecified: Secondary | ICD-10-CM | POA: Diagnosis not present

## 2016-06-22 DIAGNOSIS — Z96652 Presence of left artificial knee joint: Secondary | ICD-10-CM | POA: Diagnosis not present

## 2016-06-22 DIAGNOSIS — Z888 Allergy status to other drugs, medicaments and biological substances status: Secondary | ICD-10-CM | POA: Diagnosis not present

## 2016-06-22 DIAGNOSIS — Z7901 Long term (current) use of anticoagulants: Secondary | ICD-10-CM | POA: Diagnosis not present

## 2016-06-22 DIAGNOSIS — Z09 Encounter for follow-up examination after completed treatment for conditions other than malignant neoplasm: Secondary | ICD-10-CM | POA: Diagnosis not present

## 2016-06-22 DIAGNOSIS — Z882 Allergy status to sulfonamides status: Secondary | ICD-10-CM | POA: Diagnosis not present

## 2016-06-22 DIAGNOSIS — Z792 Long term (current) use of antibiotics: Secondary | ICD-10-CM | POA: Diagnosis not present

## 2016-06-28 DIAGNOSIS — E785 Hyperlipidemia, unspecified: Secondary | ICD-10-CM | POA: Diagnosis not present

## 2016-06-28 DIAGNOSIS — I252 Old myocardial infarction: Secondary | ICD-10-CM | POA: Diagnosis not present

## 2016-06-28 DIAGNOSIS — Z951 Presence of aortocoronary bypass graft: Secondary | ICD-10-CM | POA: Diagnosis not present

## 2016-06-28 DIAGNOSIS — Y831 Surgical operation with implant of artificial internal device as the cause of abnormal reaction of the patient, or of later complication, without mention of misadventure at the time of the procedure: Secondary | ICD-10-CM | POA: Diagnosis not present

## 2016-06-28 DIAGNOSIS — N179 Acute kidney failure, unspecified: Secondary | ICD-10-CM | POA: Diagnosis not present

## 2016-06-28 DIAGNOSIS — L930 Discoid lupus erythematosus: Secondary | ICD-10-CM | POA: Diagnosis not present

## 2016-06-28 DIAGNOSIS — Y929 Unspecified place or not applicable: Secondary | ICD-10-CM | POA: Diagnosis not present

## 2016-06-28 DIAGNOSIS — I509 Heart failure, unspecified: Secondary | ICD-10-CM | POA: Diagnosis not present

## 2016-06-28 DIAGNOSIS — K219 Gastro-esophageal reflux disease without esophagitis: Secondary | ICD-10-CM | POA: Diagnosis not present

## 2016-06-28 DIAGNOSIS — T8454XA Infection and inflammatory reaction due to internal left knee prosthesis, initial encounter: Secondary | ICD-10-CM | POA: Diagnosis not present

## 2016-06-28 DIAGNOSIS — Z9660 Presence of unspecified orthopedic joint implant: Secondary | ICD-10-CM | POA: Diagnosis not present

## 2016-06-28 DIAGNOSIS — T8454XD Infection and inflammatory reaction due to internal left knee prosthesis, subsequent encounter: Secondary | ICD-10-CM | POA: Diagnosis not present

## 2016-06-28 DIAGNOSIS — M329 Systemic lupus erythematosus, unspecified: Secondary | ICD-10-CM | POA: Diagnosis not present

## 2016-06-28 DIAGNOSIS — Z66 Do not resuscitate: Secondary | ICD-10-CM | POA: Diagnosis not present

## 2016-06-28 DIAGNOSIS — I110 Hypertensive heart disease with heart failure: Secondary | ICD-10-CM | POA: Diagnosis not present

## 2016-06-28 DIAGNOSIS — F419 Anxiety disorder, unspecified: Secondary | ICD-10-CM | POA: Diagnosis not present

## 2016-06-28 DIAGNOSIS — N281 Cyst of kidney, acquired: Secondary | ICD-10-CM | POA: Diagnosis not present

## 2016-06-28 DIAGNOSIS — T368X5A Adverse effect of other systemic antibiotics, initial encounter: Secondary | ICD-10-CM | POA: Diagnosis not present

## 2016-06-28 DIAGNOSIS — I25810 Atherosclerosis of coronary artery bypass graft(s) without angina pectoris: Secondary | ICD-10-CM | POA: Diagnosis not present

## 2016-06-28 DIAGNOSIS — Z7409 Other reduced mobility: Secondary | ICD-10-CM | POA: Diagnosis not present

## 2016-06-28 DIAGNOSIS — R7889 Finding of other specified substances, not normally found in blood: Secondary | ICD-10-CM | POA: Diagnosis not present

## 2016-06-28 DIAGNOSIS — I2510 Atherosclerotic heart disease of native coronary artery without angina pectoris: Secondary | ICD-10-CM | POA: Diagnosis not present

## 2016-06-28 DIAGNOSIS — N183 Chronic kidney disease, stage 3 (moderate): Secondary | ICD-10-CM | POA: Diagnosis not present

## 2016-06-28 DIAGNOSIS — F418 Other specified anxiety disorders: Secondary | ICD-10-CM | POA: Diagnosis not present

## 2016-06-28 DIAGNOSIS — I1310 Hypertensive heart and chronic kidney disease without heart failure, with stage 1 through stage 4 chronic kidney disease, or unspecified chronic kidney disease: Secondary | ICD-10-CM | POA: Diagnosis not present

## 2016-06-28 DIAGNOSIS — I10 Essential (primary) hypertension: Secondary | ICD-10-CM | POA: Diagnosis not present

## 2016-06-28 DIAGNOSIS — Z8619 Personal history of other infectious and parasitic diseases: Secondary | ICD-10-CM | POA: Diagnosis not present

## 2016-06-28 DIAGNOSIS — E039 Hypothyroidism, unspecified: Secondary | ICD-10-CM | POA: Diagnosis not present

## 2016-06-28 DIAGNOSIS — N170 Acute kidney failure with tubular necrosis: Secondary | ICD-10-CM | POA: Diagnosis not present

## 2016-06-28 DIAGNOSIS — R319 Hematuria, unspecified: Secondary | ICD-10-CM | POA: Diagnosis not present

## 2016-06-28 DIAGNOSIS — M25559 Pain in unspecified hip: Secondary | ICD-10-CM | POA: Diagnosis not present

## 2016-06-28 DIAGNOSIS — I129 Hypertensive chronic kidney disease with stage 1 through stage 4 chronic kidney disease, or unspecified chronic kidney disease: Secondary | ICD-10-CM | POA: Diagnosis not present

## 2016-06-28 DIAGNOSIS — E860 Dehydration: Secondary | ICD-10-CM | POA: Diagnosis not present

## 2016-06-28 DIAGNOSIS — T8450XA Infection and inflammatory reaction due to unspecified internal joint prosthesis, initial encounter: Secondary | ICD-10-CM | POA: Diagnosis not present

## 2016-06-28 DIAGNOSIS — I120 Hypertensive chronic kidney disease with stage 5 chronic kidney disease or end stage renal disease: Secondary | ICD-10-CM | POA: Diagnosis not present

## 2016-06-28 DIAGNOSIS — R5383 Other fatigue: Secondary | ICD-10-CM | POA: Diagnosis not present

## 2016-06-28 DIAGNOSIS — N189 Chronic kidney disease, unspecified: Secondary | ICD-10-CM | POA: Diagnosis not present

## 2016-06-28 DIAGNOSIS — M3214 Glomerular disease in systemic lupus erythematosus: Secondary | ICD-10-CM | POA: Diagnosis not present

## 2016-06-28 DIAGNOSIS — M8700 Idiopathic aseptic necrosis of unspecified bone: Secondary | ICD-10-CM | POA: Diagnosis not present

## 2016-06-28 DIAGNOSIS — F329 Major depressive disorder, single episode, unspecified: Secondary | ICD-10-CM | POA: Diagnosis not present

## 2016-06-28 DIAGNOSIS — Z96652 Presence of left artificial knee joint: Secondary | ICD-10-CM | POA: Diagnosis not present

## 2016-06-29 DIAGNOSIS — T8450XA Infection and inflammatory reaction due to unspecified internal joint prosthesis, initial encounter: Secondary | ICD-10-CM | POA: Diagnosis not present

## 2016-06-29 DIAGNOSIS — E039 Hypothyroidism, unspecified: Secondary | ICD-10-CM | POA: Diagnosis not present

## 2016-06-29 DIAGNOSIS — K219 Gastro-esophageal reflux disease without esophagitis: Secondary | ICD-10-CM | POA: Diagnosis not present

## 2016-06-29 DIAGNOSIS — Z96652 Presence of left artificial knee joint: Secondary | ICD-10-CM | POA: Diagnosis not present

## 2016-06-29 DIAGNOSIS — E785 Hyperlipidemia, unspecified: Secondary | ICD-10-CM | POA: Diagnosis not present

## 2016-06-29 DIAGNOSIS — Z8619 Personal history of other infectious and parasitic diseases: Secondary | ICD-10-CM | POA: Diagnosis not present

## 2016-06-29 DIAGNOSIS — M329 Systemic lupus erythematosus, unspecified: Secondary | ICD-10-CM | POA: Diagnosis not present

## 2016-06-29 DIAGNOSIS — F418 Other specified anxiety disorders: Secondary | ICD-10-CM | POA: Diagnosis not present

## 2016-06-29 DIAGNOSIS — I1310 Hypertensive heart and chronic kidney disease without heart failure, with stage 1 through stage 4 chronic kidney disease, or unspecified chronic kidney disease: Secondary | ICD-10-CM | POA: Diagnosis not present

## 2016-06-29 DIAGNOSIS — N189 Chronic kidney disease, unspecified: Secondary | ICD-10-CM | POA: Diagnosis not present

## 2016-06-29 DIAGNOSIS — M3214 Glomerular disease in systemic lupus erythematosus: Secondary | ICD-10-CM | POA: Diagnosis not present

## 2016-06-29 DIAGNOSIS — I120 Hypertensive chronic kidney disease with stage 5 chronic kidney disease or end stage renal disease: Secondary | ICD-10-CM | POA: Diagnosis not present

## 2016-06-29 DIAGNOSIS — N179 Acute kidney failure, unspecified: Secondary | ICD-10-CM | POA: Diagnosis not present

## 2016-06-29 DIAGNOSIS — N281 Cyst of kidney, acquired: Secondary | ICD-10-CM | POA: Diagnosis not present

## 2016-06-29 DIAGNOSIS — Z9660 Presence of unspecified orthopedic joint implant: Secondary | ICD-10-CM | POA: Diagnosis not present

## 2016-06-29 DIAGNOSIS — N183 Chronic kidney disease, stage 3 (moderate): Secondary | ICD-10-CM | POA: Diagnosis not present

## 2016-07-02 DIAGNOSIS — L930 Discoid lupus erythematosus: Secondary | ICD-10-CM | POA: Diagnosis not present

## 2016-07-02 DIAGNOSIS — T8454XD Infection and inflammatory reaction due to internal left knee prosthesis, subsequent encounter: Secondary | ICD-10-CM | POA: Diagnosis not present

## 2016-07-02 DIAGNOSIS — I10 Essential (primary) hypertension: Secondary | ICD-10-CM | POA: Diagnosis not present

## 2016-07-02 DIAGNOSIS — M8700 Idiopathic aseptic necrosis of unspecified bone: Secondary | ICD-10-CM | POA: Diagnosis not present

## 2016-07-02 DIAGNOSIS — I2510 Atherosclerotic heart disease of native coronary artery without angina pectoris: Secondary | ICD-10-CM | POA: Diagnosis not present

## 2016-07-02 DIAGNOSIS — T8454XA Infection and inflammatory reaction due to internal left knee prosthesis, initial encounter: Secondary | ICD-10-CM | POA: Diagnosis not present

## 2016-07-02 DIAGNOSIS — Z96652 Presence of left artificial knee joint: Secondary | ICD-10-CM | POA: Diagnosis not present

## 2016-07-02 DIAGNOSIS — M25559 Pain in unspecified hip: Secondary | ICD-10-CM | POA: Diagnosis not present

## 2016-07-02 DIAGNOSIS — I129 Hypertensive chronic kidney disease with stage 1 through stage 4 chronic kidney disease, or unspecified chronic kidney disease: Secondary | ICD-10-CM | POA: Diagnosis not present

## 2016-07-02 DIAGNOSIS — N179 Acute kidney failure, unspecified: Secondary | ICD-10-CM | POA: Diagnosis not present

## 2016-07-02 DIAGNOSIS — M3214 Glomerular disease in systemic lupus erythematosus: Secondary | ICD-10-CM | POA: Diagnosis not present

## 2016-07-02 DIAGNOSIS — D631 Anemia in chronic kidney disease: Secondary | ICD-10-CM | POA: Diagnosis not present

## 2016-07-02 DIAGNOSIS — N184 Chronic kidney disease, stage 4 (severe): Secondary | ICD-10-CM | POA: Diagnosis not present

## 2016-07-02 DIAGNOSIS — N183 Chronic kidney disease, stage 3 (moderate): Secondary | ICD-10-CM | POA: Diagnosis not present

## 2016-07-02 DIAGNOSIS — Z66 Do not resuscitate: Secondary | ICD-10-CM | POA: Diagnosis not present

## 2016-07-02 DIAGNOSIS — F329 Major depressive disorder, single episode, unspecified: Secondary | ICD-10-CM | POA: Diagnosis not present

## 2016-07-02 DIAGNOSIS — Z7409 Other reduced mobility: Secondary | ICD-10-CM | POA: Diagnosis not present

## 2016-07-07 DIAGNOSIS — I10 Essential (primary) hypertension: Secondary | ICD-10-CM | POA: Diagnosis not present

## 2016-07-07 DIAGNOSIS — N184 Chronic kidney disease, stage 4 (severe): Secondary | ICD-10-CM | POA: Diagnosis not present

## 2016-07-07 DIAGNOSIS — D631 Anemia in chronic kidney disease: Secondary | ICD-10-CM | POA: Diagnosis not present

## 2016-07-10 DIAGNOSIS — Z96652 Presence of left artificial knee joint: Secondary | ICD-10-CM | POA: Diagnosis not present

## 2016-07-10 DIAGNOSIS — Z471 Aftercare following joint replacement surgery: Secondary | ICD-10-CM | POA: Diagnosis not present

## 2016-07-17 DIAGNOSIS — L930 Discoid lupus erythematosus: Secondary | ICD-10-CM | POA: Diagnosis not present

## 2016-07-17 DIAGNOSIS — T8459XD Infection and inflammatory reaction due to other internal joint prosthesis, subsequent encounter: Secondary | ICD-10-CM | POA: Diagnosis not present

## 2016-07-17 DIAGNOSIS — D638 Anemia in other chronic diseases classified elsewhere: Secondary | ICD-10-CM | POA: Diagnosis not present

## 2016-07-26 DIAGNOSIS — X58XXXD Exposure to other specified factors, subsequent encounter: Secondary | ICD-10-CM | POA: Diagnosis not present

## 2016-07-26 DIAGNOSIS — T8452XD Infection and inflammatory reaction due to internal left hip prosthesis, subsequent encounter: Secondary | ICD-10-CM | POA: Diagnosis not present

## 2016-07-26 DIAGNOSIS — Z96659 Presence of unspecified artificial knee joint: Secondary | ICD-10-CM | POA: Diagnosis not present

## 2016-07-31 DIAGNOSIS — E7800 Pure hypercholesterolemia, unspecified: Secondary | ICD-10-CM | POA: Diagnosis not present

## 2016-07-31 DIAGNOSIS — I639 Cerebral infarction, unspecified: Secondary | ICD-10-CM | POA: Diagnosis not present

## 2016-07-31 DIAGNOSIS — R748 Abnormal levels of other serum enzymes: Secondary | ICD-10-CM | POA: Diagnosis not present

## 2016-07-31 DIAGNOSIS — M2550 Pain in unspecified joint: Secondary | ICD-10-CM | POA: Diagnosis not present

## 2016-07-31 DIAGNOSIS — R7989 Other specified abnormal findings of blood chemistry: Secondary | ICD-10-CM | POA: Diagnosis not present

## 2016-07-31 DIAGNOSIS — D649 Anemia, unspecified: Secondary | ICD-10-CM | POA: Diagnosis not present

## 2016-08-03 DIAGNOSIS — Y831 Surgical operation with implant of artificial internal device as the cause of abnormal reaction of the patient, or of later complication, without mention of misadventure at the time of the procedure: Secondary | ICD-10-CM | POA: Diagnosis not present

## 2016-08-03 DIAGNOSIS — F419 Anxiety disorder, unspecified: Secondary | ICD-10-CM | POA: Diagnosis not present

## 2016-08-03 DIAGNOSIS — E440 Moderate protein-calorie malnutrition: Secondary | ICD-10-CM | POA: Diagnosis not present

## 2016-08-03 DIAGNOSIS — T8454XA Infection and inflammatory reaction due to internal left knee prosthesis, initial encounter: Secondary | ICD-10-CM | POA: Diagnosis not present

## 2016-08-03 DIAGNOSIS — J302 Other seasonal allergic rhinitis: Secondary | ICD-10-CM | POA: Diagnosis not present

## 2016-08-03 DIAGNOSIS — D62 Acute posthemorrhagic anemia: Secondary | ICD-10-CM | POA: Diagnosis not present

## 2016-08-03 DIAGNOSIS — F329 Major depressive disorder, single episode, unspecified: Secondary | ICD-10-CM | POA: Diagnosis not present

## 2016-08-03 DIAGNOSIS — I739 Peripheral vascular disease, unspecified: Secondary | ICD-10-CM | POA: Diagnosis not present

## 2016-08-03 DIAGNOSIS — G8929 Other chronic pain: Secondary | ICD-10-CM | POA: Diagnosis not present

## 2016-08-03 DIAGNOSIS — L89152 Pressure ulcer of sacral region, stage 2: Secondary | ICD-10-CM | POA: Diagnosis not present

## 2016-08-03 DIAGNOSIS — E785 Hyperlipidemia, unspecified: Secondary | ICD-10-CM | POA: Diagnosis not present

## 2016-08-03 DIAGNOSIS — R339 Retention of urine, unspecified: Secondary | ICD-10-CM | POA: Diagnosis not present

## 2016-08-03 DIAGNOSIS — M3214 Glomerular disease in systemic lupus erythematosus: Secondary | ICD-10-CM | POA: Diagnosis not present

## 2016-08-03 DIAGNOSIS — R739 Hyperglycemia, unspecified: Secondary | ICD-10-CM | POA: Diagnosis not present

## 2016-08-03 DIAGNOSIS — N184 Chronic kidney disease, stage 4 (severe): Secondary | ICD-10-CM | POA: Diagnosis not present

## 2016-08-03 DIAGNOSIS — N179 Acute kidney failure, unspecified: Secondary | ICD-10-CM | POA: Diagnosis not present

## 2016-08-03 DIAGNOSIS — K219 Gastro-esophageal reflux disease without esophagitis: Secondary | ICD-10-CM | POA: Diagnosis not present

## 2016-08-03 DIAGNOSIS — I509 Heart failure, unspecified: Secondary | ICD-10-CM | POA: Diagnosis not present

## 2016-08-03 DIAGNOSIS — Z96652 Presence of left artificial knee joint: Secondary | ICD-10-CM | POA: Diagnosis not present

## 2016-08-03 DIAGNOSIS — I2510 Atherosclerotic heart disease of native coronary artery without angina pectoris: Secondary | ICD-10-CM | POA: Diagnosis not present

## 2016-08-03 DIAGNOSIS — E039 Hypothyroidism, unspecified: Secondary | ICD-10-CM | POA: Diagnosis not present

## 2016-08-03 DIAGNOSIS — I9581 Postprocedural hypotension: Secondary | ICD-10-CM | POA: Diagnosis not present

## 2016-08-03 DIAGNOSIS — I130 Hypertensive heart and chronic kidney disease with heart failure and stage 1 through stage 4 chronic kidney disease, or unspecified chronic kidney disease: Secondary | ICD-10-CM | POA: Diagnosis not present

## 2016-08-07 DIAGNOSIS — D649 Anemia, unspecified: Secondary | ICD-10-CM | POA: Diagnosis not present

## 2016-08-07 DIAGNOSIS — I639 Cerebral infarction, unspecified: Secondary | ICD-10-CM | POA: Diagnosis not present

## 2016-08-07 DIAGNOSIS — R7989 Other specified abnormal findings of blood chemistry: Secondary | ICD-10-CM | POA: Diagnosis not present

## 2016-08-07 DIAGNOSIS — M2550 Pain in unspecified joint: Secondary | ICD-10-CM | POA: Diagnosis not present

## 2016-08-07 DIAGNOSIS — E7800 Pure hypercholesterolemia, unspecified: Secondary | ICD-10-CM | POA: Diagnosis not present

## 2016-08-07 DIAGNOSIS — R748 Abnormal levels of other serum enzymes: Secondary | ICD-10-CM | POA: Diagnosis not present

## 2016-08-08 DIAGNOSIS — N184 Chronic kidney disease, stage 4 (severe): Secondary | ICD-10-CM | POA: Diagnosis not present

## 2016-08-08 DIAGNOSIS — I130 Hypertensive heart and chronic kidney disease with heart failure and stage 1 through stage 4 chronic kidney disease, or unspecified chronic kidney disease: Secondary | ICD-10-CM | POA: Diagnosis not present

## 2016-08-08 DIAGNOSIS — F419 Anxiety disorder, unspecified: Secondary | ICD-10-CM | POA: Diagnosis not present

## 2016-08-08 DIAGNOSIS — N179 Acute kidney failure, unspecified: Secondary | ICD-10-CM | POA: Diagnosis not present

## 2016-08-08 DIAGNOSIS — T8459XD Infection and inflammatory reaction due to other internal joint prosthesis, subsequent encounter: Secondary | ICD-10-CM | POA: Diagnosis not present

## 2016-08-08 DIAGNOSIS — I9581 Postprocedural hypotension: Secondary | ICD-10-CM | POA: Diagnosis not present

## 2016-08-08 DIAGNOSIS — T8454XA Infection and inflammatory reaction due to internal left knee prosthesis, initial encounter: Secondary | ICD-10-CM | POA: Diagnosis not present

## 2016-08-08 DIAGNOSIS — T8459XA Infection and inflammatory reaction due to other internal joint prosthesis, initial encounter: Secondary | ICD-10-CM | POA: Diagnosis not present

## 2016-08-08 DIAGNOSIS — F329 Major depressive disorder, single episode, unspecified: Secondary | ICD-10-CM | POA: Diagnosis not present

## 2016-08-08 DIAGNOSIS — G8929 Other chronic pain: Secondary | ICD-10-CM | POA: Diagnosis not present

## 2016-08-08 DIAGNOSIS — G8918 Other acute postprocedural pain: Secondary | ICD-10-CM | POA: Diagnosis not present

## 2016-08-08 DIAGNOSIS — E785 Hyperlipidemia, unspecified: Secondary | ICD-10-CM | POA: Diagnosis not present

## 2016-08-08 DIAGNOSIS — F05 Delirium due to known physiological condition: Secondary | ICD-10-CM | POA: Diagnosis not present

## 2016-08-08 DIAGNOSIS — T84023A Instability of internal left knee prosthesis, initial encounter: Secondary | ICD-10-CM | POA: Diagnosis not present

## 2016-08-08 DIAGNOSIS — Y831 Surgical operation with implant of artificial internal device as the cause of abnormal reaction of the patient, or of later complication, without mention of misadventure at the time of the procedure: Secondary | ICD-10-CM | POA: Diagnosis not present

## 2016-08-08 DIAGNOSIS — R339 Retention of urine, unspecified: Secondary | ICD-10-CM | POA: Diagnosis not present

## 2016-08-08 DIAGNOSIS — E440 Moderate protein-calorie malnutrition: Secondary | ICD-10-CM | POA: Diagnosis not present

## 2016-08-08 DIAGNOSIS — I2510 Atherosclerotic heart disease of native coronary artery without angina pectoris: Secondary | ICD-10-CM | POA: Diagnosis not present

## 2016-08-08 DIAGNOSIS — Z96659 Presence of unspecified artificial knee joint: Secondary | ICD-10-CM | POA: Diagnosis not present

## 2016-08-08 DIAGNOSIS — I739 Peripheral vascular disease, unspecified: Secondary | ICD-10-CM | POA: Diagnosis not present

## 2016-08-08 DIAGNOSIS — I509 Heart failure, unspecified: Secondary | ICD-10-CM | POA: Diagnosis not present

## 2016-08-08 DIAGNOSIS — L89152 Pressure ulcer of sacral region, stage 2: Secondary | ICD-10-CM | POA: Diagnosis not present

## 2016-08-08 DIAGNOSIS — M3214 Glomerular disease in systemic lupus erythematosus: Secondary | ICD-10-CM | POA: Diagnosis not present

## 2016-08-08 DIAGNOSIS — J302 Other seasonal allergic rhinitis: Secondary | ICD-10-CM | POA: Diagnosis not present

## 2016-08-08 DIAGNOSIS — E039 Hypothyroidism, unspecified: Secondary | ICD-10-CM | POA: Diagnosis not present

## 2016-08-08 DIAGNOSIS — Z96652 Presence of left artificial knee joint: Secondary | ICD-10-CM | POA: Diagnosis not present

## 2016-08-08 DIAGNOSIS — K219 Gastro-esophageal reflux disease without esophagitis: Secondary | ICD-10-CM | POA: Diagnosis not present

## 2016-08-08 DIAGNOSIS — Z713 Dietary counseling and surveillance: Secondary | ICD-10-CM | POA: Diagnosis not present

## 2016-08-09 DIAGNOSIS — I2510 Atherosclerotic heart disease of native coronary artery without angina pectoris: Secondary | ICD-10-CM | POA: Diagnosis not present

## 2016-08-09 DIAGNOSIS — Z96651 Presence of right artificial knee joint: Secondary | ICD-10-CM | POA: Diagnosis not present

## 2016-08-09 DIAGNOSIS — T8039XA Other ABO incompatibility reaction due to transfusion of blood or blood products, initial encounter: Secondary | ICD-10-CM | POA: Diagnosis not present

## 2016-08-09 DIAGNOSIS — Z96652 Presence of left artificial knee joint: Secondary | ICD-10-CM | POA: Diagnosis not present

## 2016-08-09 DIAGNOSIS — G8929 Other chronic pain: Secondary | ICD-10-CM | POA: Diagnosis not present

## 2016-08-09 DIAGNOSIS — I739 Peripheral vascular disease, unspecified: Secondary | ICD-10-CM | POA: Diagnosis not present

## 2016-08-09 DIAGNOSIS — T8459XD Infection and inflammatory reaction due to other internal joint prosthesis, subsequent encounter: Secondary | ICD-10-CM | POA: Diagnosis not present

## 2016-08-09 DIAGNOSIS — Z951 Presence of aortocoronary bypass graft: Secondary | ICD-10-CM | POA: Diagnosis not present

## 2016-08-09 DIAGNOSIS — I9581 Postprocedural hypotension: Secondary | ICD-10-CM | POA: Diagnosis not present

## 2016-08-09 DIAGNOSIS — Z7982 Long term (current) use of aspirin: Secondary | ICD-10-CM | POA: Diagnosis not present

## 2016-08-09 DIAGNOSIS — Z96641 Presence of right artificial hip joint: Secondary | ICD-10-CM | POA: Diagnosis not present

## 2016-08-09 DIAGNOSIS — Z96659 Presence of unspecified artificial knee joint: Secondary | ICD-10-CM | POA: Diagnosis not present

## 2016-08-09 DIAGNOSIS — Z96642 Presence of left artificial hip joint: Secondary | ICD-10-CM | POA: Diagnosis not present

## 2016-08-09 DIAGNOSIS — G8928 Other chronic postprocedural pain: Secondary | ICD-10-CM | POA: Diagnosis not present

## 2016-08-09 DIAGNOSIS — E785 Hyperlipidemia, unspecified: Secondary | ICD-10-CM | POA: Diagnosis not present

## 2016-08-09 DIAGNOSIS — Z8673 Personal history of transient ischemic attack (TIA), and cerebral infarction without residual deficits: Secondary | ICD-10-CM | POA: Diagnosis not present

## 2016-08-09 DIAGNOSIS — T8459XA Infection and inflammatory reaction due to other internal joint prosthesis, initial encounter: Secondary | ICD-10-CM | POA: Diagnosis not present

## 2016-08-10 DIAGNOSIS — I129 Hypertensive chronic kidney disease with stage 1 through stage 4 chronic kidney disease, or unspecified chronic kidney disease: Secondary | ICD-10-CM | POA: Diagnosis not present

## 2016-08-10 DIAGNOSIS — M3214 Glomerular disease in systemic lupus erythematosus: Secondary | ICD-10-CM | POA: Diagnosis not present

## 2016-08-10 DIAGNOSIS — B9689 Other specified bacterial agents as the cause of diseases classified elsewhere: Secondary | ICD-10-CM | POA: Diagnosis not present

## 2016-08-10 DIAGNOSIS — Z96659 Presence of unspecified artificial knee joint: Secondary | ICD-10-CM | POA: Diagnosis not present

## 2016-08-10 DIAGNOSIS — T8459XA Infection and inflammatory reaction due to other internal joint prosthesis, initial encounter: Secondary | ICD-10-CM | POA: Diagnosis not present

## 2016-08-10 DIAGNOSIS — G8929 Other chronic pain: Secondary | ICD-10-CM | POA: Diagnosis not present

## 2016-08-10 DIAGNOSIS — I9581 Postprocedural hypotension: Secondary | ICD-10-CM | POA: Diagnosis not present

## 2016-08-10 DIAGNOSIS — T8454XA Infection and inflammatory reaction due to internal left knee prosthesis, initial encounter: Secondary | ICD-10-CM | POA: Diagnosis not present

## 2016-08-10 DIAGNOSIS — N183 Chronic kidney disease, stage 3 (moderate): Secondary | ICD-10-CM | POA: Diagnosis not present

## 2016-08-10 DIAGNOSIS — G8928 Other chronic postprocedural pain: Secondary | ICD-10-CM | POA: Diagnosis not present

## 2016-08-10 DIAGNOSIS — I2510 Atherosclerotic heart disease of native coronary artery without angina pectoris: Secondary | ICD-10-CM | POA: Diagnosis not present

## 2016-08-10 DIAGNOSIS — Z951 Presence of aortocoronary bypass graft: Secondary | ICD-10-CM | POA: Diagnosis not present

## 2016-08-10 DIAGNOSIS — B957 Other staphylococcus as the cause of diseases classified elsewhere: Secondary | ICD-10-CM | POA: Diagnosis not present

## 2016-08-10 DIAGNOSIS — N179 Acute kidney failure, unspecified: Secondary | ICD-10-CM | POA: Diagnosis not present

## 2016-08-11 DIAGNOSIS — G8929 Other chronic pain: Secondary | ICD-10-CM | POA: Diagnosis not present

## 2016-08-11 DIAGNOSIS — I129 Hypertensive chronic kidney disease with stage 1 through stage 4 chronic kidney disease, or unspecified chronic kidney disease: Secondary | ICD-10-CM | POA: Diagnosis not present

## 2016-08-11 DIAGNOSIS — N183 Chronic kidney disease, stage 3 (moderate): Secondary | ICD-10-CM | POA: Diagnosis not present

## 2016-08-11 DIAGNOSIS — B9689 Other specified bacterial agents as the cause of diseases classified elsewhere: Secondary | ICD-10-CM | POA: Diagnosis not present

## 2016-08-11 DIAGNOSIS — B957 Other staphylococcus as the cause of diseases classified elsewhere: Secondary | ICD-10-CM | POA: Diagnosis not present

## 2016-08-11 DIAGNOSIS — I2510 Atherosclerotic heart disease of native coronary artery without angina pectoris: Secondary | ICD-10-CM | POA: Diagnosis not present

## 2016-08-11 DIAGNOSIS — T8454XA Infection and inflammatory reaction due to internal left knee prosthesis, initial encounter: Secondary | ICD-10-CM | POA: Diagnosis not present

## 2016-08-11 DIAGNOSIS — M3214 Glomerular disease in systemic lupus erythematosus: Secondary | ICD-10-CM | POA: Diagnosis not present

## 2016-08-11 DIAGNOSIS — E785 Hyperlipidemia, unspecified: Secondary | ICD-10-CM | POA: Diagnosis not present

## 2016-08-11 DIAGNOSIS — I9581 Postprocedural hypotension: Secondary | ICD-10-CM | POA: Diagnosis not present

## 2016-08-11 DIAGNOSIS — N179 Acute kidney failure, unspecified: Secondary | ICD-10-CM | POA: Diagnosis not present

## 2016-08-11 DIAGNOSIS — Z951 Presence of aortocoronary bypass graft: Secondary | ICD-10-CM | POA: Diagnosis not present

## 2016-08-12 DIAGNOSIS — Z951 Presence of aortocoronary bypass graft: Secondary | ICD-10-CM | POA: Diagnosis not present

## 2016-08-12 DIAGNOSIS — I739 Peripheral vascular disease, unspecified: Secondary | ICD-10-CM | POA: Diagnosis not present

## 2016-08-12 DIAGNOSIS — N179 Acute kidney failure, unspecified: Secondary | ICD-10-CM | POA: Diagnosis not present

## 2016-08-12 DIAGNOSIS — I2510 Atherosclerotic heart disease of native coronary artery without angina pectoris: Secondary | ICD-10-CM | POA: Diagnosis not present

## 2016-08-12 DIAGNOSIS — N183 Chronic kidney disease, stage 3 (moderate): Secondary | ICD-10-CM | POA: Diagnosis not present

## 2016-08-12 DIAGNOSIS — B9689 Other specified bacterial agents as the cause of diseases classified elsewhere: Secondary | ICD-10-CM | POA: Diagnosis not present

## 2016-08-12 DIAGNOSIS — I129 Hypertensive chronic kidney disease with stage 1 through stage 4 chronic kidney disease, or unspecified chronic kidney disease: Secondary | ICD-10-CM | POA: Diagnosis not present

## 2016-08-12 DIAGNOSIS — G8929 Other chronic pain: Secondary | ICD-10-CM | POA: Diagnosis not present

## 2016-08-12 DIAGNOSIS — E785 Hyperlipidemia, unspecified: Secondary | ICD-10-CM | POA: Diagnosis not present

## 2016-08-12 DIAGNOSIS — B957 Other staphylococcus as the cause of diseases classified elsewhere: Secondary | ICD-10-CM | POA: Diagnosis not present

## 2016-08-12 DIAGNOSIS — T8454XA Infection and inflammatory reaction due to internal left knee prosthesis, initial encounter: Secondary | ICD-10-CM | POA: Diagnosis not present

## 2016-08-12 DIAGNOSIS — M3214 Glomerular disease in systemic lupus erythematosus: Secondary | ICD-10-CM | POA: Diagnosis not present

## 2016-08-13 DIAGNOSIS — N179 Acute kidney failure, unspecified: Secondary | ICD-10-CM | POA: Diagnosis not present

## 2016-08-13 DIAGNOSIS — B957 Other staphylococcus as the cause of diseases classified elsewhere: Secondary | ICD-10-CM | POA: Diagnosis not present

## 2016-08-13 DIAGNOSIS — N183 Chronic kidney disease, stage 3 (moderate): Secondary | ICD-10-CM | POA: Diagnosis not present

## 2016-08-13 DIAGNOSIS — B9689 Other specified bacterial agents as the cause of diseases classified elsewhere: Secondary | ICD-10-CM | POA: Diagnosis not present

## 2016-08-13 DIAGNOSIS — R05 Cough: Secondary | ICD-10-CM | POA: Diagnosis not present

## 2016-08-13 DIAGNOSIS — Z951 Presence of aortocoronary bypass graft: Secondary | ICD-10-CM | POA: Diagnosis not present

## 2016-08-13 DIAGNOSIS — M3214 Glomerular disease in systemic lupus erythematosus: Secondary | ICD-10-CM | POA: Diagnosis not present

## 2016-08-13 DIAGNOSIS — G8929 Other chronic pain: Secondary | ICD-10-CM | POA: Diagnosis not present

## 2016-08-13 DIAGNOSIS — I2510 Atherosclerotic heart disease of native coronary artery without angina pectoris: Secondary | ICD-10-CM | POA: Diagnosis not present

## 2016-08-13 DIAGNOSIS — T8454XA Infection and inflammatory reaction due to internal left knee prosthesis, initial encounter: Secondary | ICD-10-CM | POA: Diagnosis not present

## 2016-08-13 DIAGNOSIS — I129 Hypertensive chronic kidney disease with stage 1 through stage 4 chronic kidney disease, or unspecified chronic kidney disease: Secondary | ICD-10-CM | POA: Diagnosis not present

## 2016-08-13 DIAGNOSIS — J029 Acute pharyngitis, unspecified: Secondary | ICD-10-CM | POA: Diagnosis not present

## 2016-08-14 DIAGNOSIS — B9689 Other specified bacterial agents as the cause of diseases classified elsewhere: Secondary | ICD-10-CM | POA: Diagnosis not present

## 2016-08-14 DIAGNOSIS — N179 Acute kidney failure, unspecified: Secondary | ICD-10-CM | POA: Diagnosis not present

## 2016-08-14 DIAGNOSIS — N183 Chronic kidney disease, stage 3 (moderate): Secondary | ICD-10-CM | POA: Diagnosis not present

## 2016-08-14 DIAGNOSIS — R05 Cough: Secondary | ICD-10-CM | POA: Diagnosis not present

## 2016-08-14 DIAGNOSIS — I129 Hypertensive chronic kidney disease with stage 1 through stage 4 chronic kidney disease, or unspecified chronic kidney disease: Secondary | ICD-10-CM | POA: Diagnosis not present

## 2016-08-14 DIAGNOSIS — I2510 Atherosclerotic heart disease of native coronary artery without angina pectoris: Secondary | ICD-10-CM | POA: Diagnosis not present

## 2016-08-14 DIAGNOSIS — T8454XA Infection and inflammatory reaction due to internal left knee prosthesis, initial encounter: Secondary | ICD-10-CM | POA: Diagnosis not present

## 2016-08-14 DIAGNOSIS — M3214 Glomerular disease in systemic lupus erythematosus: Secondary | ICD-10-CM | POA: Diagnosis not present

## 2016-08-14 DIAGNOSIS — G8929 Other chronic pain: Secondary | ICD-10-CM | POA: Diagnosis not present

## 2016-08-14 DIAGNOSIS — B957 Other staphylococcus as the cause of diseases classified elsewhere: Secondary | ICD-10-CM | POA: Diagnosis not present

## 2016-08-14 DIAGNOSIS — J029 Acute pharyngitis, unspecified: Secondary | ICD-10-CM | POA: Diagnosis not present

## 2016-08-14 DIAGNOSIS — Z79891 Long term (current) use of opiate analgesic: Secondary | ICD-10-CM | POA: Diagnosis not present

## 2016-08-15 DIAGNOSIS — J029 Acute pharyngitis, unspecified: Secondary | ICD-10-CM | POA: Diagnosis not present

## 2016-08-15 DIAGNOSIS — G8929 Other chronic pain: Secondary | ICD-10-CM | POA: Diagnosis not present

## 2016-08-15 DIAGNOSIS — N183 Chronic kidney disease, stage 3 (moderate): Secondary | ICD-10-CM | POA: Diagnosis not present

## 2016-08-15 DIAGNOSIS — Z79891 Long term (current) use of opiate analgesic: Secondary | ICD-10-CM | POA: Diagnosis not present

## 2016-08-15 DIAGNOSIS — B9689 Other specified bacterial agents as the cause of diseases classified elsewhere: Secondary | ICD-10-CM | POA: Diagnosis not present

## 2016-08-15 DIAGNOSIS — I2510 Atherosclerotic heart disease of native coronary artery without angina pectoris: Secondary | ICD-10-CM | POA: Diagnosis not present

## 2016-08-15 DIAGNOSIS — R05 Cough: Secondary | ICD-10-CM | POA: Diagnosis not present

## 2016-08-15 DIAGNOSIS — N179 Acute kidney failure, unspecified: Secondary | ICD-10-CM | POA: Diagnosis not present

## 2016-08-15 DIAGNOSIS — I129 Hypertensive chronic kidney disease with stage 1 through stage 4 chronic kidney disease, or unspecified chronic kidney disease: Secondary | ICD-10-CM | POA: Diagnosis not present

## 2016-08-15 DIAGNOSIS — M3214 Glomerular disease in systemic lupus erythematosus: Secondary | ICD-10-CM | POA: Diagnosis not present

## 2016-08-15 DIAGNOSIS — T8454XA Infection and inflammatory reaction due to internal left knee prosthesis, initial encounter: Secondary | ICD-10-CM | POA: Diagnosis not present

## 2016-08-15 DIAGNOSIS — B957 Other staphylococcus as the cause of diseases classified elsewhere: Secondary | ICD-10-CM | POA: Diagnosis not present

## 2016-08-16 DIAGNOSIS — Y999 Unspecified external cause status: Secondary | ICD-10-CM | POA: Diagnosis not present

## 2016-08-16 DIAGNOSIS — I2510 Atherosclerotic heart disease of native coronary artery without angina pectoris: Secondary | ICD-10-CM | POA: Diagnosis not present

## 2016-08-16 DIAGNOSIS — M3214 Glomerular disease in systemic lupus erythematosus: Secondary | ICD-10-CM | POA: Diagnosis not present

## 2016-08-16 DIAGNOSIS — B957 Other staphylococcus as the cause of diseases classified elsewhere: Secondary | ICD-10-CM | POA: Diagnosis not present

## 2016-08-16 DIAGNOSIS — M25551 Pain in right hip: Secondary | ICD-10-CM | POA: Diagnosis not present

## 2016-08-16 DIAGNOSIS — M533 Sacrococcygeal disorders, not elsewhere classified: Secondary | ICD-10-CM | POA: Diagnosis not present

## 2016-08-16 DIAGNOSIS — Z79891 Long term (current) use of opiate analgesic: Secondary | ICD-10-CM | POA: Diagnosis not present

## 2016-08-16 DIAGNOSIS — I509 Heart failure, unspecified: Secondary | ICD-10-CM | POA: Diagnosis not present

## 2016-08-16 DIAGNOSIS — S79912A Unspecified injury of left hip, initial encounter: Secondary | ICD-10-CM | POA: Diagnosis not present

## 2016-08-16 DIAGNOSIS — B9689 Other specified bacterial agents as the cause of diseases classified elsewhere: Secondary | ICD-10-CM | POA: Diagnosis not present

## 2016-08-16 DIAGNOSIS — W19XXXA Unspecified fall, initial encounter: Secondary | ICD-10-CM | POA: Diagnosis not present

## 2016-08-16 DIAGNOSIS — Z471 Aftercare following joint replacement surgery: Secondary | ICD-10-CM | POA: Diagnosis not present

## 2016-08-16 DIAGNOSIS — G8929 Other chronic pain: Secondary | ICD-10-CM | POA: Diagnosis not present

## 2016-08-16 DIAGNOSIS — J029 Acute pharyngitis, unspecified: Secondary | ICD-10-CM | POA: Diagnosis not present

## 2016-08-16 DIAGNOSIS — M25552 Pain in left hip: Secondary | ICD-10-CM | POA: Diagnosis not present

## 2016-08-16 DIAGNOSIS — F329 Major depressive disorder, single episode, unspecified: Secondary | ICD-10-CM | POA: Diagnosis not present

## 2016-08-16 DIAGNOSIS — M799 Soft tissue disorder, unspecified: Secondary | ICD-10-CM | POA: Diagnosis not present

## 2016-08-16 DIAGNOSIS — W06XXXA Fall from bed, initial encounter: Secondary | ICD-10-CM | POA: Diagnosis not present

## 2016-08-16 DIAGNOSIS — Z96652 Presence of left artificial knee joint: Secondary | ICD-10-CM | POA: Diagnosis not present

## 2016-08-16 DIAGNOSIS — M898X5 Other specified disorders of bone, thigh: Secondary | ICD-10-CM | POA: Diagnosis not present

## 2016-08-16 DIAGNOSIS — I129 Hypertensive chronic kidney disease with stage 1 through stage 4 chronic kidney disease, or unspecified chronic kidney disease: Secondary | ICD-10-CM | POA: Diagnosis not present

## 2016-08-16 DIAGNOSIS — Z7409 Other reduced mobility: Secondary | ICD-10-CM | POA: Diagnosis not present

## 2016-08-16 DIAGNOSIS — T8454XA Infection and inflammatory reaction due to internal left knee prosthesis, initial encounter: Secondary | ICD-10-CM | POA: Diagnosis not present

## 2016-08-16 DIAGNOSIS — M8580 Other specified disorders of bone density and structure, unspecified site: Secondary | ICD-10-CM | POA: Diagnosis not present

## 2016-08-16 DIAGNOSIS — L930 Discoid lupus erythematosus: Secondary | ICD-10-CM | POA: Diagnosis not present

## 2016-08-16 DIAGNOSIS — Z4889 Encounter for other specified surgical aftercare: Secondary | ICD-10-CM | POA: Diagnosis not present

## 2016-08-16 DIAGNOSIS — N179 Acute kidney failure, unspecified: Secondary | ICD-10-CM | POA: Diagnosis not present

## 2016-08-16 DIAGNOSIS — T84021A Dislocation of internal left hip prosthesis, initial encounter: Secondary | ICD-10-CM | POA: Diagnosis not present

## 2016-08-16 DIAGNOSIS — S73005A Unspecified dislocation of left hip, initial encounter: Secondary | ICD-10-CM | POA: Diagnosis not present

## 2016-08-16 DIAGNOSIS — R05 Cough: Secondary | ICD-10-CM | POA: Diagnosis not present

## 2016-08-16 DIAGNOSIS — I10 Essential (primary) hypertension: Secondary | ICD-10-CM | POA: Diagnosis not present

## 2016-08-16 DIAGNOSIS — N183 Chronic kidney disease, stage 3 (moderate): Secondary | ICD-10-CM | POA: Diagnosis not present

## 2016-08-16 DIAGNOSIS — T148XXA Other injury of unspecified body region, initial encounter: Secondary | ICD-10-CM | POA: Diagnosis not present

## 2016-08-16 DIAGNOSIS — T8454XD Infection and inflammatory reaction due to internal left knee prosthesis, subsequent encounter: Secondary | ICD-10-CM | POA: Diagnosis not present

## 2016-08-21 DIAGNOSIS — Y999 Unspecified external cause status: Secondary | ICD-10-CM | POA: Diagnosis not present

## 2016-08-21 DIAGNOSIS — M533 Sacrococcygeal disorders, not elsewhere classified: Secondary | ICD-10-CM | POA: Diagnosis not present

## 2016-08-21 DIAGNOSIS — S73005A Unspecified dislocation of left hip, initial encounter: Secondary | ICD-10-CM | POA: Diagnosis not present

## 2016-08-21 DIAGNOSIS — W19XXXA Unspecified fall, initial encounter: Secondary | ICD-10-CM | POA: Diagnosis not present

## 2016-08-21 DIAGNOSIS — Z96652 Presence of left artificial knee joint: Secondary | ICD-10-CM | POA: Diagnosis not present

## 2016-08-21 DIAGNOSIS — W06XXXA Fall from bed, initial encounter: Secondary | ICD-10-CM | POA: Diagnosis not present

## 2016-08-21 DIAGNOSIS — M898X5 Other specified disorders of bone, thigh: Secondary | ICD-10-CM | POA: Diagnosis not present

## 2016-08-21 DIAGNOSIS — M8580 Other specified disorders of bone density and structure, unspecified site: Secondary | ICD-10-CM | POA: Diagnosis not present

## 2016-08-21 DIAGNOSIS — M799 Soft tissue disorder, unspecified: Secondary | ICD-10-CM | POA: Diagnosis not present

## 2016-08-21 DIAGNOSIS — S79912A Unspecified injury of left hip, initial encounter: Secondary | ICD-10-CM | POA: Diagnosis not present

## 2016-08-21 DIAGNOSIS — T84021A Dislocation of internal left hip prosthesis, initial encounter: Secondary | ICD-10-CM | POA: Diagnosis not present

## 2016-08-21 DIAGNOSIS — M25552 Pain in left hip: Secondary | ICD-10-CM | POA: Diagnosis not present

## 2016-08-21 DIAGNOSIS — M25551 Pain in right hip: Secondary | ICD-10-CM | POA: Diagnosis not present

## 2016-08-21 DIAGNOSIS — Z471 Aftercare following joint replacement surgery: Secondary | ICD-10-CM | POA: Diagnosis not present

## 2016-08-22 DIAGNOSIS — D631 Anemia in chronic kidney disease: Secondary | ICD-10-CM | POA: Diagnosis not present

## 2016-08-22 DIAGNOSIS — Z7982 Long term (current) use of aspirin: Secondary | ICD-10-CM | POA: Diagnosis not present

## 2016-08-22 DIAGNOSIS — N184 Chronic kidney disease, stage 4 (severe): Secondary | ICD-10-CM | POA: Diagnosis not present

## 2016-08-22 DIAGNOSIS — N183 Chronic kidney disease, stage 3 (moderate): Secondary | ICD-10-CM | POA: Diagnosis not present

## 2016-08-22 DIAGNOSIS — I10 Essential (primary) hypertension: Secondary | ICD-10-CM | POA: Diagnosis not present

## 2016-08-22 DIAGNOSIS — I2510 Atherosclerotic heart disease of native coronary artery without angina pectoris: Secondary | ICD-10-CM | POA: Diagnosis not present

## 2016-08-22 DIAGNOSIS — Z882 Allergy status to sulfonamides status: Secondary | ICD-10-CM | POA: Diagnosis not present

## 2016-08-22 DIAGNOSIS — Z881 Allergy status to other antibiotic agents status: Secondary | ICD-10-CM | POA: Diagnosis not present

## 2016-08-22 DIAGNOSIS — D649 Anemia, unspecified: Secondary | ICD-10-CM | POA: Diagnosis not present

## 2016-08-22 DIAGNOSIS — Z87891 Personal history of nicotine dependence: Secondary | ICD-10-CM | POA: Diagnosis not present

## 2016-08-22 DIAGNOSIS — Z8619 Personal history of other infectious and parasitic diseases: Secondary | ICD-10-CM | POA: Diagnosis not present

## 2016-08-22 DIAGNOSIS — N19 Unspecified kidney failure: Secondary | ICD-10-CM | POA: Diagnosis not present

## 2016-08-22 DIAGNOSIS — Z96643 Presence of artificial hip joint, bilateral: Secondary | ICD-10-CM | POA: Diagnosis not present

## 2016-08-22 DIAGNOSIS — G8918 Other acute postprocedural pain: Secondary | ICD-10-CM | POA: Diagnosis not present

## 2016-08-22 DIAGNOSIS — R05 Cough: Secondary | ICD-10-CM | POA: Diagnosis not present

## 2016-08-22 DIAGNOSIS — M25559 Pain in unspecified hip: Secondary | ICD-10-CM | POA: Diagnosis not present

## 2016-08-22 DIAGNOSIS — I110 Hypertensive heart disease with heart failure: Secondary | ICD-10-CM | POA: Diagnosis not present

## 2016-08-22 DIAGNOSIS — T8454XD Infection and inflammatory reaction due to internal left knee prosthesis, subsequent encounter: Secondary | ICD-10-CM | POA: Diagnosis not present

## 2016-08-22 DIAGNOSIS — Z4889 Encounter for other specified surgical aftercare: Secondary | ICD-10-CM | POA: Diagnosis not present

## 2016-08-22 DIAGNOSIS — I130 Hypertensive heart and chronic kidney disease with heart failure and stage 1 through stage 4 chronic kidney disease, or unspecified chronic kidney disease: Secondary | ICD-10-CM | POA: Diagnosis not present

## 2016-08-22 DIAGNOSIS — Z471 Aftercare following joint replacement surgery: Secondary | ICD-10-CM | POA: Diagnosis not present

## 2016-08-22 DIAGNOSIS — E785 Hyperlipidemia, unspecified: Secondary | ICD-10-CM | POA: Diagnosis not present

## 2016-08-22 DIAGNOSIS — Z96653 Presence of artificial knee joint, bilateral: Secondary | ICD-10-CM | POA: Diagnosis not present

## 2016-08-22 DIAGNOSIS — N390 Urinary tract infection, site not specified: Secondary | ICD-10-CM | POA: Diagnosis not present

## 2016-08-22 DIAGNOSIS — N189 Chronic kidney disease, unspecified: Secondary | ICD-10-CM | POA: Diagnosis not present

## 2016-08-22 DIAGNOSIS — F329 Major depressive disorder, single episode, unspecified: Secondary | ICD-10-CM | POA: Diagnosis not present

## 2016-08-22 DIAGNOSIS — L930 Discoid lupus erythematosus: Secondary | ICD-10-CM | POA: Diagnosis not present

## 2016-08-22 DIAGNOSIS — Z951 Presence of aortocoronary bypass graft: Secondary | ICD-10-CM | POA: Diagnosis not present

## 2016-08-22 DIAGNOSIS — E559 Vitamin D deficiency, unspecified: Secondary | ICD-10-CM | POA: Diagnosis not present

## 2016-08-22 DIAGNOSIS — Z8673 Personal history of transient ischemic attack (TIA), and cerebral infarction without residual deficits: Secondary | ICD-10-CM | POA: Diagnosis not present

## 2016-08-22 DIAGNOSIS — Y792 Prosthetic and other implants, materials and accessory orthopedic devices associated with adverse incidents: Secondary | ICD-10-CM | POA: Diagnosis not present

## 2016-08-22 DIAGNOSIS — Z888 Allergy status to other drugs, medicaments and biological substances status: Secondary | ICD-10-CM | POA: Diagnosis not present

## 2016-08-22 DIAGNOSIS — Z79899 Other long term (current) drug therapy: Secondary | ICD-10-CM | POA: Diagnosis not present

## 2016-08-22 DIAGNOSIS — I509 Heart failure, unspecified: Secondary | ICD-10-CM | POA: Diagnosis not present

## 2016-08-22 DIAGNOSIS — I129 Hypertensive chronic kidney disease with stage 1 through stage 4 chronic kidney disease, or unspecified chronic kidney disease: Secondary | ICD-10-CM | POA: Diagnosis not present

## 2016-08-22 DIAGNOSIS — I252 Old myocardial infarction: Secondary | ICD-10-CM | POA: Diagnosis not present

## 2016-08-22 DIAGNOSIS — Z96652 Presence of left artificial knee joint: Secondary | ICD-10-CM | POA: Diagnosis not present

## 2016-08-24 DIAGNOSIS — D649 Anemia, unspecified: Secondary | ICD-10-CM | POA: Diagnosis not present

## 2016-08-24 DIAGNOSIS — Z4889 Encounter for other specified surgical aftercare: Secondary | ICD-10-CM | POA: Diagnosis not present

## 2016-08-24 DIAGNOSIS — G8918 Other acute postprocedural pain: Secondary | ICD-10-CM | POA: Diagnosis not present

## 2016-08-24 DIAGNOSIS — I2510 Atherosclerotic heart disease of native coronary artery without angina pectoris: Secondary | ICD-10-CM | POA: Diagnosis not present

## 2016-08-28 DIAGNOSIS — Z96652 Presence of left artificial knee joint: Secondary | ICD-10-CM | POA: Diagnosis not present

## 2016-08-28 DIAGNOSIS — I2510 Atherosclerotic heart disease of native coronary artery without angina pectoris: Secondary | ICD-10-CM | POA: Diagnosis not present

## 2016-08-28 DIAGNOSIS — Z951 Presence of aortocoronary bypass graft: Secondary | ICD-10-CM | POA: Diagnosis not present

## 2016-08-28 DIAGNOSIS — Z8673 Personal history of transient ischemic attack (TIA), and cerebral infarction without residual deficits: Secondary | ICD-10-CM | POA: Diagnosis not present

## 2016-08-28 DIAGNOSIS — I509 Heart failure, unspecified: Secondary | ICD-10-CM | POA: Diagnosis not present

## 2016-08-28 DIAGNOSIS — I130 Hypertensive heart and chronic kidney disease with heart failure and stage 1 through stage 4 chronic kidney disease, or unspecified chronic kidney disease: Secondary | ICD-10-CM | POA: Diagnosis not present

## 2016-08-28 DIAGNOSIS — Z87891 Personal history of nicotine dependence: Secondary | ICD-10-CM | POA: Diagnosis not present

## 2016-08-28 DIAGNOSIS — Z882 Allergy status to sulfonamides status: Secondary | ICD-10-CM | POA: Diagnosis not present

## 2016-08-28 DIAGNOSIS — I252 Old myocardial infarction: Secondary | ICD-10-CM | POA: Diagnosis not present

## 2016-08-28 DIAGNOSIS — Z471 Aftercare following joint replacement surgery: Secondary | ICD-10-CM | POA: Diagnosis not present

## 2016-08-28 DIAGNOSIS — N189 Chronic kidney disease, unspecified: Secondary | ICD-10-CM | POA: Diagnosis not present

## 2016-08-28 DIAGNOSIS — Z881 Allergy status to other antibiotic agents status: Secondary | ICD-10-CM | POA: Diagnosis not present

## 2016-08-28 DIAGNOSIS — Z888 Allergy status to other drugs, medicaments and biological substances status: Secondary | ICD-10-CM | POA: Diagnosis not present

## 2016-08-28 DIAGNOSIS — E785 Hyperlipidemia, unspecified: Secondary | ICD-10-CM | POA: Diagnosis not present

## 2016-08-30 DIAGNOSIS — I10 Essential (primary) hypertension: Secondary | ICD-10-CM | POA: Diagnosis not present

## 2016-08-30 DIAGNOSIS — N184 Chronic kidney disease, stage 4 (severe): Secondary | ICD-10-CM | POA: Diagnosis not present

## 2016-09-04 DIAGNOSIS — Y792 Prosthetic and other implants, materials and accessory orthopedic devices associated with adverse incidents: Secondary | ICD-10-CM | POA: Diagnosis not present

## 2016-09-04 DIAGNOSIS — Z882 Allergy status to sulfonamides status: Secondary | ICD-10-CM | POA: Diagnosis not present

## 2016-09-04 DIAGNOSIS — Z8619 Personal history of other infectious and parasitic diseases: Secondary | ICD-10-CM | POA: Diagnosis not present

## 2016-09-04 DIAGNOSIS — Z96643 Presence of artificial hip joint, bilateral: Secondary | ICD-10-CM | POA: Diagnosis not present

## 2016-09-04 DIAGNOSIS — Z951 Presence of aortocoronary bypass graft: Secondary | ICD-10-CM | POA: Diagnosis not present

## 2016-09-04 DIAGNOSIS — Z881 Allergy status to other antibiotic agents status: Secondary | ICD-10-CM | POA: Diagnosis not present

## 2016-09-04 DIAGNOSIS — Z79899 Other long term (current) drug therapy: Secondary | ICD-10-CM | POA: Diagnosis not present

## 2016-09-04 DIAGNOSIS — Z7982 Long term (current) use of aspirin: Secondary | ICD-10-CM | POA: Diagnosis not present

## 2016-09-04 DIAGNOSIS — Z96653 Presence of artificial knee joint, bilateral: Secondary | ICD-10-CM | POA: Diagnosis not present

## 2016-09-04 DIAGNOSIS — I2510 Atherosclerotic heart disease of native coronary artery without angina pectoris: Secondary | ICD-10-CM | POA: Diagnosis not present

## 2016-09-04 DIAGNOSIS — E785 Hyperlipidemia, unspecified: Secondary | ICD-10-CM | POA: Diagnosis not present

## 2016-09-04 DIAGNOSIS — T8454XD Infection and inflammatory reaction due to internal left knee prosthesis, subsequent encounter: Secondary | ICD-10-CM | POA: Diagnosis not present

## 2016-09-04 DIAGNOSIS — N189 Chronic kidney disease, unspecified: Secondary | ICD-10-CM | POA: Diagnosis not present

## 2016-09-04 DIAGNOSIS — I129 Hypertensive chronic kidney disease with stage 1 through stage 4 chronic kidney disease, or unspecified chronic kidney disease: Secondary | ICD-10-CM | POA: Diagnosis not present

## 2016-09-06 DIAGNOSIS — N184 Chronic kidney disease, stage 4 (severe): Secondary | ICD-10-CM | POA: Diagnosis not present

## 2016-09-06 DIAGNOSIS — D631 Anemia in chronic kidney disease: Secondary | ICD-10-CM | POA: Diagnosis not present

## 2016-09-06 DIAGNOSIS — I10 Essential (primary) hypertension: Secondary | ICD-10-CM | POA: Diagnosis not present

## 2016-09-06 DIAGNOSIS — N390 Urinary tract infection, site not specified: Secondary | ICD-10-CM | POA: Diagnosis not present

## 2016-09-18 DIAGNOSIS — Z96652 Presence of left artificial knee joint: Secondary | ICD-10-CM | POA: Diagnosis not present

## 2016-09-18 DIAGNOSIS — T8454XD Infection and inflammatory reaction due to internal left knee prosthesis, subsequent encounter: Secondary | ICD-10-CM | POA: Diagnosis not present

## 2016-09-20 DIAGNOSIS — L930 Discoid lupus erythematosus: Secondary | ICD-10-CM | POA: Diagnosis not present

## 2016-09-20 DIAGNOSIS — M6281 Muscle weakness (generalized): Secondary | ICD-10-CM | POA: Diagnosis not present

## 2016-09-20 DIAGNOSIS — Z96642 Presence of left artificial hip joint: Secondary | ICD-10-CM | POA: Diagnosis not present

## 2016-09-20 DIAGNOSIS — I2510 Atherosclerotic heart disease of native coronary artery without angina pectoris: Secondary | ICD-10-CM | POA: Diagnosis not present

## 2016-09-20 DIAGNOSIS — M8700 Idiopathic aseptic necrosis of unspecified bone: Secondary | ICD-10-CM | POA: Diagnosis not present

## 2016-09-20 DIAGNOSIS — I739 Peripheral vascular disease, unspecified: Secondary | ICD-10-CM | POA: Diagnosis not present

## 2016-09-20 DIAGNOSIS — T8454XD Infection and inflammatory reaction due to internal left knee prosthesis, subsequent encounter: Secondary | ICD-10-CM | POA: Diagnosis not present

## 2016-09-20 DIAGNOSIS — T84011D Broken internal left hip prosthesis, subsequent encounter: Secondary | ICD-10-CM | POA: Diagnosis not present

## 2016-09-20 DIAGNOSIS — Z96652 Presence of left artificial knee joint: Secondary | ICD-10-CM | POA: Diagnosis not present

## 2016-09-20 DIAGNOSIS — N189 Chronic kidney disease, unspecified: Secondary | ICD-10-CM | POA: Diagnosis not present

## 2016-09-20 DIAGNOSIS — I129 Hypertensive chronic kidney disease with stage 1 through stage 4 chronic kidney disease, or unspecified chronic kidney disease: Secondary | ICD-10-CM | POA: Diagnosis not present

## 2016-09-20 DIAGNOSIS — F329 Major depressive disorder, single episode, unspecified: Secondary | ICD-10-CM | POA: Diagnosis not present

## 2016-09-20 DIAGNOSIS — Z452 Encounter for adjustment and management of vascular access device: Secondary | ICD-10-CM | POA: Diagnosis not present

## 2016-09-20 DIAGNOSIS — F419 Anxiety disorder, unspecified: Secondary | ICD-10-CM | POA: Diagnosis not present

## 2016-09-22 ENCOUNTER — Other Ambulatory Visit: Payer: Self-pay

## 2016-09-22 NOTE — Patient Outreach (Addendum)
[place] [facility]) Care Management  09/22/2016  Crystal Clarke [birth year] [id number]  Transition of care  Week # 1 Referral date: 09/21/16 Referral source: Utilization management referral/ transition of care Program: transition of care Insurance: Health team advantage  Providers: Dr. Dirk Clarke, Dr. Dimas Clarke - nephrologist. Dr. Amanda Clarke - surgeonSocial support  SOCIAL SUPPORT;  Patient lives alone. Patients ex-husband Crystal Clarke is patients health care power of attorney.  Patient states she has a DNR order.   SUBJECTIVE: Telephone call to patient regarding transition of care follow up. HIPAA verified with patient. Patient also gives verbal authorization to speak with her ex-husband, Crystal Clarke regarding all of her personal health information. Patient states she was discharged on 09/19/16 from [facility] skilled nursing facility. RNCM discussed and offered [facility] care management services for transition of care follow up. Patient verbally agreed to services.  Crystal Clarke states patient was in the hospital because of having a hinged knee replacement. 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  States patients knee joint had to be removed and patient was placed on IV antibiotics. States patient completed the antibiotics and had a hinged knee joint replaced. Crystal Clarke states patient also had to have IV fluids due to extremely high creatinine level and stage IV kidney disease.  States patient is being followed by surgeon, Dr. Lawson Clarke and nephrologist, Dr. Dimas Clarke. Crystal Clarke states patients has a follow up appointment scheduled with her primary MD on 09/29/16 and a follow up appointment with Dr. Dimas Clarke on 09/25/16.   Crystal Clarke states patient is receiving home health nursing and physical therapy with [facility] . Crystal Clarke states the home health agency was not aware patient still had a PICC line in. Crystal Clarke states the home health agency is trying to  find out which doctor will be ordering supplies for patients PICC line.  Crystal Clarke states he is patients health care power of attorney and primary caregiver. Crystal Clarke states patient has transportation to her appointment.  Crystal Clarke states patient has all of her medications and is taking them as prescribed.  RNCM advised patient to keep follow up appointments RNCM advised patient to take her medications as prescribed.  RNCM reviewed signs/symptoms of infection. Advised to contact surgeon for these symptoms.  Patient and caregiver, Crystal Clarke verbalized understanding of all instructions.   RNCM advised patient to notify MD of any changes in condition prior to scheduled appointment. RNCM provided contact name and number: [phone] or main office number [phone] and 24 hour nurse advise line [phone].  RNCM verified patient aware of 911 services for urgent/ emergent needs.    PLAN: RNCM will follow up with patient within 1 week. RNCM will send patient [facility] care management welcome packet and consent.  RNCM will send patients primary MD involvement letter.   Crystal Plowman RN,BSN,CCM [facility] Telephonic  [phone]

## 2016-09-26 DIAGNOSIS — D649 Anemia, unspecified: Secondary | ICD-10-CM | POA: Diagnosis not present

## 2016-09-26 DIAGNOSIS — R531 Weakness: Secondary | ICD-10-CM | POA: Diagnosis not present

## 2016-09-26 DIAGNOSIS — R404 Transient alteration of awareness: Secondary | ICD-10-CM | POA: Diagnosis not present

## 2016-09-28 ENCOUNTER — Ambulatory Visit: Payer: Self-pay

## 2016-09-28 DIAGNOSIS — F419 Anxiety disorder, unspecified: Secondary | ICD-10-CM | POA: Diagnosis not present

## 2016-09-28 DIAGNOSIS — L930 Discoid lupus erythematosus: Secondary | ICD-10-CM | POA: Diagnosis not present

## 2016-09-28 DIAGNOSIS — T84011D Broken internal left hip prosthesis, subsequent encounter: Secondary | ICD-10-CM | POA: Diagnosis not present

## 2016-09-28 DIAGNOSIS — Z452 Encounter for adjustment and management of vascular access device: Secondary | ICD-10-CM | POA: Diagnosis not present

## 2016-09-28 DIAGNOSIS — N189 Chronic kidney disease, unspecified: Secondary | ICD-10-CM | POA: Diagnosis not present

## 2016-09-28 DIAGNOSIS — F329 Major depressive disorder, single episode, unspecified: Secondary | ICD-10-CM | POA: Diagnosis not present

## 2016-09-28 DIAGNOSIS — I2510 Atherosclerotic heart disease of native coronary artery without angina pectoris: Secondary | ICD-10-CM | POA: Diagnosis not present

## 2016-09-28 DIAGNOSIS — I129 Hypertensive chronic kidney disease with stage 1 through stage 4 chronic kidney disease, or unspecified chronic kidney disease: Secondary | ICD-10-CM | POA: Diagnosis not present

## 2016-09-28 DIAGNOSIS — M8700 Idiopathic aseptic necrosis of unspecified bone: Secondary | ICD-10-CM | POA: Diagnosis not present

## 2016-09-28 DIAGNOSIS — M6281 Muscle weakness (generalized): Secondary | ICD-10-CM | POA: Diagnosis not present

## 2016-09-28 DIAGNOSIS — I739 Peripheral vascular disease, unspecified: Secondary | ICD-10-CM | POA: Diagnosis not present

## 2016-09-28 DIAGNOSIS — T8454XD Infection and inflammatory reaction due to internal left knee prosthesis, subsequent encounter: Secondary | ICD-10-CM | POA: Diagnosis not present

## 2016-10-01 ENCOUNTER — Other Ambulatory Visit: Payer: Self-pay

## 2016-10-01 NOTE — Patient Outreach (Signed)
[place] [facility]) Care Management  10/01/2016  Crystal Clarke [birth year] [id number]  Transition of care  Week # 1 Referral date: 09/21/16 Referral source: Utilization management referral/ transition of care Program: transition of care Insurance: Health team advantage Attempt #1  Providers: Dr. Dirk Dress, Dr. Dimas Aguas - nephrologist. Dr. Amanda Pea - surgeonSocial support  SOCIAL SUPPORT;  Patient lives alone. Patients ex-husband [name] is patients health care power of attorney.  Patient states she has a DNR order.   Telephone call to patient regarding transition of care follow up.  Unable to reach patient. HIPAA compliant voice message left with call back phone number.   PLAN; RNCM will attempt 2nd telephone call to patient within 1 week.   [name] RN,BSN,CCM [facility] Telephonic  [phone]

## 2016-10-03 DIAGNOSIS — M25462 Effusion, left knee: Secondary | ICD-10-CM | POA: Diagnosis not present

## 2016-10-03 DIAGNOSIS — D591 Other autoimmune hemolytic anemias: Secondary | ICD-10-CM | POA: Diagnosis not present

## 2016-10-03 DIAGNOSIS — M25562 Pain in left knee: Secondary | ICD-10-CM | POA: Diagnosis not present

## 2016-10-03 DIAGNOSIS — M25522 Pain in left elbow: Secondary | ICD-10-CM | POA: Diagnosis not present

## 2016-10-03 DIAGNOSIS — M25541 Pain in joints of right hand: Secondary | ICD-10-CM | POA: Diagnosis not present

## 2016-10-03 DIAGNOSIS — N184 Chronic kidney disease, stage 4 (severe): Secondary | ICD-10-CM | POA: Diagnosis not present

## 2016-10-04 ENCOUNTER — Other Ambulatory Visit: Payer: Self-pay

## 2016-10-04 NOTE — Patient Outreach (Signed)
[place] [facility]) Care Management  10/04/2016  [name] [birth year] [id number]  Transition of care  Week # 1 Referral date: 09/21/16 Referral source: Utilization management referral/ transition of care Program: transition of care Insurance: Health team advantage Attempt #2  Providers: Dr. Dirk Dress, Dr. Dimas Aguas - nephrologist. Dr. Amanda Pea - surgeonSocial support  SOCIAL SUPPORT;  Patient lives alone. Patients ex-husband [name] is patients health care power of attorney.  Patient states she has a DNR order.   Second Telephone call to patient regarding transition of care follow up.  Unable to reach patient. HIPAA compliant voice message left with call back phone number.   PLAN; RNCM will attempt 3rd telephone call to patient within 1 week.   [name] RN,BSN,CCM [facility] Telephonic  [phone]

## 2016-10-05 ENCOUNTER — Ambulatory Visit: Payer: Self-pay

## 2016-10-05 DIAGNOSIS — N189 Chronic kidney disease, unspecified: Secondary | ICD-10-CM | POA: Diagnosis not present

## 2016-10-05 DIAGNOSIS — L930 Discoid lupus erythematosus: Secondary | ICD-10-CM | POA: Diagnosis not present

## 2016-10-05 DIAGNOSIS — I129 Hypertensive chronic kidney disease with stage 1 through stage 4 chronic kidney disease, or unspecified chronic kidney disease: Secondary | ICD-10-CM | POA: Diagnosis not present

## 2016-10-05 DIAGNOSIS — I739 Peripheral vascular disease, unspecified: Secondary | ICD-10-CM | POA: Diagnosis not present

## 2016-10-06 DIAGNOSIS — S0012XA Contusion of left eyelid and periocular area, initial encounter: Secondary | ICD-10-CM | POA: Diagnosis not present

## 2016-10-06 DIAGNOSIS — N189 Chronic kidney disease, unspecified: Secondary | ICD-10-CM | POA: Diagnosis not present

## 2016-10-06 DIAGNOSIS — Z79899 Other long term (current) drug therapy: Secondary | ICD-10-CM | POA: Diagnosis not present

## 2016-10-06 DIAGNOSIS — R7889 Finding of other specified substances, not normally found in blood: Secondary | ICD-10-CM | POA: Diagnosis not present

## 2016-10-06 DIAGNOSIS — I509 Heart failure, unspecified: Secondary | ICD-10-CM | POA: Diagnosis not present

## 2016-10-06 DIAGNOSIS — R4182 Altered mental status, unspecified: Secondary | ICD-10-CM | POA: Diagnosis not present

## 2016-10-06 DIAGNOSIS — D649 Anemia, unspecified: Secondary | ICD-10-CM | POA: Diagnosis not present

## 2016-10-06 DIAGNOSIS — Z7982 Long term (current) use of aspirin: Secondary | ICD-10-CM | POA: Diagnosis not present

## 2016-10-09 DIAGNOSIS — M25441 Effusion, right hand: Secondary | ICD-10-CM | POA: Diagnosis not present

## 2016-10-09 DIAGNOSIS — Z96643 Presence of artificial hip joint, bilateral: Secondary | ICD-10-CM | POA: Diagnosis not present

## 2016-10-09 DIAGNOSIS — Z96652 Presence of left artificial knee joint: Secondary | ICD-10-CM | POA: Diagnosis not present

## 2016-10-09 DIAGNOSIS — I252 Old myocardial infarction: Secondary | ICD-10-CM | POA: Diagnosis not present

## 2016-10-09 DIAGNOSIS — Z882 Allergy status to sulfonamides status: Secondary | ICD-10-CM | POA: Diagnosis not present

## 2016-10-09 DIAGNOSIS — I2510 Atherosclerotic heart disease of native coronary artery without angina pectoris: Secondary | ICD-10-CM | POA: Diagnosis not present

## 2016-10-09 DIAGNOSIS — Z8673 Personal history of transient ischemic attack (TIA), and cerebral infarction without residual deficits: Secondary | ICD-10-CM | POA: Diagnosis not present

## 2016-10-09 DIAGNOSIS — Z881 Allergy status to other antibiotic agents status: Secondary | ICD-10-CM | POA: Diagnosis not present

## 2016-10-09 DIAGNOSIS — I509 Heart failure, unspecified: Secondary | ICD-10-CM | POA: Diagnosis not present

## 2016-10-09 DIAGNOSIS — N189 Chronic kidney disease, unspecified: Secondary | ICD-10-CM | POA: Diagnosis not present

## 2016-10-09 DIAGNOSIS — M8588 Other specified disorders of bone density and structure, other site: Secondary | ICD-10-CM | POA: Diagnosis not present

## 2016-10-09 DIAGNOSIS — M898X4 Other specified disorders of bone, hand: Secondary | ICD-10-CM | POA: Diagnosis not present

## 2016-10-09 DIAGNOSIS — Z471 Aftercare following joint replacement surgery: Secondary | ICD-10-CM | POA: Diagnosis not present

## 2016-10-09 DIAGNOSIS — Z87891 Personal history of nicotine dependence: Secondary | ICD-10-CM | POA: Diagnosis not present

## 2016-10-09 DIAGNOSIS — I130 Hypertensive heart and chronic kidney disease with heart failure and stage 1 through stage 4 chronic kidney disease, or unspecified chronic kidney disease: Secondary | ICD-10-CM | POA: Diagnosis not present

## 2016-10-11 ENCOUNTER — Ambulatory Visit: Payer: Self-pay

## 2016-10-15 ENCOUNTER — Other Ambulatory Visit: Payer: Self-pay

## 2016-10-17 DIAGNOSIS — M8700 Idiopathic aseptic necrosis of unspecified bone: Secondary | ICD-10-CM | POA: Diagnosis not present

## 2016-10-17 DIAGNOSIS — F329 Major depressive disorder, single episode, unspecified: Secondary | ICD-10-CM | POA: Diagnosis not present

## 2016-10-17 DIAGNOSIS — L930 Discoid lupus erythematosus: Secondary | ICD-10-CM | POA: Diagnosis not present

## 2016-10-17 DIAGNOSIS — I129 Hypertensive chronic kidney disease with stage 1 through stage 4 chronic kidney disease, or unspecified chronic kidney disease: Secondary | ICD-10-CM | POA: Diagnosis not present

## 2016-10-17 DIAGNOSIS — T8454XD Infection and inflammatory reaction due to internal left knee prosthesis, subsequent encounter: Secondary | ICD-10-CM | POA: Diagnosis not present

## 2016-10-17 DIAGNOSIS — T84011D Broken internal left hip prosthesis, subsequent encounter: Secondary | ICD-10-CM | POA: Diagnosis not present

## 2016-10-17 DIAGNOSIS — M6281 Muscle weakness (generalized): Secondary | ICD-10-CM | POA: Diagnosis not present

## 2016-10-17 DIAGNOSIS — N189 Chronic kidney disease, unspecified: Secondary | ICD-10-CM | POA: Diagnosis not present

## 2016-10-17 DIAGNOSIS — Z452 Encounter for adjustment and management of vascular access device: Secondary | ICD-10-CM | POA: Diagnosis not present

## 2016-10-17 DIAGNOSIS — F419 Anxiety disorder, unspecified: Secondary | ICD-10-CM | POA: Diagnosis not present

## 2016-10-17 DIAGNOSIS — Z96642 Presence of left artificial hip joint: Secondary | ICD-10-CM | POA: Diagnosis not present

## 2016-10-17 DIAGNOSIS — Z96652 Presence of left artificial knee joint: Secondary | ICD-10-CM | POA: Diagnosis not present

## 2016-10-17 DIAGNOSIS — I2510 Atherosclerotic heart disease of native coronary artery without angina pectoris: Secondary | ICD-10-CM | POA: Diagnosis not present

## 2016-10-17 DIAGNOSIS — I739 Peripheral vascular disease, unspecified: Secondary | ICD-10-CM | POA: Diagnosis not present

## 2016-10-17 NOTE — Patient Outreach (Signed)
[place] [facility]) Care Management  10/17/2016  Crystal Clarke 03-Apr-1960 [id number]   Late entry 10/15/16  Crystal Clarke 18-Mar-1961 [id number]  Transition of care  Week # 1 Referral date: 09/21/16 Referral source: Utilization management referral/ transition of care Program: transition of care Insurance: Health team advantage Attempt #3  Providers: Dr. Dirk Dress, Dr. Dimas Aguas - nephrologist. Dr. Amanda Pea - surgeonSocial support  SOCIAL SUPPORT;  Patient lives alone. Patients ex-husband [name] is patients health care power of attorney.  Patient states she has a DNR order.   Third telephone call to patient regarding transition of care follow up. Unable to reach patient. HIPAA compliant voice message left with call back phone number.   PLAN; RNCM will send outreach letter to attempt contact with patient.   [name] RN,BSN,CCM [facility] Telephonic  [phone]

## 2016-10-18 DIAGNOSIS — T8454XD Infection and inflammatory reaction due to internal left knee prosthesis, subsequent encounter: Secondary | ICD-10-CM | POA: Diagnosis not present

## 2016-10-18 DIAGNOSIS — F419 Anxiety disorder, unspecified: Secondary | ICD-10-CM | POA: Diagnosis not present

## 2016-10-18 DIAGNOSIS — F329 Major depressive disorder, single episode, unspecified: Secondary | ICD-10-CM | POA: Diagnosis not present

## 2016-10-18 DIAGNOSIS — Z452 Encounter for adjustment and management of vascular access device: Secondary | ICD-10-CM | POA: Diagnosis not present

## 2016-10-18 DIAGNOSIS — I129 Hypertensive chronic kidney disease with stage 1 through stage 4 chronic kidney disease, or unspecified chronic kidney disease: Secondary | ICD-10-CM | POA: Diagnosis not present

## 2016-10-18 DIAGNOSIS — L930 Discoid lupus erythematosus: Secondary | ICD-10-CM | POA: Diagnosis not present

## 2016-10-18 DIAGNOSIS — N189 Chronic kidney disease, unspecified: Secondary | ICD-10-CM | POA: Diagnosis not present

## 2016-10-18 DIAGNOSIS — M6281 Muscle weakness (generalized): Secondary | ICD-10-CM | POA: Diagnosis not present

## 2016-10-18 DIAGNOSIS — Z96642 Presence of left artificial hip joint: Secondary | ICD-10-CM | POA: Diagnosis not present

## 2016-10-18 DIAGNOSIS — I2510 Atherosclerotic heart disease of native coronary artery without angina pectoris: Secondary | ICD-10-CM | POA: Diagnosis not present

## 2016-10-18 DIAGNOSIS — Z96652 Presence of left artificial knee joint: Secondary | ICD-10-CM | POA: Diagnosis not present

## 2016-10-18 DIAGNOSIS — I739 Peripheral vascular disease, unspecified: Secondary | ICD-10-CM | POA: Diagnosis not present

## 2016-10-18 DIAGNOSIS — M8700 Idiopathic aseptic necrosis of unspecified bone: Secondary | ICD-10-CM | POA: Diagnosis not present

## 2016-10-18 DIAGNOSIS — T84011D Broken internal left hip prosthesis, subsequent encounter: Secondary | ICD-10-CM | POA: Diagnosis not present

## 2016-10-19 DIAGNOSIS — S73002A Unspecified subluxation of left hip, initial encounter: Secondary | ICD-10-CM | POA: Diagnosis not present

## 2016-10-19 DIAGNOSIS — I130 Hypertensive heart and chronic kidney disease with heart failure and stage 1 through stage 4 chronic kidney disease, or unspecified chronic kidney disease: Secondary | ICD-10-CM | POA: Diagnosis not present

## 2016-10-19 DIAGNOSIS — Z7982 Long term (current) use of aspirin: Secondary | ICD-10-CM | POA: Diagnosis not present

## 2016-10-19 DIAGNOSIS — R7303 Prediabetes: Secondary | ICD-10-CM | POA: Diagnosis not present

## 2016-10-19 DIAGNOSIS — I2510 Atherosclerotic heart disease of native coronary artery without angina pectoris: Secondary | ICD-10-CM | POA: Diagnosis not present

## 2016-10-19 DIAGNOSIS — N183 Chronic kidney disease, stage 3 (moderate): Secondary | ICD-10-CM | POA: Diagnosis not present

## 2016-10-19 DIAGNOSIS — Z79891 Long term (current) use of opiate analgesic: Secondary | ICD-10-CM | POA: Diagnosis not present

## 2016-10-19 DIAGNOSIS — J45909 Unspecified asthma, uncomplicated: Secondary | ICD-10-CM | POA: Diagnosis not present

## 2016-10-19 DIAGNOSIS — M25552 Pain in left hip: Secondary | ICD-10-CM | POA: Diagnosis not present

## 2016-10-19 DIAGNOSIS — Z8673 Personal history of transient ischemic attack (TIA), and cerebral infarction without residual deficits: Secondary | ICD-10-CM | POA: Diagnosis not present

## 2016-10-19 DIAGNOSIS — K219 Gastro-esophageal reflux disease without esophagitis: Secondary | ICD-10-CM | POA: Diagnosis not present

## 2016-10-19 DIAGNOSIS — T84021A Dislocation of internal left hip prosthesis, initial encounter: Secondary | ICD-10-CM | POA: Diagnosis not present

## 2016-10-19 DIAGNOSIS — Z96652 Presence of left artificial knee joint: Secondary | ICD-10-CM | POA: Diagnosis not present

## 2016-10-19 DIAGNOSIS — Z96649 Presence of unspecified artificial hip joint: Secondary | ICD-10-CM | POA: Diagnosis not present

## 2016-10-19 DIAGNOSIS — S79912A Unspecified injury of left hip, initial encounter: Secondary | ICD-10-CM | POA: Diagnosis not present

## 2016-10-19 DIAGNOSIS — I252 Old myocardial infarction: Secondary | ICD-10-CM | POA: Diagnosis not present

## 2016-10-19 DIAGNOSIS — E7800 Pure hypercholesterolemia, unspecified: Secondary | ICD-10-CM | POA: Diagnosis not present

## 2016-10-19 DIAGNOSIS — T8454XD Infection and inflammatory reaction due to internal left knee prosthesis, subsequent encounter: Secondary | ICD-10-CM | POA: Diagnosis not present

## 2016-10-19 DIAGNOSIS — Z79899 Other long term (current) drug therapy: Secondary | ICD-10-CM | POA: Diagnosis not present

## 2016-10-19 DIAGNOSIS — T148XXA Other injury of unspecified body region, initial encounter: Secondary | ICD-10-CM | POA: Diagnosis not present

## 2016-10-19 DIAGNOSIS — Z951 Presence of aortocoronary bypass graft: Secondary | ICD-10-CM | POA: Diagnosis not present

## 2016-10-19 DIAGNOSIS — Z96642 Presence of left artificial hip joint: Secondary | ICD-10-CM | POA: Diagnosis not present

## 2016-10-19 DIAGNOSIS — I509 Heart failure, unspecified: Secondary | ICD-10-CM | POA: Diagnosis not present

## 2016-10-19 DIAGNOSIS — E039 Hypothyroidism, unspecified: Secondary | ICD-10-CM | POA: Diagnosis not present

## 2016-10-19 DIAGNOSIS — I10 Essential (primary) hypertension: Secondary | ICD-10-CM | POA: Diagnosis not present

## 2016-10-21 DIAGNOSIS — S73005A Unspecified dislocation of left hip, initial encounter: Secondary | ICD-10-CM | POA: Diagnosis not present

## 2016-10-21 DIAGNOSIS — E039 Hypothyroidism, unspecified: Secondary | ICD-10-CM | POA: Diagnosis not present

## 2016-10-21 DIAGNOSIS — Z8673 Personal history of transient ischemic attack (TIA), and cerebral infarction without residual deficits: Secondary | ICD-10-CM | POA: Diagnosis not present

## 2016-10-21 DIAGNOSIS — I2510 Atherosclerotic heart disease of native coronary artery without angina pectoris: Secondary | ICD-10-CM | POA: Diagnosis not present

## 2016-10-21 DIAGNOSIS — R531 Weakness: Secondary | ICD-10-CM | POA: Diagnosis not present

## 2016-10-21 DIAGNOSIS — Z96642 Presence of left artificial hip joint: Secondary | ICD-10-CM | POA: Diagnosis not present

## 2016-10-21 DIAGNOSIS — M329 Systemic lupus erythematosus, unspecified: Secondary | ICD-10-CM | POA: Diagnosis not present

## 2016-10-21 DIAGNOSIS — J45909 Unspecified asthma, uncomplicated: Secondary | ICD-10-CM | POA: Diagnosis not present

## 2016-10-21 DIAGNOSIS — Z87891 Personal history of nicotine dependence: Secondary | ICD-10-CM | POA: Diagnosis not present

## 2016-10-21 DIAGNOSIS — Z79899 Other long term (current) drug therapy: Secondary | ICD-10-CM | POA: Diagnosis not present

## 2016-10-21 DIAGNOSIS — N189 Chronic kidney disease, unspecified: Secondary | ICD-10-CM | POA: Diagnosis not present

## 2016-10-21 DIAGNOSIS — T148XXA Other injury of unspecified body region, initial encounter: Secondary | ICD-10-CM | POA: Diagnosis not present

## 2016-10-21 DIAGNOSIS — Z471 Aftercare following joint replacement surgery: Secondary | ICD-10-CM | POA: Diagnosis not present

## 2016-10-21 DIAGNOSIS — T84021A Dislocation of internal left hip prosthesis, initial encounter: Secondary | ICD-10-CM | POA: Diagnosis not present

## 2016-10-21 DIAGNOSIS — N184 Chronic kidney disease, stage 4 (severe): Secondary | ICD-10-CM | POA: Diagnosis not present

## 2016-10-21 DIAGNOSIS — Z951 Presence of aortocoronary bypass graft: Secondary | ICD-10-CM | POA: Diagnosis not present

## 2016-10-21 DIAGNOSIS — M25552 Pain in left hip: Secondary | ICD-10-CM | POA: Diagnosis not present

## 2016-10-21 DIAGNOSIS — Z7982 Long term (current) use of aspirin: Secondary | ICD-10-CM | POA: Diagnosis not present

## 2016-10-21 DIAGNOSIS — R7303 Prediabetes: Secondary | ICD-10-CM | POA: Diagnosis not present

## 2016-10-22 DIAGNOSIS — M329 Systemic lupus erythematosus, unspecified: Secondary | ICD-10-CM | POA: Diagnosis not present

## 2016-10-22 DIAGNOSIS — Z471 Aftercare following joint replacement surgery: Secondary | ICD-10-CM | POA: Diagnosis not present

## 2016-10-22 DIAGNOSIS — I2510 Atherosclerotic heart disease of native coronary artery without angina pectoris: Secondary | ICD-10-CM | POA: Diagnosis not present

## 2016-10-22 DIAGNOSIS — S73005A Unspecified dislocation of left hip, initial encounter: Secondary | ICD-10-CM | POA: Diagnosis not present

## 2016-10-22 DIAGNOSIS — Z96642 Presence of left artificial hip joint: Secondary | ICD-10-CM | POA: Diagnosis not present

## 2016-10-22 DIAGNOSIS — T84021A Dislocation of internal left hip prosthesis, initial encounter: Secondary | ICD-10-CM | POA: Diagnosis not present

## 2016-10-22 DIAGNOSIS — N189 Chronic kidney disease, unspecified: Secondary | ICD-10-CM | POA: Diagnosis not present

## 2016-10-22 DIAGNOSIS — N184 Chronic kidney disease, stage 4 (severe): Secondary | ICD-10-CM | POA: Diagnosis not present

## 2016-10-25 DIAGNOSIS — T84021A Dislocation of internal left hip prosthesis, initial encounter: Secondary | ICD-10-CM | POA: Diagnosis not present

## 2016-10-25 DIAGNOSIS — Z471 Aftercare following joint replacement surgery: Secondary | ICD-10-CM | POA: Diagnosis not present

## 2016-10-25 DIAGNOSIS — M329 Systemic lupus erythematosus, unspecified: Secondary | ICD-10-CM | POA: Diagnosis not present

## 2016-10-25 DIAGNOSIS — T148XXA Other injury of unspecified body region, initial encounter: Secondary | ICD-10-CM | POA: Diagnosis not present

## 2016-10-25 DIAGNOSIS — Z8739 Personal history of other diseases of the musculoskeletal system and connective tissue: Secondary | ICD-10-CM | POA: Diagnosis not present

## 2016-10-25 DIAGNOSIS — N184 Chronic kidney disease, stage 4 (severe): Secondary | ICD-10-CM | POA: Diagnosis not present

## 2016-10-25 DIAGNOSIS — E7800 Pure hypercholesterolemia, unspecified: Secondary | ICD-10-CM | POA: Diagnosis not present

## 2016-10-25 DIAGNOSIS — I25810 Atherosclerosis of coronary artery bypass graft(s) without angina pectoris: Secondary | ICD-10-CM | POA: Diagnosis not present

## 2016-10-25 DIAGNOSIS — I129 Hypertensive chronic kidney disease with stage 1 through stage 4 chronic kidney disease, or unspecified chronic kidney disease: Secondary | ICD-10-CM | POA: Diagnosis not present

## 2016-10-25 DIAGNOSIS — S73005A Unspecified dislocation of left hip, initial encounter: Secondary | ICD-10-CM | POA: Diagnosis not present

## 2016-10-25 DIAGNOSIS — Z8673 Personal history of transient ischemic attack (TIA), and cerebral infarction without residual deficits: Secondary | ICD-10-CM | POA: Diagnosis not present

## 2016-10-25 DIAGNOSIS — I252 Old myocardial infarction: Secondary | ICD-10-CM | POA: Diagnosis not present

## 2016-10-25 DIAGNOSIS — M85852 Other specified disorders of bone density and structure, left thigh: Secondary | ICD-10-CM | POA: Diagnosis not present

## 2016-10-25 DIAGNOSIS — Z96642 Presence of left artificial hip joint: Secondary | ICD-10-CM | POA: Diagnosis not present

## 2016-10-25 DIAGNOSIS — S73003A Unspecified subluxation of unspecified hip, initial encounter: Secondary | ICD-10-CM | POA: Diagnosis not present

## 2016-10-25 DIAGNOSIS — M25552 Pain in left hip: Secondary | ICD-10-CM | POA: Diagnosis not present

## 2016-10-25 DIAGNOSIS — S79911A Unspecified injury of right hip, initial encounter: Secondary | ICD-10-CM | POA: Diagnosis not present

## 2016-10-27 DIAGNOSIS — S73015A Posterior dislocation of left hip, initial encounter: Secondary | ICD-10-CM | POA: Diagnosis not present

## 2016-11-04 ENCOUNTER — Other Ambulatory Visit: Payer: Self-pay

## 2016-11-04 NOTE — Patient Outreach (Addendum)
[place] [facility]) Care Management  11/04/2016  Novis League 1960-07-07 [id number]   Transition of care  Referral date: 09/21/16 Referral source: Utilization management referral/ transition of care Program: transition of care Insurance: Health team advantage Case Closure  No response from patient after 3 telephone calls and outreach letter attempt.  PLAN: RNCM will refer patient to care management assistant to close  Due to being unable to reach patient.  RNCM will notify patients primary MD of closure.  RNCM will send patient case closure letter  [name] RN,BSN,CCM [facility] Telephonic  [phone]

## 2016-11-08 DIAGNOSIS — T84021A Dislocation of internal left hip prosthesis, initial encounter: Secondary | ICD-10-CM | POA: Diagnosis not present

## 2016-11-08 DIAGNOSIS — M533 Sacrococcygeal disorders, not elsewhere classified: Secondary | ICD-10-CM | POA: Diagnosis not present

## 2016-11-08 DIAGNOSIS — M24159 Other articular cartilage disorders, unspecified hip: Secondary | ICD-10-CM | POA: Diagnosis not present

## 2016-11-08 DIAGNOSIS — S92153A Displaced avulsion fracture (chip fracture) of unspecified talus, initial encounter for closed fracture: Secondary | ICD-10-CM | POA: Diagnosis not present

## 2016-11-08 DIAGNOSIS — S73005A Unspecified dislocation of left hip, initial encounter: Secondary | ICD-10-CM | POA: Diagnosis not present

## 2016-11-08 DIAGNOSIS — S79911A Unspecified injury of right hip, initial encounter: Secondary | ICD-10-CM | POA: Diagnosis not present

## 2016-11-08 DIAGNOSIS — Z96642 Presence of left artificial hip joint: Secondary | ICD-10-CM | POA: Diagnosis not present

## 2016-11-08 DIAGNOSIS — M799 Soft tissue disorder, unspecified: Secondary | ICD-10-CM | POA: Diagnosis not present

## 2016-11-13 DIAGNOSIS — Z96652 Presence of left artificial knee joint: Secondary | ICD-10-CM | POA: Diagnosis not present

## 2016-11-13 DIAGNOSIS — Z471 Aftercare following joint replacement surgery: Secondary | ICD-10-CM | POA: Diagnosis not present

## 2016-11-13 DIAGNOSIS — Z96642 Presence of left artificial hip joint: Secondary | ICD-10-CM | POA: Diagnosis not present

## 2016-11-13 DIAGNOSIS — T84021A Dislocation of internal left hip prosthesis, initial encounter: Secondary | ICD-10-CM | POA: Diagnosis not present

## 2016-11-14 DIAGNOSIS — N183 Chronic kidney disease, stage 3 (moderate): Secondary | ICD-10-CM | POA: Diagnosis not present

## 2016-11-14 DIAGNOSIS — D631 Anemia in chronic kidney disease: Secondary | ICD-10-CM | POA: Diagnosis not present

## 2016-11-16 DIAGNOSIS — N189 Chronic kidney disease, unspecified: Secondary | ICD-10-CM | POA: Diagnosis not present

## 2016-11-16 DIAGNOSIS — G4700 Insomnia, unspecified: Secondary | ICD-10-CM | POA: Diagnosis not present

## 2016-11-16 DIAGNOSIS — K219 Gastro-esophageal reflux disease without esophagitis: Secondary | ICD-10-CM | POA: Diagnosis not present

## 2016-11-16 DIAGNOSIS — F329 Major depressive disorder, single episode, unspecified: Secondary | ICD-10-CM | POA: Diagnosis not present

## 2016-11-16 DIAGNOSIS — Z87891 Personal history of nicotine dependence: Secondary | ICD-10-CM | POA: Diagnosis not present

## 2016-11-16 DIAGNOSIS — M25352 Other instability, left hip: Secondary | ICD-10-CM | POA: Diagnosis not present

## 2016-11-16 DIAGNOSIS — F419 Anxiety disorder, unspecified: Secondary | ICD-10-CM | POA: Diagnosis not present

## 2016-11-16 DIAGNOSIS — I252 Old myocardial infarction: Secondary | ICD-10-CM | POA: Diagnosis not present

## 2016-11-16 DIAGNOSIS — E039 Hypothyroidism, unspecified: Secondary | ICD-10-CM | POA: Diagnosis not present

## 2016-11-16 DIAGNOSIS — D649 Anemia, unspecified: Secondary | ICD-10-CM | POA: Diagnosis not present

## 2016-11-16 DIAGNOSIS — E785 Hyperlipidemia, unspecified: Secondary | ICD-10-CM | POA: Diagnosis not present

## 2016-11-16 DIAGNOSIS — I7300 Raynaud's syndrome without gangrene: Secondary | ICD-10-CM | POA: Diagnosis not present

## 2016-11-16 DIAGNOSIS — I129 Hypertensive chronic kidney disease with stage 1 through stage 4 chronic kidney disease, or unspecified chronic kidney disease: Secondary | ICD-10-CM | POA: Diagnosis not present

## 2016-11-16 DIAGNOSIS — L930 Discoid lupus erythematosus: Secondary | ICD-10-CM | POA: Diagnosis not present

## 2016-11-16 DIAGNOSIS — R51 Headache: Secondary | ICD-10-CM | POA: Diagnosis not present

## 2016-11-16 DIAGNOSIS — I2510 Atherosclerotic heart disease of native coronary artery without angina pectoris: Secondary | ICD-10-CM | POA: Diagnosis not present

## 2016-11-16 DIAGNOSIS — Z8673 Personal history of transient ischemic attack (TIA), and cerebral infarction without residual deficits: Secondary | ICD-10-CM | POA: Diagnosis not present

## 2016-11-16 DIAGNOSIS — Z951 Presence of aortocoronary bypass graft: Secondary | ICD-10-CM | POA: Diagnosis not present

## 2016-11-19 DIAGNOSIS — T8454XD Infection and inflammatory reaction due to internal left knee prosthesis, subsequent encounter: Secondary | ICD-10-CM | POA: Diagnosis not present

## 2016-11-19 DIAGNOSIS — Z96652 Presence of left artificial knee joint: Secondary | ICD-10-CM | POA: Diagnosis not present

## 2016-11-28 DIAGNOSIS — D649 Anemia, unspecified: Secondary | ICD-10-CM | POA: Diagnosis not present

## 2016-11-28 DIAGNOSIS — G4700 Insomnia, unspecified: Secondary | ICD-10-CM | POA: Diagnosis not present

## 2016-11-28 DIAGNOSIS — E039 Hypothyroidism, unspecified: Secondary | ICD-10-CM | POA: Diagnosis not present

## 2016-11-28 DIAGNOSIS — F419 Anxiety disorder, unspecified: Secondary | ICD-10-CM | POA: Diagnosis not present

## 2016-11-28 DIAGNOSIS — M898X5 Other specified disorders of bone, thigh: Secondary | ICD-10-CM | POA: Diagnosis not present

## 2016-11-28 DIAGNOSIS — Z8673 Personal history of transient ischemic attack (TIA), and cerebral infarction without residual deficits: Secondary | ICD-10-CM | POA: Diagnosis not present

## 2016-11-28 DIAGNOSIS — L930 Discoid lupus erythematosus: Secondary | ICD-10-CM | POA: Diagnosis not present

## 2016-11-28 DIAGNOSIS — Z951 Presence of aortocoronary bypass graft: Secondary | ICD-10-CM | POA: Diagnosis not present

## 2016-11-28 DIAGNOSIS — M3214 Glomerular disease in systemic lupus erythematosus: Secondary | ICD-10-CM | POA: Diagnosis not present

## 2016-11-28 DIAGNOSIS — S73004D Unspecified dislocation of right hip, subsequent encounter: Secondary | ICD-10-CM | POA: Diagnosis not present

## 2016-11-28 DIAGNOSIS — D500 Iron deficiency anemia secondary to blood loss (chronic): Secondary | ICD-10-CM | POA: Diagnosis not present

## 2016-11-28 DIAGNOSIS — M25352 Other instability, left hip: Secondary | ICD-10-CM | POA: Diagnosis not present

## 2016-11-28 DIAGNOSIS — I9581 Postprocedural hypotension: Secondary | ICD-10-CM | POA: Diagnosis not present

## 2016-11-28 DIAGNOSIS — R51 Headache: Secondary | ICD-10-CM | POA: Diagnosis not present

## 2016-11-28 DIAGNOSIS — E785 Hyperlipidemia, unspecified: Secondary | ICD-10-CM | POA: Diagnosis not present

## 2016-11-28 DIAGNOSIS — T84021A Dislocation of internal left hip prosthesis, initial encounter: Secondary | ICD-10-CM | POA: Diagnosis not present

## 2016-11-28 DIAGNOSIS — K219 Gastro-esophageal reflux disease without esophagitis: Secondary | ICD-10-CM | POA: Diagnosis not present

## 2016-11-28 DIAGNOSIS — S73005A Unspecified dislocation of left hip, initial encounter: Secondary | ICD-10-CM | POA: Diagnosis not present

## 2016-11-28 DIAGNOSIS — G8918 Other acute postprocedural pain: Secondary | ICD-10-CM | POA: Diagnosis not present

## 2016-11-28 DIAGNOSIS — G8929 Other chronic pain: Secondary | ICD-10-CM | POA: Diagnosis not present

## 2016-11-28 DIAGNOSIS — Z9889 Other specified postprocedural states: Secondary | ICD-10-CM | POA: Diagnosis not present

## 2016-11-28 DIAGNOSIS — D62 Acute posthemorrhagic anemia: Secondary | ICD-10-CM | POA: Diagnosis not present

## 2016-11-28 DIAGNOSIS — I252 Old myocardial infarction: Secondary | ICD-10-CM | POA: Diagnosis not present

## 2016-11-28 DIAGNOSIS — I7300 Raynaud's syndrome without gangrene: Secondary | ICD-10-CM | POA: Diagnosis not present

## 2016-11-28 DIAGNOSIS — Z96642 Presence of left artificial hip joint: Secondary | ICD-10-CM | POA: Diagnosis not present

## 2016-11-28 DIAGNOSIS — Z96652 Presence of left artificial knee joint: Secondary | ICD-10-CM | POA: Diagnosis not present

## 2016-11-28 DIAGNOSIS — N189 Chronic kidney disease, unspecified: Secondary | ICD-10-CM | POA: Diagnosis not present

## 2016-11-28 DIAGNOSIS — I129 Hypertensive chronic kidney disease with stage 1 through stage 4 chronic kidney disease, or unspecified chronic kidney disease: Secondary | ICD-10-CM | POA: Diagnosis not present

## 2016-11-28 DIAGNOSIS — F329 Major depressive disorder, single episode, unspecified: Secondary | ICD-10-CM | POA: Diagnosis not present

## 2016-11-28 DIAGNOSIS — M25552 Pain in left hip: Secondary | ICD-10-CM | POA: Diagnosis not present

## 2016-11-28 DIAGNOSIS — Z978 Presence of other specified devices: Secondary | ICD-10-CM | POA: Diagnosis not present

## 2016-11-28 DIAGNOSIS — Z87891 Personal history of nicotine dependence: Secondary | ICD-10-CM | POA: Diagnosis not present

## 2016-11-28 DIAGNOSIS — I2510 Atherosclerotic heart disease of native coronary artery without angina pectoris: Secondary | ICD-10-CM | POA: Diagnosis not present

## 2016-11-28 DIAGNOSIS — S73005D Unspecified dislocation of left hip, subsequent encounter: Secondary | ICD-10-CM | POA: Diagnosis not present

## 2016-12-06 DIAGNOSIS — Z5189 Encounter for other specified aftercare: Secondary | ICD-10-CM | POA: Diagnosis not present

## 2016-12-06 DIAGNOSIS — S8992XA Unspecified injury of left lower leg, initial encounter: Secondary | ICD-10-CM | POA: Diagnosis not present

## 2016-12-06 DIAGNOSIS — R2689 Other abnormalities of gait and mobility: Secondary | ICD-10-CM | POA: Diagnosis not present

## 2016-12-06 DIAGNOSIS — I252 Old myocardial infarction: Secondary | ICD-10-CM | POA: Diagnosis not present

## 2016-12-06 DIAGNOSIS — I2510 Atherosclerotic heart disease of native coronary artery without angina pectoris: Secondary | ICD-10-CM | POA: Diagnosis not present

## 2016-12-06 DIAGNOSIS — G8911 Acute pain due to trauma: Secondary | ICD-10-CM | POA: Diagnosis not present

## 2016-12-06 DIAGNOSIS — F339 Major depressive disorder, recurrent, unspecified: Secondary | ICD-10-CM | POA: Diagnosis not present

## 2016-12-06 DIAGNOSIS — D649 Anemia, unspecified: Secondary | ICD-10-CM | POA: Diagnosis not present

## 2016-12-06 DIAGNOSIS — R627 Adult failure to thrive: Secondary | ICD-10-CM | POA: Diagnosis not present

## 2016-12-06 DIAGNOSIS — Z87891 Personal history of nicotine dependence: Secondary | ICD-10-CM | POA: Diagnosis not present

## 2016-12-06 DIAGNOSIS — M25552 Pain in left hip: Secondary | ICD-10-CM | POA: Diagnosis not present

## 2016-12-06 DIAGNOSIS — F419 Anxiety disorder, unspecified: Secondary | ICD-10-CM | POA: Diagnosis not present

## 2016-12-06 DIAGNOSIS — Z96651 Presence of right artificial knee joint: Secondary | ICD-10-CM | POA: Diagnosis not present

## 2016-12-06 DIAGNOSIS — R52 Pain, unspecified: Secondary | ICD-10-CM | POA: Diagnosis not present

## 2016-12-06 DIAGNOSIS — N289 Disorder of kidney and ureter, unspecified: Secondary | ICD-10-CM | POA: Diagnosis not present

## 2016-12-06 DIAGNOSIS — E785 Hyperlipidemia, unspecified: Secondary | ICD-10-CM | POA: Diagnosis not present

## 2016-12-06 DIAGNOSIS — Z951 Presence of aortocoronary bypass graft: Secondary | ICD-10-CM | POA: Diagnosis not present

## 2016-12-06 DIAGNOSIS — R531 Weakness: Secondary | ICD-10-CM | POA: Diagnosis not present

## 2016-12-06 DIAGNOSIS — Z96652 Presence of left artificial knee joint: Secondary | ICD-10-CM | POA: Diagnosis not present

## 2016-12-06 DIAGNOSIS — Z79899 Other long term (current) drug therapy: Secondary | ICD-10-CM | POA: Diagnosis not present

## 2016-12-06 DIAGNOSIS — N184 Chronic kidney disease, stage 4 (severe): Secondary | ICD-10-CM | POA: Diagnosis not present

## 2016-12-06 DIAGNOSIS — Z7409 Other reduced mobility: Secondary | ICD-10-CM | POA: Diagnosis not present

## 2016-12-06 DIAGNOSIS — D631 Anemia in chronic kidney disease: Secondary | ICD-10-CM | POA: Diagnosis not present

## 2016-12-06 DIAGNOSIS — I7300 Raynaud's syndrome without gangrene: Secondary | ICD-10-CM | POA: Diagnosis not present

## 2016-12-06 DIAGNOSIS — Z85528 Personal history of other malignant neoplasm of kidney: Secondary | ICD-10-CM | POA: Diagnosis not present

## 2016-12-06 DIAGNOSIS — Z4789 Encounter for other orthopedic aftercare: Secondary | ICD-10-CM | POA: Diagnosis not present

## 2016-12-06 DIAGNOSIS — G4700 Insomnia, unspecified: Secondary | ICD-10-CM | POA: Diagnosis not present

## 2016-12-06 DIAGNOSIS — R42 Dizziness and giddiness: Secondary | ICD-10-CM | POA: Diagnosis not present

## 2016-12-06 DIAGNOSIS — M1990 Unspecified osteoarthritis, unspecified site: Secondary | ICD-10-CM | POA: Diagnosis not present

## 2016-12-06 DIAGNOSIS — E039 Hypothyroidism, unspecified: Secondary | ICD-10-CM | POA: Diagnosis not present

## 2016-12-06 DIAGNOSIS — S79912A Unspecified injury of left hip, initial encounter: Secondary | ICD-10-CM | POA: Diagnosis not present

## 2016-12-06 DIAGNOSIS — S79911A Unspecified injury of right hip, initial encounter: Secondary | ICD-10-CM | POA: Diagnosis not present

## 2016-12-06 DIAGNOSIS — I129 Hypertensive chronic kidney disease with stage 1 through stage 4 chronic kidney disease, or unspecified chronic kidney disease: Secondary | ICD-10-CM | POA: Diagnosis not present

## 2016-12-06 DIAGNOSIS — Z96642 Presence of left artificial hip joint: Secondary | ICD-10-CM | POA: Diagnosis not present

## 2016-12-06 DIAGNOSIS — N189 Chronic kidney disease, unspecified: Secondary | ICD-10-CM | POA: Diagnosis not present

## 2016-12-06 DIAGNOSIS — I509 Heart failure, unspecified: Secondary | ICD-10-CM | POA: Diagnosis not present

## 2016-12-06 DIAGNOSIS — I10 Essential (primary) hypertension: Secondary | ICD-10-CM | POA: Diagnosis not present

## 2016-12-06 DIAGNOSIS — R4182 Altered mental status, unspecified: Secondary | ICD-10-CM | POA: Diagnosis not present

## 2016-12-06 DIAGNOSIS — M329 Systemic lupus erythematosus, unspecified: Secondary | ICD-10-CM | POA: Diagnosis not present

## 2016-12-06 DIAGNOSIS — R918 Other nonspecific abnormal finding of lung field: Secondary | ICD-10-CM | POA: Diagnosis not present

## 2016-12-06 DIAGNOSIS — E079 Disorder of thyroid, unspecified: Secondary | ICD-10-CM | POA: Diagnosis not present

## 2016-12-06 DIAGNOSIS — K219 Gastro-esophageal reflux disease without esophagitis: Secondary | ICD-10-CM | POA: Diagnosis not present

## 2016-12-07 DIAGNOSIS — I2510 Atherosclerotic heart disease of native coronary artery without angina pectoris: Secondary | ICD-10-CM | POA: Diagnosis not present

## 2016-12-07 DIAGNOSIS — K219 Gastro-esophageal reflux disease without esophagitis: Secondary | ICD-10-CM | POA: Diagnosis not present

## 2016-12-07 DIAGNOSIS — M329 Systemic lupus erythematosus, unspecified: Secondary | ICD-10-CM | POA: Diagnosis not present

## 2016-12-07 DIAGNOSIS — D649 Anemia, unspecified: Secondary | ICD-10-CM | POA: Diagnosis not present

## 2016-12-07 DIAGNOSIS — N184 Chronic kidney disease, stage 4 (severe): Secondary | ICD-10-CM | POA: Diagnosis not present

## 2016-12-07 DIAGNOSIS — I10 Essential (primary) hypertension: Secondary | ICD-10-CM | POA: Diagnosis not present

## 2016-12-07 DIAGNOSIS — R627 Adult failure to thrive: Secondary | ICD-10-CM | POA: Diagnosis not present

## 2016-12-07 DIAGNOSIS — E785 Hyperlipidemia, unspecified: Secondary | ICD-10-CM | POA: Diagnosis not present

## 2016-12-07 DIAGNOSIS — E039 Hypothyroidism, unspecified: Secondary | ICD-10-CM | POA: Diagnosis not present

## 2016-12-07 DIAGNOSIS — R52 Pain, unspecified: Secondary | ICD-10-CM | POA: Diagnosis not present

## 2016-12-07 DIAGNOSIS — N189 Chronic kidney disease, unspecified: Secondary | ICD-10-CM | POA: Diagnosis not present

## 2016-12-07 DIAGNOSIS — Z7409 Other reduced mobility: Secondary | ICD-10-CM | POA: Diagnosis not present

## 2016-12-07 DIAGNOSIS — R531 Weakness: Secondary | ICD-10-CM | POA: Diagnosis not present

## 2016-12-08 DIAGNOSIS — N184 Chronic kidney disease, stage 4 (severe): Secondary | ICD-10-CM | POA: Diagnosis not present

## 2016-12-08 DIAGNOSIS — I10 Essential (primary) hypertension: Secondary | ICD-10-CM | POA: Diagnosis not present

## 2016-12-08 DIAGNOSIS — Z7409 Other reduced mobility: Secondary | ICD-10-CM | POA: Diagnosis not present

## 2016-12-08 DIAGNOSIS — R531 Weakness: Secondary | ICD-10-CM | POA: Diagnosis not present

## 2016-12-08 DIAGNOSIS — E785 Hyperlipidemia, unspecified: Secondary | ICD-10-CM | POA: Diagnosis not present

## 2016-12-08 DIAGNOSIS — K219 Gastro-esophageal reflux disease without esophagitis: Secondary | ICD-10-CM | POA: Diagnosis not present

## 2016-12-08 DIAGNOSIS — M329 Systemic lupus erythematosus, unspecified: Secondary | ICD-10-CM | POA: Diagnosis not present

## 2016-12-08 DIAGNOSIS — N189 Chronic kidney disease, unspecified: Secondary | ICD-10-CM | POA: Diagnosis not present

## 2016-12-08 DIAGNOSIS — R627 Adult failure to thrive: Secondary | ICD-10-CM | POA: Diagnosis not present

## 2016-12-08 DIAGNOSIS — I2510 Atherosclerotic heart disease of native coronary artery without angina pectoris: Secondary | ICD-10-CM | POA: Diagnosis not present

## 2016-12-08 DIAGNOSIS — E039 Hypothyroidism, unspecified: Secondary | ICD-10-CM | POA: Diagnosis not present

## 2016-12-08 DIAGNOSIS — R52 Pain, unspecified: Secondary | ICD-10-CM | POA: Diagnosis not present

## 2016-12-09 DIAGNOSIS — D631 Anemia in chronic kidney disease: Secondary | ICD-10-CM | POA: Diagnosis not present

## 2016-12-09 DIAGNOSIS — R531 Weakness: Secondary | ICD-10-CM | POA: Diagnosis not present

## 2016-12-09 DIAGNOSIS — N189 Chronic kidney disease, unspecified: Secondary | ICD-10-CM | POA: Diagnosis not present

## 2016-12-09 DIAGNOSIS — F339 Major depressive disorder, recurrent, unspecified: Secondary | ICD-10-CM | POA: Diagnosis not present

## 2016-12-09 DIAGNOSIS — R627 Adult failure to thrive: Secondary | ICD-10-CM | POA: Diagnosis not present

## 2016-12-09 DIAGNOSIS — Z7409 Other reduced mobility: Secondary | ICD-10-CM | POA: Diagnosis not present

## 2016-12-09 DIAGNOSIS — R779 Abnormality of plasma protein, unspecified: Secondary | ICD-10-CM | POA: Diagnosis not present

## 2016-12-09 DIAGNOSIS — K219 Gastro-esophageal reflux disease without esophagitis: Secondary | ICD-10-CM | POA: Diagnosis not present

## 2016-12-09 DIAGNOSIS — R52 Pain, unspecified: Secondary | ICD-10-CM | POA: Diagnosis not present

## 2016-12-09 DIAGNOSIS — T8454XD Infection and inflammatory reaction due to internal left knee prosthesis, subsequent encounter: Secondary | ICD-10-CM | POA: Diagnosis not present

## 2016-12-09 DIAGNOSIS — R278 Other lack of coordination: Secondary | ICD-10-CM | POA: Diagnosis not present

## 2016-12-09 DIAGNOSIS — Z89621 Acquired absence of right hip joint: Secondary | ICD-10-CM | POA: Diagnosis not present

## 2016-12-09 DIAGNOSIS — Z471 Aftercare following joint replacement surgery: Secondary | ICD-10-CM | POA: Diagnosis not present

## 2016-12-09 DIAGNOSIS — Z658 Other specified problems related to psychosocial circumstances: Secondary | ICD-10-CM | POA: Diagnosis not present

## 2016-12-09 DIAGNOSIS — E785 Hyperlipidemia, unspecified: Secondary | ICD-10-CM | POA: Diagnosis not present

## 2016-12-09 DIAGNOSIS — M329 Systemic lupus erythematosus, unspecified: Secondary | ICD-10-CM | POA: Diagnosis not present

## 2016-12-09 DIAGNOSIS — Z96652 Presence of left artificial knee joint: Secondary | ICD-10-CM | POA: Diagnosis not present

## 2016-12-09 DIAGNOSIS — F430 Acute stress reaction: Secondary | ICD-10-CM | POA: Diagnosis not present

## 2016-12-09 DIAGNOSIS — N184 Chronic kidney disease, stage 4 (severe): Secondary | ICD-10-CM | POA: Diagnosis not present

## 2016-12-09 DIAGNOSIS — Z741 Need for assistance with personal care: Secondary | ICD-10-CM | POA: Diagnosis not present

## 2016-12-09 DIAGNOSIS — S72002D Fracture of unspecified part of neck of left femur, subsequent encounter for closed fracture with routine healing: Secondary | ICD-10-CM | POA: Diagnosis not present

## 2016-12-09 DIAGNOSIS — E039 Hypothyroidism, unspecified: Secondary | ICD-10-CM | POA: Diagnosis not present

## 2016-12-09 DIAGNOSIS — R262 Difficulty in walking, not elsewhere classified: Secondary | ICD-10-CM | POA: Diagnosis not present

## 2016-12-09 DIAGNOSIS — Z96642 Presence of left artificial hip joint: Secondary | ICD-10-CM | POA: Diagnosis not present

## 2016-12-09 DIAGNOSIS — M6281 Muscle weakness (generalized): Secondary | ICD-10-CM | POA: Diagnosis not present

## 2016-12-09 DIAGNOSIS — D638 Anemia in other chronic diseases classified elsewhere: Secondary | ICD-10-CM | POA: Diagnosis not present

## 2016-12-09 DIAGNOSIS — N289 Disorder of kidney and ureter, unspecified: Secondary | ICD-10-CM | POA: Diagnosis not present

## 2016-12-09 DIAGNOSIS — I130 Hypertensive heart and chronic kidney disease with heart failure and stage 1 through stage 4 chronic kidney disease, or unspecified chronic kidney disease: Secondary | ICD-10-CM | POA: Diagnosis not present

## 2016-12-09 DIAGNOSIS — D649 Anemia, unspecified: Secondary | ICD-10-CM | POA: Diagnosis not present

## 2016-12-09 DIAGNOSIS — I2510 Atherosclerotic heart disease of native coronary artery without angina pectoris: Secondary | ICD-10-CM | POA: Diagnosis not present

## 2016-12-11 DIAGNOSIS — Z96642 Presence of left artificial hip joint: Secondary | ICD-10-CM | POA: Diagnosis not present

## 2016-12-11 DIAGNOSIS — Z471 Aftercare following joint replacement surgery: Secondary | ICD-10-CM | POA: Diagnosis not present

## 2016-12-14 DIAGNOSIS — D649 Anemia, unspecified: Secondary | ICD-10-CM | POA: Diagnosis not present

## 2016-12-14 DIAGNOSIS — N189 Chronic kidney disease, unspecified: Secondary | ICD-10-CM | POA: Diagnosis not present

## 2016-12-14 DIAGNOSIS — R779 Abnormality of plasma protein, unspecified: Secondary | ICD-10-CM | POA: Diagnosis not present

## 2016-12-14 DIAGNOSIS — D631 Anemia in chronic kidney disease: Secondary | ICD-10-CM | POA: Diagnosis not present

## 2016-12-15 DIAGNOSIS — S72002D Fracture of unspecified part of neck of left femur, subsequent encounter for closed fracture with routine healing: Secondary | ICD-10-CM | POA: Diagnosis not present

## 2016-12-15 DIAGNOSIS — D631 Anemia in chronic kidney disease: Secondary | ICD-10-CM | POA: Diagnosis not present

## 2016-12-15 DIAGNOSIS — I2510 Atherosclerotic heart disease of native coronary artery without angina pectoris: Secondary | ICD-10-CM | POA: Diagnosis not present

## 2016-12-15 DIAGNOSIS — N184 Chronic kidney disease, stage 4 (severe): Secondary | ICD-10-CM | POA: Diagnosis not present

## 2016-12-20 DIAGNOSIS — I130 Hypertensive heart and chronic kidney disease with heart failure and stage 1 through stage 4 chronic kidney disease, or unspecified chronic kidney disease: Secondary | ICD-10-CM | POA: Diagnosis not present

## 2016-12-20 DIAGNOSIS — M329 Systemic lupus erythematosus, unspecified: Secondary | ICD-10-CM | POA: Diagnosis not present

## 2016-12-20 DIAGNOSIS — D631 Anemia in chronic kidney disease: Secondary | ICD-10-CM | POA: Diagnosis not present

## 2016-12-20 DIAGNOSIS — N184 Chronic kidney disease, stage 4 (severe): Secondary | ICD-10-CM | POA: Diagnosis not present

## 2016-12-26 ENCOUNTER — Other Ambulatory Visit: Payer: Self-pay

## 2016-12-26 NOTE — Patient Outreach (Signed)
[place] [name] Care Management  12/26/2016  Crystal Clarke 24-May-1960 [id number]  Transition of care  Referral date: 12/25/16 Referral source: Transition of care, status post discharge from [facility] on 12/21/16 Insurance: Health team advantage.  Attempt #1  Telephone call to patient for transition of care outreach. Unable to reach patient. HIPAA compliant voice message left with call back phone number.   PLAN:  RNCM will attempt 2nd telephone outreach to patient within 3 business days.   [name] RN,BSN,CCM [facility] Telephonic  [phone]

## 2016-12-27 ENCOUNTER — Other Ambulatory Visit: Payer: Self-pay

## 2016-12-27 DIAGNOSIS — R7301 Impaired fasting glucose: Secondary | ICD-10-CM | POA: Diagnosis not present

## 2016-12-27 DIAGNOSIS — G894 Chronic pain syndrome: Secondary | ICD-10-CM | POA: Diagnosis not present

## 2016-12-27 DIAGNOSIS — M24452 Recurrent dislocation, left hip: Secondary | ICD-10-CM | POA: Diagnosis not present

## 2016-12-27 DIAGNOSIS — I119 Hypertensive heart disease without heart failure: Secondary | ICD-10-CM | POA: Diagnosis not present

## 2016-12-27 DIAGNOSIS — I10 Essential (primary) hypertension: Secondary | ICD-10-CM | POA: Diagnosis not present

## 2016-12-27 DIAGNOSIS — D631 Anemia in chronic kidney disease: Secondary | ICD-10-CM | POA: Diagnosis not present

## 2016-12-27 DIAGNOSIS — N184 Chronic kidney disease, stage 4 (severe): Secondary | ICD-10-CM | POA: Diagnosis not present

## 2016-12-27 NOTE — Patient Outreach (Signed)
[place] [facility]) Care Management  12/27/2016  Jaquisha Frech 02/24/1961 [id number]   Transition of care  Referral date: 12/25/16 Referral source: Transition of care, status post discharge from [facility] on 12/21/16 Insurance: Health team advantage. Patient gave verbal authorization to speak with her ex-husband Michelene Heady regarding all of her personal health information. Ricky's contact phone number if needed is [phone].   PROVIDERS;  Dr. Dirk Dress, Dr. Adria Dill - orthopedic Dr. Lajuan Lines - nephrologist Dr. Bobby Rumpf - oncologist.   This RNCM met with patient, Dr. Dirk Dress, and Jon Billings at patients primary MD visit. Discussed and offered  [facility] care management services with patient. Patient verbally agreed to services.  Patient denies having any other services in the home at this time. Patient states she does not have home health because her insurance would not cover it.  Patient states she has been doing better since she was discharged from [facility] skilled facility recently. Patient states she still has supportive care from her ex-husband, Michelene Heady and her sister checks in with her periodically.  Patient reports she has a follow up visit with her orthopedic doctor on 12/31/16.  Patient states she is also being followed closely by her nephrologist, Dr. Brock Bad due to being in stage IV kidney disease. Patient states she has discussed the potential for dialysis with her doctor.  Patient states she is currently home alone. She states she is managing at home a little better but still needs assistance with light house keeping. Patient states she does not cook for herself because she is unable stand long enough to do so. Patient reports she is also on hip precautions and is unable to bend. Patient states she is still has  Pain in her left knee, back and shoulder.  Patient states she has had 3 orthopedic surgeries within the past 9 months. Patient reports she has  fallen, "all the time." Patient states her appetite is a little better and she drinks ensure as a supplement.  Patient reports she has a lot of medical bills and has to make payment arrangements to manage.  Patient reports she is not taking Eliquis as stated on her discharge medication list from skilled nursing facility.  Patients primary MD wrote patient new prescription for Eliquis at in office visit.  Patient reports being on multiple medications including narcotics for orthopedic pain. Patient denies any problems with constipation at this time. Patient states her pharmacy is [facility] Patient reports having all the medical equipment she needs.   ASSESSMENT:  Per patients [place] health discharge summary:  Status post discharge from [facility] on 12/08/16 with transfer to [facility], post discharge. She was admitted for dizziness and weakness.  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  Her hip has dislocated greater thant 10 times within the past 2-3 months. Prior to this admission she was in [facility] for management by her orthopedic surgeon, Dr. Rachel Moulds. She was offered skilled nursing at that time but refused.  Patient returned home where she lives alone.  Due to being very weak and being unable to perform her ADL's and dizziness she was admitted to [facility] from 12/06/13 to 12/08/16.  Again transferring to [facility] after discharge. She is a high fall risk.  She does not have home health at this time due to her insurance not paying per the patient. She has primary supportive care from her ex-husband  Ricky who checks on her frequently. Patient is on numerous medications including narcotic management for her pain.  Patient will benefit from referral to community case manager, pharmacist and social worker.   PLAN: RNCM will refer to community case manager, Surveyor, mining.    [name] RN,BSN,CCM [facility] Telephonic  [phone]

## 2016-12-28 ENCOUNTER — Other Ambulatory Visit: Payer: Self-pay

## 2016-12-28 NOTE — Patient Outreach (Signed)
Care  Coordination: New referral. Patient was met at MD office and offered services by [name]. Patient accepted.  Home visit was offered to patient on 01/04/2017 and patient accepted.  PLAN: Home visit for assessment of needs of 01/04/2017.  Crystal Rand, RN, BSN, CEN [facility] [employer] [phone]

## 2017-01-03 ENCOUNTER — Other Ambulatory Visit: Payer: Self-pay | Admitting: Pharmacist

## 2017-01-03 DIAGNOSIS — N183 Chronic kidney disease, stage 3 (moderate): Secondary | ICD-10-CM | POA: Diagnosis not present

## 2017-01-03 DIAGNOSIS — I10 Essential (primary) hypertension: Secondary | ICD-10-CM | POA: Diagnosis not present

## 2017-01-03 NOTE — Patient Outreach (Signed)
[facility]) Care Management  [place]   01/03/2017  Crystal Clarke March 25, 1960 [id number]  Subjective:  Patient was referred to [facility] Pharmacist by [facility] RN [place] for medication review.  Of note, patient was discharged from [facility] 12/21/16 per 12/27/16 [facility] RN note.    Patient reports she fills her own pill boxes.  She denies difficulty filling her pill boxes.  She denies medication affordability needs.  Discussed with patient [facility] Pharmacist would like to review her medications with her and home visit was offered, but she states she can review her bottles over the phone instead.  She reports she was naming off bottles and doses she takes during phone call.    Objective:   Current Medications: No current outpatient prescriptions on file.   No current facility-administered medications for this visit.     Functional Status: No flowsheet data found.  Fall/Depression Screening: Fall Risk  12/28/2016  Falls in the past year? Yes  Number falls in past yr: 2 or more  Injury with Fall? Yes  Risk Factor Category  High Fall Risk  Risk for fall due to : History of fall(s)  Follow up Follow up appointment  Comment patients primary MD appointment attended by [facility]. Primary MD discussed falls with patient.    PHQ 2/9 Scores 12/28/2016  PHQ - 2 Score 3  PHQ- 9 Score 10    Assessment:  Medication review per patient report.  Also reviewed Discharge medication list scanned in chart from [facility] SNF dated 12/21/16.  Patient was recently discharged from hospital and all medications have been reviewed.   Drugs sorted by system:  Neurologic/Psychologic: -quetiapine ER 150 mg at bedtime  -fluoxetine 40 mg in am -alprazolam 0.25 mg twice daily as needed -zolpidem 10 mg at bedtime  Cardiovascular: -carvedilol 6.25 mg twice daily if SBP >100 mmHg -aspirin 81 mg daily -ezetimibe 10 mg daily -isosorbide mononitrate 30 mg twice daily -apixaban 2.5 mg twice daily  ---through 01/05/17 per SNF discharge list  Gastrointestinal: -ranitidine 150 mg daily  -dexlansoprazole 60 mg daily  Endocrine: -levothyroxine 75 mcg daily  Pain: -oxycodone ER 30 mg twice daily -oxycodone IR 10 mg every 6 hr as needed pain---reports she usually takes twice daily -diclofenac gel   Vitamins/Minerals: -vitamin D 1,000 units daily -senna/docusate daily  -ferrouse sulfate 325 mg twice daily   Miscellaneous: -Procrit---reports she self-injects every other week    Drug interactions:  -Patient is on multiple medications that increase risk of CNS depression including narcotics, alprazolam, quetiapine, zolpidem.  Suggest evaluating patient for risk of over-sedation/CNS adverse effects.    Other issues noted:   Medication discrepancies with patient report and SNF discharge report:   -zolpidem:  SNF discharge report shows zolpidem 5 mg daily---patient reports 10 mg daily---please verify dose and if therapeutically appropriate consider reduced dose due to other CNS depressant medications patient is on.   -alprazolam:  SNF discharge report shows alprazolam 0.5 mg twice daily, patient reports 0.25 mg twice daily---please clarify dose  -isosorbide mononitrate:  SNF discharge report shows isosorbide mononitrate 30 mg daily, patient reports twice daily  -ferrous sulfate:  SNF discharge report shows 325 mg once daily, patient reports twice daily---please verify dose  -atorvastatin:  Included on SNF discharge report, patient did not report taking  -losartan:  Not on SNF discharge report, patient reports not taking as she isn't sure if she is to take or note---suggest verifying if patient to take given renal dysfunction  Plan:  Will route note to PCP.   Will attempt to get medication list from PCP office as well given discrepancies between patient report and SNF medication list.    [name], PharmD, [place] [phone]

## 2017-01-04 ENCOUNTER — Other Ambulatory Visit: Payer: Self-pay

## 2017-01-04 ENCOUNTER — Other Ambulatory Visit: Payer: Self-pay | Admitting: *Deleted

## 2017-01-04 NOTE — Patient Outreach (Signed)
[facility]) Care Management   01/04/2017  Crystal Clarke 1960-05-21 [id number]  Crystal Clarke is an 56 y.o. female 27 arrived for home visit.  Difficulty finding correct apartment. Reached patient by intercom and she opened the door.  Subjective: Patient reports she is feeling better and stronger. Reports difficulty managing with meals and housekeeping.  Reports that she is not interested in mobile meals.  Reports she is also struggling with medical bills.  Reports 15 plus trips to the emergency department this year for falls, orthopedic issues and renal problems.  Patient reports that she has been having problems with her phone.  Patient reports that her walking is slowly getting better with her walker.  Reports good support in the apartments she lives in and with her ex husband. Reports that she is managing her medications without problems. Self monitors BP twice a day.  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 Was discharged from rehab on 10/4.  Objective:  Ambulating with a walker and elevated right shoe. Awake and alert, pleasant.  Dog--Molly Vitals:   01/04/17 1240  BP: (!) 104/56  Pulse: 72  Resp: 16  SpO2: 98%  Weight: 125 lb (56.7 kg)  Height: 1.651 m (5\' 5" )   Review of Systems  Constitutional: Positive for malaise/fatigue and weight loss.  HENT: Positive for tinnitus.   Eyes:       Wears glasses  Respiratory: Negative.   Cardiovascular: Negative.   Gastrointestinal: Positive for constipation.  Genitourinary: Positive for frequency.  Musculoskeletal: Positive for falls and joint pain.  Skin: Negative.   Neurological: Negative.   Endo/Heme/Allergies: Negative.   Psychiatric/Behavioral: The patient has insomnia.     Physical Exam  Constitutional: She is oriented to person, place, and time. She appears well-developed and well-nourished.  Cardiovascular: Normal rate, normal heart sounds and intact distal pulses.   Respiratory: Effort  normal and breath sounds normal.  GI: Soft. Bowel sounds are normal.  Musculoskeletal: She exhibits deformity. She exhibits no edema.  Right leg shorter than left. Wears elevated shoe.   Neurological: She is alert and oriented to person, place, and time.  Skin: Skin is warm and dry.  Feet not inspected due to patient not wanting to remove special shoe.  Psychiatric: She has a normal mood and affect. Her behavior is normal. Judgment and thought content normal.    Encounter Medications:   Outpatient Encounter Prescriptions as of 01/04/2017  Medication Sig  . ALPRAZolam (XANAX) 0.5 MG tablet Take 0.5 mg by mouth 2 (two) times daily as needed.  Marland Kitchen apixaban (ELIQUIS) 2.5 MG TABS tablet Take 2.5 mg by mouth 2 (two) times daily.  Marland Kitchen aspirin 81 MG chewable tablet Chew by mouth daily.  . carvedilol (COREG) 25 MG tablet Take 25 mg by mouth 2 (two) times daily with a meal. For BP greater than 100.  Marland Kitchen dexlansoprazole (DEXILANT) 60 MG capsule Take 60 mg by mouth daily.  . diclofenac sodium (VOLTAREN) 1 % GEL Apply topically 4 (four) times daily.  Marland Kitchen epoetin alfa (EPOGEN,PROCRIT) 14970 UNIT/ML injection Inject 20,000 Units into the skin. Every other week  . ergocalciferol (VITAMIN D2) 50000 units capsule Take 50,000 Units by mouth once a week.  . ezetimibe (ZETIA) 10 MG tablet Take 10 mg by mouth daily.  . ferrous sulfate 325 (65 FE) MG tablet Take 325 mg by mouth daily with breakfast.  . FLUoxetine (PROZAC) 20 MG tablet Take 20 mg by mouth daily.  . isosorbide mononitrate (IMDUR) 30  MG 24 hr tablet Take 30 mg by mouth 2 (two) times daily.  Marland Kitchen levothyroxine (SYNTHROID, LEVOTHROID) 75 MCG tablet Take 75 mcg by mouth daily before breakfast.  . nitroGLYCERIN (NITROSTAT) 0.4 MG SL tablet Place 0.4 mg under the tongue every 5 (five) minutes as needed.  Marland Kitchen oxyCODONE 30 MG 12 hr tablet Take 1 tablet by mouth 2 (two) times daily.  . Oxycodone HCl 10 MG TABS Take 1 tablet by mouth 2 (two) times daily as needed.  Marland Kitchen  QUEtiapine Fumarate (SEROQUEL XR) 150 MG 24 hr tablet Take 150 mg by mouth at bedtime.  . ranitidine (ZANTAC) 150 MG capsule Take 150 mg by mouth at bedtime.  [name] (SENNA PLUS PO) Take 2 tablets by mouth 2 (two) times daily.  . simvastatin (ZOCOR) 40 MG tablet Take 40 mg by mouth at bedtime.  Marland Kitchen zolpidem (AMBIEN) 10 MG tablet Take 10 mg by mouth at bedtime as needed.  . [DISCONTINUED] oxyCODONE (OXYCONTIN) 20 mg 12 hr tablet Take 20 mg by mouth every 12 (twelve) hours.   No facility-administered encounter medications on file as of 01/04/2017.     Functional Status:   In your present state of health, do you have any difficulty performing the following activities: 01/04/2017  Hearing? N  Vision? N  Difficulty concentrating or making decisions? N  Walking or climbing stairs? Y  Dressing or bathing? N  Doing errands, shopping? N  Preparing Food and eating ? Y  Using the Toilet? Y  In the past six months, have you accidently leaked urine? Y  Do you have problems with loss of bowel control? N  Managing your Medications? N  Managing your Finances? N  Housekeeping or managing your Housekeeping? Y  Some recent data might be hidden    Fall/Depression Screening:    Fall Risk  01/04/2017 12/28/2016  Falls in the past year? Yes Yes  Number falls in past yr: 2 or more 2 or more  Injury with Fall? Yes Yes  Risk Factor Category  High Fall Risk High Fall Risk  Risk for fall due to : Impaired mobility History of fall(s)  Follow up Falls prevention discussed Follow up appointment  Comment - patients primary MD appointment attended by [facility]. Primary MD discussed falls with patient.    PHQ 2/9 Scores 01/04/2017 12/28/2016  PHQ - 2 Score 2 3  PHQ- 9 Score 3 10    Assessment:   (1) reviewed [facility] program. Provided new patient packet. Provided [facility] calendar and my contact card. Reviewed consent and patient completed consent. Reviewed transition of care program.  (2) reports 20  pound weight loss. (3) fall risk from many previous falls. (4) reports needing assistance with meals, medical bills and housekeeping.   Plan:  (1) consent scanned into medical record.  Review the need for patient to answer her phone so we can communicate. Reviewed with patient that I would call her in 1 week.   (2) Encouraged patient to eat well and drink supplements. (3) reviewed fall precautions and importance of patient using walker at all times. Encouraged patient to continue to observe hip precautions and work on home exercises. (4) telephone update provided to [place] social worker.  Care planning and goal setting and patients primary goal is to avoid returning to the hospital.  This note and barrier letter sent to MD.  [facility] CM Care Plan Problem One     Most Recent Value  Care Plan Problem One  Recent discharge  from rehab after hip surgery  Role Documenting the Problem One  Care Management [place] for Problem One  Active  [facility] Long Term Goal   Patient will report no readmission to the hospital for hip issues in the next 60 days.   [facility]N Long Term Goal Start Date  01/04/17  Interventions for Problem One Long Term Goal  Reviewed [facility] program and transition of care.  Provided my contact card. Encouraged patient to answer her phone. home visit completed.   THN CM Short Term Goal #1   Patient will report no falls in the next 30 days.   THN CM Short Term Goal #1 Start Date  01/04/17  Interventions for Short Term Goal #1  Reviewed fall precautions. reviewed wearing shoes. reviewed not using rugs and wearing non slip socks.  Also reviewed caution with pets.  THN CM Short Term Goal #2   Patient will report taking all medications as prescribed in the next 30 days.   THN CM Short Term Goal #2 Start Date  01/04/17  Interventions for Short Term Goal #2  reviewed importance of taking all medications as prescribed. ensured patient has all her medications as prescribed.      Tomasa Rand, RN, BSN, CEN [facility] [employer] [phone]

## 2017-01-04 NOTE — Patient Outreach (Signed)
[facility]) Care Management  01/04/2017  Astha Probasco 01-16-1961 [id number]  CSW received referral for assessment from [facility] team. CSW made an initial attempt to try and contact patient today to perform phone assessment, as well as assess and assist with social needs and services, without success.  A HIPAA compliant message was left for patient on voicemail. CSW will try again tomorrow if no return call is received.    [name], MSW, [place] Worker  [place] [phone]

## 2017-01-05 ENCOUNTER — Ambulatory Visit: Payer: Self-pay | Admitting: *Deleted

## 2017-01-05 ENCOUNTER — Other Ambulatory Visit: Payer: Self-pay | Admitting: *Deleted

## 2017-01-05 NOTE — Patient Outreach (Signed)
[facility]) Care Management  01/05/2017  Crystal Clarke 12/07/1960 [id number]   CSW made a 2nd nitial attempt to try and contact patient today to perform phone assessment, as well as assess and assist with social needs and services, without success.  A HIPAA compliant message was left for patient on voicemail. CSW will try again in 3-5 days if no return call is received.   [name], MSW, [place] Worker  [place] [phone]

## 2017-01-08 ENCOUNTER — Other Ambulatory Visit: Payer: Self-pay | Admitting: Pharmacist

## 2017-01-08 NOTE — Patient Outreach (Signed)
[place] [facility]) Care Management  01/08/2017  Crystal Clarke 1960-09-20 [id number]  Successful phone outreach to PCP, Dr Olivia Canter office, spoke with Hoyle Sauer.  Hoyle Sauer states patient was seen by MD 12/27/16 and they have copy of [facility] Pharmacist note with medication discrepancies patient reported versus SNF discharge medication list.    Hoyle Sauer offered to fax copy of PCP medication list following PCP appointment from 12/27/16.   Provided [facility] fax number.   Plan:  Will review medication list from PCP and call patient as needed.    [name], PharmD, [place] [phone]

## 2017-01-09 ENCOUNTER — Other Ambulatory Visit: Payer: Self-pay | Admitting: *Deleted

## 2017-01-10 NOTE — Patient Outreach (Signed)
[place] [facility]) Care Management  [facility] Social Work  01/09/2017  Crystal Clarke 30-May-1960 [id number]  Subjective:  "I have had hip and knee surgeries and physically limited".  Objective: CSW to assist patient with commuity based resources to aide in her well-being, quality of life and overall safety/needs.    Current Medications:  Current Outpatient Prescriptions  Medication Sig Dispense Refill  . ALPRAZolam (XANAX) 0.5 MG tablet Take 0.5 mg by mouth 2 (two) times daily as needed.    Marland Kitchen apixaban (ELIQUIS) 2.5 MG TABS tablet Take 2.5 mg by mouth 2 (two) times daily.    Marland Kitchen aspirin 81 MG chewable tablet Chew 81 mg by mouth daily.     . carvedilol (COREG) 6.25 MG tablet Take 6.25 mg by mouth 2 (two) times daily with a meal. For BP greater than 100.    . cholecalciferol (VITAMIN D) 1000 units tablet Take 1,000 Units by mouth daily.    Marland Kitchen dexlansoprazole (DEXILANT) 60 MG capsule Take 60 mg by mouth daily.    . diclofenac sodium (VOLTAREN) 1 % GEL Apply topically 4 (four) times daily.    Marland Kitchen epoetin alfa (EPOGEN,PROCRIT) 19147 UNIT/ML injection Inject 20,000 Units into the skin. Every other week    . ezetimibe (ZETIA) 10 MG tablet Take 10 mg by mouth daily.    . ferrous sulfate 325 (65 FE) MG tablet Take 325 mg by mouth 2 (two) times daily with a meal.     . FLUoxetine (PROZAC) 40 MG capsule Take 40 mg by mouth daily.     . isosorbide mononitrate (IMDUR) 30 MG 24 hr tablet Take 30 mg by mouth 2 (two) times daily.    Marland Kitchen levothyroxine (SYNTHROID, LEVOTHROID) 75 MCG tablet Take 75 mcg by mouth daily before breakfast.    . nitroGLYCERIN (NITROSTAT) 0.4 MG SL tablet Place 0.4 mg under the tongue every 5 (five) minutes as needed.    Marland Kitchen oxyCODONE 30 MG 12 hr tablet Take 1 tablet by mouth 2 (two) times daily.    . Oxycodone HCl 10 MG TABS Take 1 tablet by mouth 2 (two) times daily as needed.    Marland Kitchen QUEtiapine Fumarate (SEROQUEL XR) 150 MG 24 hr tablet Take 150 mg by mouth at bedtime.    .  ranitidine (ZANTAC) 150 MG capsule Take 150 mg by mouth at bedtime.    [name] (SENNA PLUS PO) Take 2 tablets by mouth 2 (two) times daily.    . simvastatin (ZOCOR) 40 MG tablet Take 40 mg by mouth at bedtime.    Marland Kitchen zolpidem (AMBIEN) 10 MG tablet Take 10 mg by mouth at bedtime as needed.     No current facility-administered medications for this visit.     Functional Status:  In your present state of health, do you have any difficulty performing the following activities: 01/04/2017  Hearing? N  Vision? N  Difficulty concentrating or making decisions? N  Walking or climbing stairs? Y  Dressing or bathing? N  Doing errands, shopping? N  Preparing Food and eating ? Y  Using the Toilet? Y  In the past six months, have you accidently leaked urine? Y  Do you have problems with loss of bowel control? N  Managing your Medications? N  Managing your Finances? N  Housekeeping or managing your Housekeeping? Y  Some recent data might be hidden    Fall/Depression Screening:  Fall Risk  01/04/2017 12/28/2016  Falls in the past year? Yes Yes  Number falls in past  yr: 2 or more 2 or more  Injury with Fall? Yes Yes  Risk Factor Category  High Fall Risk High Fall Risk  Risk for fall due to : Impaired mobility History of fall(s)  Follow up Falls prevention discussed Follow up appointment  Comment - patients primary MD appointment attended by [facility]. Primary MD discussed falls with patient.    PHQ 2/9 Scores 01/04/2017 12/28/2016  PHQ - 2 Score 2 3  PHQ- 9 Score 3 10    Assessment:  CSW was able to make initial contact with patient to perform phone assessment, as well as assess and assist with social work needs and services.  CSW obtained two HIPAA compliant identifiers from patient, which included patient's name and date of birth.   CSW introduced self, explained role and types of services provided through [place] Management ([place] Management).  CSW  further explained to patient that CSW works with patient's RNCM, also with [place] Management, . CSW then explained the reason for the call, indicating that patient's RNCM thought that patient would benefit from social work services and resources to assist with housekeeping, finances, etc.    Patient reports she lives alone, is on disability and receives partial Medicaid.  She reports having "15 ambulance bills" and about $20,000 of outstanding medical bills. CSW has encouraged patient to collect all of her outstanding medical bills and deliver to [place] office for determination of full Medicaid eligibilty. Encouraged her to collect and deliver as soon as possible for retro purposes. Patient is in need of home support to help with personal care and housekeeping- patient advised of [facility] program and will make referral for evaluation/determination for in home personal care and housekeeping assistance. She is not eligible for meals on wheels due to her age (55 and above) but CSW will send her food bank/pantry resources to utilize. She does report getting $16 month of food stamps.  Patient is positive and in good spirits; she is appreciative of assistance and agrees to plans.  Plan:  [facility] CM Care Plan Problem One     Most Recent Value  Care Plan Problem One  Patient with physical and financial limitations.  Role Documenting the Problem One  Clinical Social Worker  Care Plan for Problem One  Active  [facility] CM Short Term Goal #1   Patient will be linked with services/resources to aide in her needs in the next 30 days.  [facility] CM Short Term Goal #1 Start Date  01/09/17  Interventions for Short Term Goal #1  CSW assiting with referrals and  linking patient with community resources to aide in her needs.   [facility] CM Short Term Goal #2   Patient will report follow up with DSS for ?full Medicaid  d/t medical bills in the next 30 days.  [facility] CM Short Term Goal #2 Start Date  01/09/17  Interventions for Short Term Goal #2  CSW  educated and encouraged patient to collect and deliver medical bills to DSS for review and determintation of eligibility for full Medicaid.      [name], MSW, [place] Worker  [place] [phone]

## 2017-01-10 NOTE — Patient Outreach (Signed)
Request received from Janet Caldwell, LCSW to mail patient personal care resources.  Information mailed today. 

## 2017-01-11 ENCOUNTER — Other Ambulatory Visit: Payer: Self-pay

## 2017-01-11 DIAGNOSIS — N184 Chronic kidney disease, stage 4 (severe): Secondary | ICD-10-CM | POA: Diagnosis not present

## 2017-01-11 DIAGNOSIS — I10 Essential (primary) hypertension: Secondary | ICD-10-CM | POA: Diagnosis not present

## 2017-01-11 NOTE — Patient Outreach (Signed)
Transition of care call:  Placed call to patient who reports that she is doing okay. Reports pain in both legs.  States that she will have labs and ultrasound this week. Reports she is doing well with her medications.  Denies any new concerns today.  PLAN:Will continue weekly transition of care calls. Encouraged patient to call me sooner if needed.  Tomasa Rand, RN, BSN, CEN [facility] [employer] [phone]

## 2017-01-12 DIAGNOSIS — N184 Chronic kidney disease, stage 4 (severe): Secondary | ICD-10-CM | POA: Diagnosis not present

## 2017-01-12 DIAGNOSIS — N281 Cyst of kidney, acquired: Secondary | ICD-10-CM | POA: Diagnosis not present

## 2017-01-18 ENCOUNTER — Other Ambulatory Visit: Payer: Self-pay

## 2017-01-18 NOTE — Patient Outreach (Addendum)
Transition of care:  Placed call to patient. Left a message requesting a call back.  Incoming call from patient who reports that she is doing ok. Reports that she had labs and ultrasound last week. Reports kidney function is 12%. States that MD has discussed dialysis and she will start attending some pre dialysis classes.   Patient reports that she continues to have hip pain but is managing.  Patient reports 6 pound weight loss.  States that she is going to start ensure today.  Confirms that she has ensure at her home.  PLAN: will continue weekly transition of care calls.  Encouraged patient to call MD for any changes in condition.  Tomasa Rand, RN, BSN, CEN [facility] [employer] [phone]

## 2017-01-19 DIAGNOSIS — Z96652 Presence of left artificial knee joint: Secondary | ICD-10-CM | POA: Diagnosis not present

## 2017-01-19 DIAGNOSIS — T8454XD Infection and inflammatory reaction due to internal left knee prosthesis, subsequent encounter: Secondary | ICD-10-CM | POA: Diagnosis not present

## 2017-01-23 ENCOUNTER — Other Ambulatory Visit: Payer: Self-pay | Admitting: *Deleted

## 2017-01-25 ENCOUNTER — Other Ambulatory Visit: Payer: Self-pay

## 2017-01-25 DIAGNOSIS — N183 Chronic kidney disease, stage 3 (moderate): Secondary | ICD-10-CM | POA: Diagnosis not present

## 2017-01-25 DIAGNOSIS — I10 Essential (primary) hypertension: Secondary | ICD-10-CM | POA: Diagnosis not present

## 2017-01-25 NOTE — Patient Outreach (Signed)
Transition of care: Placed 2 calls to patient today without an answer. Left messages requesting a call back.  PLAN: Will continue weekly transition of care call and await a call back.  Tomasa Rand, RN, BSN, CEN [facility] [employer] [phone]

## 2017-01-27 NOTE — Patient Outreach (Signed)
[place] [facility]) Care Management  01/27/2017  Crystal Clarke 22-Oct-1960 [id number]   CSW spoke with patient whos reports she has not reapplied for Medicaid yet- she is encouraged to do so before the end of the year along with outstanding medical bills. CSW provided patient with detailed info about the [employer] program. "That sounds great". Advised her of her probable "copay"/patient liability cost which should be a minimal charge given her income. She plans to consider and discuss further with [employer] rep.    [name], MSW, [place] Worker  [place] [phone]

## 2017-01-29 DIAGNOSIS — R109 Unspecified abdominal pain: Secondary | ICD-10-CM | POA: Diagnosis not present

## 2017-01-29 DIAGNOSIS — N184 Chronic kidney disease, stage 4 (severe): Secondary | ICD-10-CM | POA: Diagnosis not present

## 2017-01-29 DIAGNOSIS — Z8673 Personal history of transient ischemic attack (TIA), and cerebral infarction without residual deficits: Secondary | ICD-10-CM | POA: Diagnosis not present

## 2017-01-29 DIAGNOSIS — I252 Old myocardial infarction: Secondary | ICD-10-CM | POA: Diagnosis not present

## 2017-01-29 DIAGNOSIS — Z87891 Personal history of nicotine dependence: Secondary | ICD-10-CM | POA: Diagnosis not present

## 2017-01-29 DIAGNOSIS — Z951 Presence of aortocoronary bypass graft: Secondary | ICD-10-CM | POA: Diagnosis not present

## 2017-01-29 DIAGNOSIS — Z85528 Personal history of other malignant neoplasm of kidney: Secondary | ICD-10-CM | POA: Diagnosis not present

## 2017-01-29 DIAGNOSIS — Z881 Allergy status to other antibiotic agents status: Secondary | ICD-10-CM | POA: Diagnosis not present

## 2017-01-29 DIAGNOSIS — I509 Heart failure, unspecified: Secondary | ICD-10-CM | POA: Diagnosis not present

## 2017-01-29 DIAGNOSIS — I130 Hypertensive heart and chronic kidney disease with heart failure and stage 1 through stage 4 chronic kidney disease, or unspecified chronic kidney disease: Secondary | ICD-10-CM | POA: Diagnosis not present

## 2017-01-29 DIAGNOSIS — R531 Weakness: Secondary | ICD-10-CM | POA: Diagnosis not present

## 2017-01-29 DIAGNOSIS — I129 Hypertensive chronic kidney disease with stage 1 through stage 4 chronic kidney disease, or unspecified chronic kidney disease: Secondary | ICD-10-CM | POA: Diagnosis not present

## 2017-01-29 DIAGNOSIS — I2510 Atherosclerotic heart disease of native coronary artery without angina pectoris: Secondary | ICD-10-CM | POA: Diagnosis not present

## 2017-01-29 DIAGNOSIS — R079 Chest pain, unspecified: Secondary | ICD-10-CM | POA: Diagnosis not present

## 2017-01-29 DIAGNOSIS — M3214 Glomerular disease in systemic lupus erythematosus: Secondary | ICD-10-CM | POA: Diagnosis not present

## 2017-01-29 DIAGNOSIS — R5383 Other fatigue: Secondary | ICD-10-CM | POA: Diagnosis not present

## 2017-01-29 DIAGNOSIS — Z882 Allergy status to sulfonamides status: Secondary | ICD-10-CM | POA: Diagnosis not present

## 2017-01-30 DIAGNOSIS — R079 Chest pain, unspecified: Secondary | ICD-10-CM | POA: Diagnosis not present

## 2017-01-31 ENCOUNTER — Ambulatory Visit: Payer: Self-pay | Admitting: *Deleted

## 2017-02-01 ENCOUNTER — Other Ambulatory Visit: Payer: Self-pay | Admitting: *Deleted

## 2017-02-01 ENCOUNTER — Other Ambulatory Visit: Payer: Self-pay

## 2017-02-01 NOTE — Patient Outreach (Signed)
Transition of care: Placed call to patient for transition of care. No answer. Left a message requesting a call back.  Amanda Cook, RN, BSN, CEN [facility] Community Care Coordinator [phone] 

## 2017-02-06 ENCOUNTER — Other Ambulatory Visit: Payer: Self-pay

## 2017-02-06 NOTE — Patient Outreach (Signed)
Transition of care:  Placed call to patient. No answer. Left a message requesting a call back.  PLAN: will await a call back.  Tomasa Rand, RN, BSN, CEN [facility] [employer] [phone]

## 2017-02-09 DIAGNOSIS — M25551 Pain in right hip: Secondary | ICD-10-CM | POA: Diagnosis not present

## 2017-02-09 DIAGNOSIS — T8189XA Other complications of procedures, not elsewhere classified, initial encounter: Secondary | ICD-10-CM | POA: Diagnosis not present

## 2017-02-09 DIAGNOSIS — N289 Disorder of kidney and ureter, unspecified: Secondary | ICD-10-CM | POA: Diagnosis not present

## 2017-02-09 DIAGNOSIS — D62 Acute posthemorrhagic anemia: Secondary | ICD-10-CM | POA: Diagnosis not present

## 2017-02-09 DIAGNOSIS — S7011XA Contusion of right thigh, initial encounter: Secondary | ICD-10-CM | POA: Diagnosis not present

## 2017-02-09 DIAGNOSIS — D500 Iron deficiency anemia secondary to blood loss (chronic): Secondary | ICD-10-CM | POA: Diagnosis not present

## 2017-02-09 DIAGNOSIS — M79604 Pain in right leg: Secondary | ICD-10-CM | POA: Diagnosis not present

## 2017-02-09 DIAGNOSIS — T8131XA Disruption of external operation (surgical) wound, not elsewhere classified, initial encounter: Secondary | ICD-10-CM | POA: Diagnosis not present

## 2017-02-10 DIAGNOSIS — I129 Hypertensive chronic kidney disease with stage 1 through stage 4 chronic kidney disease, or unspecified chronic kidney disease: Secondary | ICD-10-CM | POA: Diagnosis not present

## 2017-02-10 DIAGNOSIS — Z452 Encounter for adjustment and management of vascular access device: Secondary | ICD-10-CM | POA: Diagnosis not present

## 2017-02-10 DIAGNOSIS — I509 Heart failure, unspecified: Secondary | ICD-10-CM | POA: Diagnosis not present

## 2017-02-10 DIAGNOSIS — I130 Hypertensive heart and chronic kidney disease with heart failure and stage 1 through stage 4 chronic kidney disease, or unspecified chronic kidney disease: Secondary | ICD-10-CM | POA: Diagnosis not present

## 2017-02-10 DIAGNOSIS — F329 Major depressive disorder, single episode, unspecified: Secondary | ICD-10-CM | POA: Diagnosis not present

## 2017-02-10 DIAGNOSIS — Z85528 Personal history of other malignant neoplasm of kidney: Secondary | ICD-10-CM | POA: Diagnosis not present

## 2017-02-10 DIAGNOSIS — I999 Unspecified disorder of circulatory system: Secondary | ICD-10-CM | POA: Diagnosis not present

## 2017-02-10 DIAGNOSIS — M25451 Effusion, right hip: Secondary | ICD-10-CM | POA: Diagnosis not present

## 2017-02-10 DIAGNOSIS — Z96653 Presence of artificial knee joint, bilateral: Secondary | ICD-10-CM | POA: Diagnosis not present

## 2017-02-10 DIAGNOSIS — D631 Anemia in chronic kidney disease: Secondary | ICD-10-CM | POA: Diagnosis not present

## 2017-02-10 DIAGNOSIS — M25551 Pain in right hip: Secondary | ICD-10-CM | POA: Diagnosis not present

## 2017-02-10 DIAGNOSIS — I1310 Hypertensive heart and chronic kidney disease without heart failure, with stage 1 through stage 4 chronic kidney disease, or unspecified chronic kidney disease: Secondary | ICD-10-CM | POA: Diagnosis not present

## 2017-02-10 DIAGNOSIS — Z87891 Personal history of nicotine dependence: Secondary | ICD-10-CM | POA: Diagnosis not present

## 2017-02-10 DIAGNOSIS — M7981 Nontraumatic hematoma of soft tissue: Secondary | ICD-10-CM | POA: Diagnosis not present

## 2017-02-10 DIAGNOSIS — I2510 Atherosclerotic heart disease of native coronary artery without angina pectoris: Secondary | ICD-10-CM | POA: Diagnosis not present

## 2017-02-10 DIAGNOSIS — I252 Old myocardial infarction: Secondary | ICD-10-CM | POA: Diagnosis not present

## 2017-02-10 DIAGNOSIS — K219 Gastro-esophageal reflux disease without esophagitis: Secondary | ICD-10-CM | POA: Diagnosis not present

## 2017-02-10 DIAGNOSIS — I9589 Other hypotension: Secondary | ICD-10-CM | POA: Diagnosis not present

## 2017-02-10 DIAGNOSIS — E039 Hypothyroidism, unspecified: Secondary | ICD-10-CM | POA: Diagnosis not present

## 2017-02-10 DIAGNOSIS — M879 Osteonecrosis, unspecified: Secondary | ICD-10-CM | POA: Diagnosis not present

## 2017-02-10 DIAGNOSIS — S7011XA Contusion of right thigh, initial encounter: Secondary | ICD-10-CM | POA: Diagnosis not present

## 2017-02-10 DIAGNOSIS — F419 Anxiety disorder, unspecified: Secondary | ICD-10-CM | POA: Diagnosis not present

## 2017-02-10 DIAGNOSIS — N184 Chronic kidney disease, stage 4 (severe): Secondary | ICD-10-CM | POA: Diagnosis not present

## 2017-02-10 DIAGNOSIS — M3214 Glomerular disease in systemic lupus erythematosus: Secondary | ICD-10-CM | POA: Diagnosis not present

## 2017-02-10 DIAGNOSIS — Z7982 Long term (current) use of aspirin: Secondary | ICD-10-CM | POA: Diagnosis not present

## 2017-02-10 DIAGNOSIS — D649 Anemia, unspecified: Secondary | ICD-10-CM | POA: Diagnosis not present

## 2017-02-10 DIAGNOSIS — Z8673 Personal history of transient ischemic attack (TIA), and cerebral infarction without residual deficits: Secondary | ICD-10-CM | POA: Diagnosis not present

## 2017-02-10 DIAGNOSIS — E440 Moderate protein-calorie malnutrition: Secondary | ICD-10-CM | POA: Diagnosis not present

## 2017-02-10 DIAGNOSIS — I5020 Unspecified systolic (congestive) heart failure: Secondary | ICD-10-CM | POA: Diagnosis not present

## 2017-02-10 DIAGNOSIS — S79911A Unspecified injury of right hip, initial encounter: Secondary | ICD-10-CM | POA: Diagnosis not present

## 2017-02-14 ENCOUNTER — Other Ambulatory Visit: Payer: Self-pay | Admitting: Pharmacist

## 2017-02-14 ENCOUNTER — Other Ambulatory Visit: Payer: Self-pay | Admitting: *Deleted

## 2017-02-14 ENCOUNTER — Ambulatory Visit: Payer: Self-pay | Admitting: *Deleted

## 2017-02-14 MED ORDER — HYDRALAZINE HCL 20 MG/ML IJ SOLN
10.00 mg | INTRAMUSCULAR | Status: DC
Start: ? — End: 2017-02-14

## 2017-02-14 MED ORDER — OXYCODONE HCL 5 MG PO TABS
5.00 mg | ORAL_TABLET | ORAL | Status: DC
Start: ? — End: 2017-02-14

## 2017-02-14 MED ORDER — ONDANSETRON HCL 4 MG/2ML IJ SOLN
4.00 mg | INTRAMUSCULAR | Status: DC
Start: ? — End: 2017-02-14

## 2017-02-14 MED ORDER — ALPRAZOLAM 0.5 MG PO TABS
0.50 mg | ORAL_TABLET | ORAL | Status: DC
Start: 2017-02-14 — End: 2017-02-14

## 2017-02-14 MED ORDER — ACETAMINOPHEN 325 MG PO TABS
650.00 mg | ORAL_TABLET | ORAL | Status: DC
Start: ? — End: 2017-02-14

## 2017-02-14 MED ORDER — FAMOTIDINE 20 MG PO TABS
20.00 mg | ORAL_TABLET | ORAL | Status: DC
Start: 2017-02-14 — End: 2017-02-14

## 2017-02-14 MED ORDER — FLUOXETINE HCL 20 MG PO CAPS
40.00 mg | ORAL_CAPSULE | ORAL | Status: DC
Start: 2017-02-15 — End: 2017-02-14

## 2017-02-14 MED ORDER — GUAIFENESIN-DM 100-10 MG/5ML PO SYRP
5.00 mL | ORAL_SOLUTION | ORAL | Status: DC
Start: ? — End: 2017-02-14

## 2017-02-14 MED ORDER — ATORVASTATIN CALCIUM 10 MG PO TABS
20.00 mg | ORAL_TABLET | ORAL | Status: DC
Start: 2017-02-14 — End: 2017-02-14

## 2017-02-14 MED ORDER — GENERIC EXTERNAL MEDICATION
300.00 | Status: DC
Start: 2017-02-14 — End: 2017-02-14

## 2017-02-14 MED ORDER — QUETIAPINE FUMARATE ER 50 MG PO TB24
150.00 mg | ORAL_TABLET | ORAL | Status: DC
Start: 2017-02-14 — End: 2017-02-14

## 2017-02-14 MED ORDER — DOCUSATE SODIUM 100 MG PO CAPS
100.00 mg | ORAL_CAPSULE | ORAL | Status: DC
Start: ? — End: 2017-02-14

## 2017-02-14 MED ORDER — GENERIC EXTERNAL MEDICATION
325.00 mg | Status: DC
Start: 2017-02-15 — End: 2017-02-14

## 2017-02-14 MED ORDER — ZOLPIDEM TARTRATE 5 MG PO TABS
10.00 mg | ORAL_TABLET | ORAL | Status: DC
Start: ? — End: 2017-02-14

## 2017-02-14 MED ORDER — EPOETIN ALFA 20000 UNIT/ML IJ SOLN
20000.00 | INTRAMUSCULAR | Status: DC
Start: 2017-02-24 — End: 2017-02-14

## 2017-02-14 MED ORDER — ONDANSETRON 4 MG PO TBDP
4.00 mg | ORAL_TABLET | ORAL | Status: DC
Start: ? — End: 2017-02-14

## 2017-02-14 MED ORDER — OXYCODONE HCL ER 10 MG PO T12A
30.00 mg | EXTENDED_RELEASE_TABLET | ORAL | Status: DC
Start: 2017-02-14 — End: 2017-02-14

## 2017-02-14 MED ORDER — ZOLPIDEM TARTRATE 5 MG PO TABS
5.00 mg | ORAL_TABLET | ORAL | Status: DC
Start: ? — End: 2017-02-14

## 2017-02-14 MED ORDER — SODIUM CHLORIDE 0.9 % IJ SOLN
10.00 mL | INTRAMUSCULAR | Status: DC
Start: ? — End: 2017-02-14

## 2017-02-14 MED ORDER — LEVOTHYROXINE SODIUM 75 MCG PO TABS
75.00 | ORAL_TABLET | ORAL | Status: DC
Start: 2017-02-15 — End: 2017-02-14

## 2017-02-14 MED ORDER — CHOLECALCIFEROL 25 MCG (1000 UT) PO TABS
1000.00 | ORAL_TABLET | ORAL | Status: DC
Start: 2017-02-15 — End: 2017-02-14

## 2017-02-14 NOTE — Patient Outreach (Signed)
[facility]) Care Management  02/14/2017  Crystal Clarke 03/09/1961 [id number]  Unsuccessful phone outreach to patient to follow-up on potential concerns/questions.   No answer, noted per chart review, patient appears to be admitted at this time.   Plan:  Will follow-up next week if patient discharged---will update [facility] RN and LCSW.     [name], PharmD, [place] [phone]

## 2017-02-16 ENCOUNTER — Other Ambulatory Visit: Payer: Self-pay | Admitting: *Deleted

## 2017-02-16 ENCOUNTER — Other Ambulatory Visit: Payer: Self-pay

## 2017-02-16 NOTE — Patient Outreach (Signed)
[place] [facility]) Care Management  02/16/2017  Bina Veenstra 05/04/1960 [id number]   CSW received call from Tomasa Rand, RN, [place], that patient was released from hospital in [facility] on yesterday and is not doing well. CSW contacted patient's caregiver/ex-husband and HCPOA, Richard,  Who reported she was unable to get out of bed/chair without a lot of assistance and he is limited due to his own disability and sickness. CSW made several calls to [place], hospital, sister, etc. Delfino Lovett indicates that she has been able to make some progress with ambulation and at this time he is going to wait for [facility] services to come out. CSW advised that [facility] has orders for PT and OT and is requesting to add SW to assist/arrange possible SF placement at [facility] (preferred location per family).  This  CSW has contacted  PCP office to advise of the above and to request assistance with [facility] SW order as well as SNF paperwork.    This CSW will plan outreach next week for update-  Delfino Lovett reports he will "call for EMS to take her back to hospital if things get worse".   [name], MSW, [place] Worker  [place] [phone]

## 2017-02-16 NOTE — Patient Outreach (Signed)
Transition of care: Placed call to patient who reports that she got home from the hospital yesterday (11/29)  Reports that she is not able to take care of herself and asked me to speak with her ex husband.   Patient handed phone to [place].  Richard Buley states that she is not able to walk and is not able to take care of herself. Reports patient has been confused. Report that she has been in the bed since he got her home yesterday.  Mr. Royer states that he contact the Social Worker at [employer] and she states that patient does not remember talking to a Education officer, museum.  Mr. Behan reports that the social worker at the hospital states that patient refused home health and refused rehab.  Mr. Carbone is concerned that patient can not take care of herself.  Reports that she is not eating. Mr. Nicklin reports that patient needs to go to a facility and she is willing.  Unable to complete transition of care call about medications due to ex husbands anxiety to have something done right now about placement.   I informed Mr. Prest that I would call my social worker [name] and have her call him about options.  Placed call to [name] to inform of current situation and patient with an emergency need for placement.   PLAN: Will follow up with [facility] social worker about resolution.  Tomasa Rand, RN, BSN, CEN [facility] [employer] [phone]

## 2017-02-17 DIAGNOSIS — Z452 Encounter for adjustment and management of vascular access device: Secondary | ICD-10-CM | POA: Diagnosis not present

## 2017-02-17 DIAGNOSIS — M25551 Pain in right hip: Secondary | ICD-10-CM | POA: Diagnosis not present

## 2017-02-17 DIAGNOSIS — K219 Gastro-esophageal reflux disease without esophagitis: Secondary | ICD-10-CM | POA: Diagnosis not present

## 2017-02-17 DIAGNOSIS — R001 Bradycardia, unspecified: Secondary | ICD-10-CM | POA: Diagnosis not present

## 2017-02-17 DIAGNOSIS — E039 Hypothyroidism, unspecified: Secondary | ICD-10-CM | POA: Diagnosis not present

## 2017-02-17 DIAGNOSIS — E440 Moderate protein-calorie malnutrition: Secondary | ICD-10-CM | POA: Diagnosis not present

## 2017-02-17 DIAGNOSIS — Z87891 Personal history of nicotine dependence: Secondary | ICD-10-CM | POA: Diagnosis not present

## 2017-02-17 DIAGNOSIS — R1909 Other intra-abdominal and pelvic swelling, mass and lump: Secondary | ICD-10-CM | POA: Diagnosis not present

## 2017-02-17 DIAGNOSIS — M87851 Other osteonecrosis, right femur: Secondary | ICD-10-CM | POA: Diagnosis not present

## 2017-02-17 DIAGNOSIS — Z471 Aftercare following joint replacement surgery: Secondary | ICD-10-CM | POA: Diagnosis not present

## 2017-02-17 DIAGNOSIS — J189 Pneumonia, unspecified organism: Secondary | ICD-10-CM | POA: Diagnosis not present

## 2017-02-17 DIAGNOSIS — R531 Weakness: Secondary | ICD-10-CM | POA: Diagnosis not present

## 2017-02-17 DIAGNOSIS — I130 Hypertensive heart and chronic kidney disease with heart failure and stage 1 through stage 4 chronic kidney disease, or unspecified chronic kidney disease: Secondary | ICD-10-CM | POA: Diagnosis not present

## 2017-02-17 DIAGNOSIS — Y95 Nosocomial condition: Secondary | ICD-10-CM | POA: Diagnosis not present

## 2017-02-17 DIAGNOSIS — I129 Hypertensive chronic kidney disease with stage 1 through stage 4 chronic kidney disease, or unspecified chronic kidney disease: Secondary | ICD-10-CM | POA: Diagnosis not present

## 2017-02-17 DIAGNOSIS — Z96653 Presence of artificial knee joint, bilateral: Secondary | ICD-10-CM | POA: Diagnosis not present

## 2017-02-17 DIAGNOSIS — J168 Pneumonia due to other specified infectious organisms: Secondary | ICD-10-CM | POA: Diagnosis not present

## 2017-02-17 DIAGNOSIS — N179 Acute kidney failure, unspecified: Secondary | ICD-10-CM | POA: Diagnosis not present

## 2017-02-17 DIAGNOSIS — I2510 Atherosclerotic heart disease of native coronary artery without angina pectoris: Secondary | ICD-10-CM | POA: Diagnosis not present

## 2017-02-17 DIAGNOSIS — A419 Sepsis, unspecified organism: Secondary | ICD-10-CM | POA: Diagnosis not present

## 2017-02-17 DIAGNOSIS — M25451 Effusion, right hip: Secondary | ICD-10-CM | POA: Diagnosis not present

## 2017-02-17 DIAGNOSIS — Z7982 Long term (current) use of aspirin: Secondary | ICD-10-CM | POA: Diagnosis not present

## 2017-02-17 DIAGNOSIS — J181 Lobar pneumonia, unspecified organism: Secondary | ICD-10-CM | POA: Diagnosis not present

## 2017-02-17 DIAGNOSIS — Z8673 Personal history of transient ischemic attack (TIA), and cerebral infarction without residual deficits: Secondary | ICD-10-CM | POA: Diagnosis not present

## 2017-02-17 DIAGNOSIS — M329 Systemic lupus erythematosus, unspecified: Secondary | ICD-10-CM | POA: Diagnosis not present

## 2017-02-17 DIAGNOSIS — N184 Chronic kidney disease, stage 4 (severe): Secondary | ICD-10-CM | POA: Diagnosis not present

## 2017-02-17 DIAGNOSIS — Z96642 Presence of left artificial hip joint: Secondary | ICD-10-CM | POA: Diagnosis not present

## 2017-02-17 DIAGNOSIS — Z96652 Presence of left artificial knee joint: Secondary | ICD-10-CM | POA: Diagnosis not present

## 2017-02-17 DIAGNOSIS — N189 Chronic kidney disease, unspecified: Secondary | ICD-10-CM | POA: Diagnosis not present

## 2017-02-17 DIAGNOSIS — I509 Heart failure, unspecified: Secondary | ICD-10-CM | POA: Diagnosis not present

## 2017-02-17 DIAGNOSIS — T8454XD Infection and inflammatory reaction due to internal left knee prosthesis, subsequent encounter: Secondary | ICD-10-CM | POA: Diagnosis not present

## 2017-02-17 DIAGNOSIS — Z951 Presence of aortocoronary bypass graft: Secondary | ICD-10-CM | POA: Diagnosis not present

## 2017-02-17 DIAGNOSIS — Z882 Allergy status to sulfonamides status: Secondary | ICD-10-CM | POA: Diagnosis not present

## 2017-02-17 DIAGNOSIS — Z79899 Other long term (current) drug therapy: Secondary | ICD-10-CM | POA: Diagnosis not present

## 2017-02-17 DIAGNOSIS — R600 Localized edema: Secondary | ICD-10-CM | POA: Diagnosis not present

## 2017-02-17 DIAGNOSIS — I7300 Raynaud's syndrome without gangrene: Secondary | ICD-10-CM | POA: Diagnosis not present

## 2017-02-17 DIAGNOSIS — R404 Transient alteration of awareness: Secondary | ICD-10-CM | POA: Diagnosis not present

## 2017-02-19 ENCOUNTER — Other Ambulatory Visit: Payer: Self-pay

## 2017-02-19 MED ORDER — SODIUM CHLORIDE 0.9 % IV SOLN
INTRAVENOUS | Status: DC
Start: ? — End: 2017-02-19

## 2017-02-19 MED ORDER — LEVOTHYROXINE SODIUM 75 MCG PO TABS
75.00 | ORAL_TABLET | ORAL | Status: DC
Start: 2017-02-24 — End: 2017-02-19

## 2017-02-19 MED ORDER — GUAIFENESIN ER 600 MG PO TB12
600.00 mg | ORAL_TABLET | ORAL | Status: DC
Start: 2017-02-23 — End: 2017-02-19

## 2017-02-19 MED ORDER — ONDANSETRON HCL 4 MG/2ML IJ SOLN
4.00 mg | INTRAMUSCULAR | Status: DC
Start: ? — End: 2017-02-19

## 2017-02-19 MED ORDER — HYDRALAZINE HCL 20 MG/ML IJ SOLN
10.00 mg | INTRAMUSCULAR | Status: DC
Start: ? — End: 2017-02-19

## 2017-02-19 MED ORDER — ALBUTEROL SULFATE (2.5 MG/3ML) 0.083% IN NEBU
2.50 mg | INHALATION_SOLUTION | RESPIRATORY_TRACT | Status: DC
Start: ? — End: 2017-02-19

## 2017-02-19 MED ORDER — CARVEDILOL 3.125 MG PO TABS
6.25 mg | ORAL_TABLET | ORAL | Status: DC
Start: 2017-02-24 — End: 2017-02-19

## 2017-02-19 MED ORDER — METHYLPREDNISOLONE SODIUM SUCC 40 MG IJ SOLR
40.00 mg | INTRAMUSCULAR | Status: DC
Start: 2017-02-19 — End: 2017-02-19

## 2017-02-19 MED ORDER — GENERIC EXTERNAL MEDICATION
500.00 mg | Status: DC
Start: 2017-02-21 — End: 2017-02-19

## 2017-02-19 MED ORDER — CHOLECALCIFEROL 25 MCG (1000 UT) PO TABS
1000.00 | ORAL_TABLET | ORAL | Status: DC
Start: 2017-02-24 — End: 2017-02-19

## 2017-02-19 MED ORDER — HYDROCODONE-ACETAMINOPHEN 5-325 MG PO TABS
1.00 | ORAL_TABLET | ORAL | Status: DC
Start: ? — End: 2017-02-19

## 2017-02-19 MED ORDER — PANTOPRAZOLE SODIUM 40 MG PO TBEC
40.00 mg | DELAYED_RELEASE_TABLET | ORAL | Status: DC
Start: 2017-02-24 — End: 2017-02-19

## 2017-02-19 MED ORDER — ATORVASTATIN CALCIUM 10 MG PO TABS
10.00 mg | ORAL_TABLET | ORAL | Status: DC
Start: 2017-02-24 — End: 2017-02-19

## 2017-02-19 MED ORDER — ASPIRIN EC 81 MG PO TBEC
81.00 mg | DELAYED_RELEASE_TABLET | ORAL | Status: DC
Start: 2017-02-24 — End: 2017-02-19

## 2017-02-19 MED ORDER — ACETAMINOPHEN 325 MG PO TABS
650.00 mg | ORAL_TABLET | ORAL | Status: DC
Start: ? — End: 2017-02-19

## 2017-02-19 MED ORDER — GENERIC EXTERNAL MEDICATION
325.00 mg | Status: DC
Start: 2017-02-24 — End: 2017-02-19

## 2017-02-19 MED ORDER — EZETIMIBE 10 MG PO TABS
10.00 mg | ORAL_TABLET | ORAL | Status: DC
Start: 2017-02-23 — End: 2017-02-19

## 2017-02-19 MED ORDER — ZOLPIDEM TARTRATE 5 MG PO TABS
5.00 mg | ORAL_TABLET | ORAL | Status: DC
Start: ? — End: 2017-02-19

## 2017-02-19 NOTE — Patient Outreach (Signed)
Transition of care:  Placed call to patient who answered and sounds strong on the phone. Reports that she was readmitted to [facility] on 02/17/2017 with pneumonia and abscess in the hip. Reports that she is breathing better and can think clearly now. Reports she is having a drainage procedure today for her hip.  She reports after discharge from hospital she is planning to go to [facility].    Plan: Will notify [facility] team members and have social worker let follow up on discharge plans.   Patient wants to go to [facility].  Tomasa Rand, RN, BSN, CEN [facility] [employer] [phone]

## 2017-02-20 ENCOUNTER — Other Ambulatory Visit: Payer: Self-pay | Admitting: *Deleted

## 2017-02-21 MED ORDER — OXYCODONE HCL 5 MG PO TABS
5.00 mg | ORAL_TABLET | ORAL | Status: DC
Start: ? — End: 2017-02-21

## 2017-02-21 MED ORDER — QUETIAPINE FUMARATE ER 50 MG PO TB24
150.00 mg | ORAL_TABLET | ORAL | Status: DC
Start: 2017-02-23 — End: 2017-02-21

## 2017-02-21 MED ORDER — ZOLPIDEM TARTRATE 5 MG PO TABS
10.00 mg | ORAL_TABLET | ORAL | Status: DC
Start: ? — End: 2017-02-21

## 2017-02-21 MED ORDER — AMLODIPINE BESYLATE 5 MG PO TABS
5.00 mg | ORAL_TABLET | ORAL | Status: DC
Start: 2017-02-22 — End: 2017-02-21

## 2017-02-21 MED ORDER — GENERIC EXTERNAL MEDICATION
750.00 mg | Status: DC
Start: 2017-02-22 — End: 2017-02-21

## 2017-02-21 MED ORDER — OXYCODONE HCL ER 15 MG PO T12A
15.00 mg | EXTENDED_RELEASE_TABLET | ORAL | Status: DC
Start: 2017-02-23 — End: 2017-02-21

## 2017-02-21 MED ORDER — ISOSORBIDE MONONITRATE ER 30 MG PO TB24
30.00 mg | ORAL_TABLET | ORAL | Status: DC
Start: 2017-02-23 — End: 2017-02-21

## 2017-02-21 MED ORDER — PREDNISONE 20 MG PO TABS
40.00 mg | ORAL_TABLET | ORAL | Status: DC
Start: 2017-02-24 — End: 2017-02-21

## 2017-02-21 MED ORDER — GENERIC EXTERNAL MEDICATION
300.00 | Status: DC
Start: 2017-02-24 — End: 2017-02-21

## 2017-02-21 NOTE — Patient Outreach (Signed)
[place] [facility]) Care Management  [facility] Social Work  02/21/2017  Crystal Clarke 01/12/1961 [id number]  Subjective:  "Crystal Clarke was hospittalized again with pneumonia". Objective:  [facility] CSW to assist patient and family with community based resources to aide in their well-being, quality of life and overall safety and needs.   Current Medications:  Current Outpatient Medications  Medication Sig Dispense Refill  . ALPRAZolam (XANAX) 0.5 MG tablet Take 0.5 mg by mouth 2 (two) times daily as needed.    Marland Kitchen apixaban (ELIQUIS) 2.5 MG TABS tablet Take 2.5 mg by mouth 2 (two) times daily.    Marland Kitchen aspirin 81 MG chewable tablet Chew 81 mg by mouth daily.     . carvedilol (COREG) 6.25 MG tablet Take 6.25 mg by mouth 2 (two) times daily with a meal. For BP greater than 100.    . cholecalciferol (VITAMIN D) 1000 units tablet Take 1,000 Units by mouth daily.    Marland Kitchen dexlansoprazole (DEXILANT) 60 MG capsule Take 60 mg by mouth daily.    . diclofenac sodium (VOLTAREN) 1 % GEL Apply topically 4 (four) times daily.    Marland Kitchen epoetin alfa (EPOGEN,PROCRIT) 42706 UNIT/ML injection Inject 20,000 Units into the skin. Every other week    . ezetimibe (ZETIA) 10 MG tablet Take 10 mg by mouth daily.    . ferrous sulfate 325 (65 FE) MG tablet Take 325 mg by mouth 2 (two) times daily with a meal.     . FLUoxetine (PROZAC) 40 MG capsule Take 40 mg by mouth daily.     . isosorbide mononitrate (IMDUR) 30 MG 24 hr tablet Take 30 mg by mouth 2 (two) times daily.    Marland Kitchen levothyroxine (SYNTHROID, LEVOTHROID) 75 MCG tablet Take 75 mcg by mouth daily before breakfast.    . nitroGLYCERIN (NITROSTAT) 0.4 MG SL tablet Place 0.4 mg under the tongue every 5 (five) minutes as needed.    Marland Kitchen oxyCODONE 30 MG 12 hr tablet Take 1 tablet by mouth 2 (two) times daily.    . Oxycodone HCl 10 MG TABS Take 1 tablet by mouth 2 (two) times daily as needed.    Marland Kitchen QUEtiapine Fumarate (SEROQUEL XR) 150 MG 24 hr tablet Take 150 mg by mouth at bedtime.    .  ranitidine (ZANTAC) 150 MG capsule Take 150 mg by mouth at bedtime.    [name] (SENNA PLUS PO) Take 2 tablets by mouth 2 (two) times daily.    . simvastatin (ZOCOR) 40 MG tablet Take 40 mg by mouth at bedtime.    Marland Kitchen zolpidem (AMBIEN) 10 MG tablet Take 10 mg by mouth at bedtime as needed.     No current facility-administered medications for this visit.     Functional Status:  In your present state of health, do you have any difficulty performing the following activities: 01/04/2017  Hearing? N  Vision? N  Difficulty concentrating or making decisions? N  Walking or climbing stairs? Y  Dressing or bathing? N  Doing errands, shopping? N  Preparing Food and eating ? Y  Using the Toilet? Y  In the past six months, have you accidently leaked urine? Y  Do you have problems with loss of bowel control? N  Managing your Medications? N  Managing your Finances? N  Housekeeping or managing your Housekeeping? Y  Some recent data might be hidden    Fall/Depression Screening:  Fall Risk  01/04/2017 12/28/2016  Falls in the past year? Yes Yes  Number falls in past  yr: 2 or more 2 or more  Injury with Fall? Yes Yes  Risk Factor Category  High Fall Risk High Fall Risk  Risk for fall due to : Impaired mobility History of fall(s)  Follow up Falls prevention discussed Follow up appointment  Comment - patients primary MD appointment attended by [facility]. Primary MD discussed falls with patient.    PHQ 2/9 Scores 01/04/2017 12/28/2016  PHQ - 2 Score 2 3  PHQ- 9 Score 3 10    Assessment: CSW returned phone call from [place] who reports patient was seen by [facility] PT over the weekend and readmitted to hospital for pneumonia. CSW discussed with Delfino Lovett the importance of making sure she gets the level of care needed at dc; probable SNF. He indicates they are anticipating SNF rehab at [facility].  Plan:  CSW will follow along for updates and planning post hospitalization.     [name], MSW, [place] Worker  [place] [phone]

## 2017-02-23 ENCOUNTER — Other Ambulatory Visit: Payer: Self-pay | Admitting: *Deleted

## 2017-02-23 MED ORDER — AMLODIPINE BESYLATE 5 MG PO TABS
10.00 mg | ORAL_TABLET | ORAL | Status: DC
Start: 2017-02-24 — End: 2017-02-23

## 2017-02-23 MED ORDER — CHLORTHALIDONE 25 MG PO TABS
25.00 mg | ORAL_TABLET | ORAL | Status: DC
Start: 2017-02-24 — End: 2017-02-23

## 2017-02-23 MED ORDER — GENERIC EXTERNAL MEDICATION
1.00 g | Status: DC
Start: 2017-02-24 — End: 2017-02-23

## 2017-02-23 MED ORDER — GENERIC EXTERNAL MEDICATION
500.00 mg | Status: DC
Start: 2017-02-23 — End: 2017-02-23
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
## 2017-02-27 ENCOUNTER — Other Ambulatory Visit: Payer: Self-pay
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
## 2017-03-06 ENCOUNTER — Other Ambulatory Visit: Payer: Self-pay

## 2017-03-06 ENCOUNTER — Ambulatory Visit: Payer: Self-pay

## 2017-03-06 DIAGNOSIS — M3214 Glomerular disease in systemic lupus erythematosus: Secondary | ICD-10-CM | POA: Diagnosis not present

## 2017-03-06 DIAGNOSIS — N185 Chronic kidney disease, stage 5: Secondary | ICD-10-CM | POA: Diagnosis not present

## 2017-03-06 DIAGNOSIS — M545 Low back pain: Secondary | ICD-10-CM | POA: Diagnosis not present

## 2017-03-06 DIAGNOSIS — B373 Candidiasis of vulva and vagina: Secondary | ICD-10-CM | POA: Diagnosis not present

## 2017-03-06 DIAGNOSIS — J158 Pneumonia due to other specified bacteria: Secondary | ICD-10-CM | POA: Diagnosis not present

## 2017-03-06 DIAGNOSIS — M25551 Pain in right hip: Secondary | ICD-10-CM | POA: Diagnosis not present

## 2017-03-06 DIAGNOSIS — I10 Essential (primary) hypertension: Secondary | ICD-10-CM | POA: Diagnosis not present

## 2017-03-06 NOTE — Patient Outreach (Signed)
Transition of care: Placed call to patient with no answer. No machine.  PLAN: Will continue weekly transition of care calls.  Tomasa Rand, RN, BSN, CEN [facility] [employer] [phone]

## 2017-03-07 ENCOUNTER — Other Ambulatory Visit: Payer: Self-pay | Admitting: *Deleted
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
## 2017-03-14 ENCOUNTER — Other Ambulatory Visit: Payer: Self-pay | Admitting: Pharmacist

## 2017-03-14 ENCOUNTER — Ambulatory Visit: Payer: Self-pay

## 2017-03-14 ENCOUNTER — Other Ambulatory Visit: Payer: Self-pay | Admitting: *Deleted

## 2017-03-14 NOTE — Patient Outreach (Addendum)
[place] [facility]) Care Management  [place]   03/14/2017  Crystal Clarke Sep 06, 1960 [id number]  Subjective:  Successful phone outreach to patient, HIPAA details verified.  Purpose of call to discuss patient's discharge medications with her.  She was admitted at [facility] 12/[facility]-12/8/18.    Patient's past medical history is significant for coronary artery disease, hypertension, hypothyroidism, chronic kidney disease, lupus.    She reports she manages her medications okay on her own and denies medication related questions---she reports she is able to review her medications over the phone.     Objective:   Current Medications: Current Outpatient Medications  Medication Sig Dispense Refill  . ALPRAZolam (XANAX) 0.5 MG tablet Take 0.5 mg by mouth 2 (two) times daily as needed.    Marland Kitchen apixaban (ELIQUIS) 2.5 MG TABS tablet Take 2.5 mg by mouth 2 (two) times daily.    Marland Kitchen aspirin 81 MG chewable tablet Chew 81 mg by mouth daily.     . carvedilol (COREG) 6.25 MG tablet Take 6.25 mg by mouth 2 (two) times daily with a meal. For BP greater than 100.    . cholecalciferol (VITAMIN D) 1000 units tablet Take 1,000 Units by mouth daily.    Marland Kitchen dexlansoprazole (DEXILANT) 60 MG capsule Take 60 mg by mouth daily.    . diclofenac sodium (VOLTAREN) 1 % GEL Apply topically 4 (four) times daily.    Marland Kitchen epoetin alfa (EPOGEN,PROCRIT) 71062 UNIT/ML injection Inject 20,000 Units into the skin. Every other week    . ezetimibe (ZETIA) 10 MG tablet Take 10 mg by mouth daily.    . ferrous sulfate 325 (65 FE) MG tablet Take 325 mg by mouth 2 (two) times daily with a meal.     . FLUoxetine (PROZAC) 40 MG capsule Take 40 mg by mouth daily.     . isosorbide mononitrate (IMDUR) 30 MG 24 hr tablet Take 30 mg by mouth 2 (two) times daily.    Marland Kitchen levothyroxine (SYNTHROID, LEVOTHROID) 75 MCG tablet Take 75 mcg by mouth daily before breakfast.    . nitroGLYCERIN (NITROSTAT) 0.4 MG SL tablet Place 0.4  mg under the tongue every 5 (five) minutes as needed.    Marland Kitchen oxyCODONE 30 MG 12 hr tablet Take 1 tablet by mouth 2 (two) times daily.    . Oxycodone HCl 10 MG TABS Take 1 tablet by mouth 2 (two) times daily as needed.    Marland Kitchen QUEtiapine Fumarate (SEROQUEL XR) 150 MG 24 hr tablet Take 150 mg by mouth at bedtime.    . ranitidine (ZANTAC) 150 MG capsule Take 150 mg by mouth at bedtime.    [name] (SENNA PLUS PO) Take 2 tablets by mouth 2 (two) times daily.    . simvastatin (ZOCOR) 40 MG tablet Take 40 mg by mouth at bedtime.    Marland Kitchen zolpidem (AMBIEN) 10 MG tablet Take 10 mg by mouth at bedtime as needed.     No current facility-administered medications for this visit.     Functional Status: In your present state of health, do you have any difficulty performing the following activities: 01/04/2017  Hearing? N  Vision? N  Difficulty concentrating or making decisions? N  Walking or climbing stairs? Y  Dressing or bathing? N  Doing errands, shopping? N  Preparing Food and eating ? Y  Using the Toilet? Y  In the past six months, have you accidently leaked urine? Y  Do you have problems with loss of bowel control?  N  Managing your Medications? N  Managing your Finances? N  Housekeeping or managing your Housekeeping? Y  Some recent data might be hidden    Fall/Depression Screening: Fall Risk  01/04/2017 12/28/2016  Falls in the past year? Yes Yes  Number falls in past yr: 2 or more 2 or more  Injury with Fall? Yes Yes  Risk Factor Category  High Fall Risk High Fall Risk  Risk for fall due to : Impaired mobility History of fall(s)  Follow up Falls prevention discussed Follow up appointment  Comment - patients primary MD appointment attended by [facility]. Primary MD discussed falls with patient.    PHQ 2/9 Scores 01/04/2017 12/28/2016  PHQ - 2 Score 2 3  PHQ- 9 Score 3 10    ASSESSMENT: Date Discharged from Hospital: 02/24/17 Date Medication Reconciliation Performed:  03/14/2017  Medications Discontinued at Discharge:  Per discharge summary available via care everywhere in chart  Fluoxetine (patient reports she continued)  Lisinopril  Loratadine  Losartan  New Medications at Discharge:  Per discharge summary available via care everywhere in chart  Amlodipine (patient reports she doesn't have)   Chlorthalidone  Prednisone taper   Patient was recently discharged from hospital and all medications have been reviewed  Drugs sorted by system:  Neurologic/Psychologic: -alprazolam as needed -fluoxetine---patient reports 20 mg daily  -quetiapine  -zolpidem   Cardiovascular: -aspirin -carvedilol -ezetimibe -isosorbide mononitrate -sublingual nitroglycerin as needed  -atorvastatin   Gastrointestinal: -dexlansoprazole  -ranitidine  -senna/docusate   Endocrine: -levothyroxine   Pain: -diclofenac gel  -oxycodone IR as needed -oxycodone ER  Vitamins/Minerals: -vitamin D  -ferrous sulfate  Miscellaneous: -epoetin alfa   Drug interactions:  -increased risk of CNS depression with alprazolam, zolpidem, quetiapine, oxycodone  Other issues noted:  -simvastatin was on hospital discharge medication list---patient reports using atorvastatin---please confirm  -fluoxetine was discontinued on discharge medication list---patient reports still taking fluoxetine 20 mg daily----please confirm  -apixaban---patient reports this was for DVT prophylaxis post hip surgery---please confirm medication was to be stopped.    -amlodipine---on hospital discharge medication list---patient reports not using---please confirm    -ferrous sulfate once daily on discharge medication list---patient reports twice daily---please confirm    PLAN: Patient counseled to take new medications as prescribed and discontinue old medications as prescribed   Note routed to PCP.    Will place follow-up call to PCP regarding medication discrepancies to clarify  medication PCP has for patient.   [name], PharmD, [place] [phone]

## 2017-03-14 NOTE — Patient Outreach (Signed)
[facility]) Care Management  03/14/2017  Crystal Clarke 12-04-1960 [id number]   CSW spoke with patient and Crystal Clarke by phone today. They report she is doing much better; "she's like a new person". Patient saw her PCP last week per [facility] and will see the orthopedic doctor 04/18/17.  Both patient and Crystal Clarke deny any needs from [place].  They had reported that the [facility] agency was not coming out because she did not have a PCP- CSW contacted [facility] rep who was previously involved and left message for follow up call regarding the PCP and possible [place] services to resume.   CSW has provided resources to patient and will send additional resources via mail. .CSW will plan a follow up call in 7-10 days for further assessment and plan visit if desired or case closure at that time.   [name], MSW, [place] Worker  [place] [phone]

## 2017-03-15 ENCOUNTER — Other Ambulatory Visit: Payer: Self-pay

## 2017-03-15 NOTE — Patient Outreach (Signed)
Transition of care:  Placed call to patient who reports that she is doing well. Reports she continues to use her walker.  Reports she is living alone again with her ex husband checking on her on a regular basis.  Reports driving short distances.  Reports she is eating well.  Denies any weakness of shortness of breath. Reports home health never started due to a misunderstanding with her primary MD. Reports that she continues to see Dr. Tobie Poet.    PLAN: Will continue weekly transition of care calls.  Encouraged patient to call sooner if needed.  Tomasa Rand, RN, BSN, CEN [facility] [employer] [phone]

## 2017-03-21 ENCOUNTER — Other Ambulatory Visit: Payer: Self-pay | Admitting: Pharmacist

## 2017-03-21 DIAGNOSIS — Z96652 Presence of left artificial knee joint: Secondary | ICD-10-CM | POA: Diagnosis not present

## 2017-03-21 DIAGNOSIS — T8454XD Infection and inflammatory reaction due to internal left knee prosthesis, subsequent encounter: Secondary | ICD-10-CM | POA: Diagnosis not present

## 2017-03-21 NOTE — Patient Outreach (Signed)
[place] [facility]) Care Management  03/21/2017  Peggy Loge 10/28/1960 [id number]  Placed call to PCP office, spoke with [facility], medical assistant.  Jasmine reports PCP medication list contains fluoxetine, but not atorvastatin, amlodipine, ferrous sulfate.  She requested [facility] Pharmacist note be re-faxed for PCP review.  Medication review was per patient report and discharge summary available via care everywhere in chart.   Plan:  03/14/17 note refaxed to PCP.   Will place follow-up call to PCP office in the next week.   [name], PharmD, [place] [phone]

## 2017-03-22 ENCOUNTER — Ambulatory Visit: Payer: Self-pay

## 2017-03-23 ENCOUNTER — Other Ambulatory Visit: Payer: Self-pay | Admitting: *Deleted

## 2017-03-23 ENCOUNTER — Other Ambulatory Visit: Payer: Self-pay

## 2017-03-23 NOTE — Patient Outreach (Signed)
Transition of care: Placed call to patient who answered and reports that she is doing well. Reports that she is using her walker without any problems. Reports that she has a cyst that has formed on her elbow and it is warm to the touch and painful.  PLAN: encouraged patient to call MD now after our phone call to get direction and she agreed. Will continue transition of care.  Tomasa Rand, RN, BSN, CEN [facility] [employer] [phone]

## 2017-03-26 ENCOUNTER — Other Ambulatory Visit: Payer: Self-pay | Admitting: Pharmacist

## 2017-03-26 DIAGNOSIS — M19021 Primary osteoarthritis, right elbow: Secondary | ICD-10-CM | POA: Diagnosis not present

## 2017-03-26 DIAGNOSIS — J158 Pneumonia due to other specified bacteria: Secondary | ICD-10-CM | POA: Diagnosis not present

## 2017-03-26 DIAGNOSIS — Z951 Presence of aortocoronary bypass graft: Secondary | ICD-10-CM | POA: Diagnosis not present

## 2017-03-26 DIAGNOSIS — M25421 Effusion, right elbow: Secondary | ICD-10-CM | POA: Diagnosis not present

## 2017-03-26 DIAGNOSIS — M25521 Pain in right elbow: Secondary | ICD-10-CM | POA: Diagnosis not present

## 2017-03-26 DIAGNOSIS — R05 Cough: Secondary | ICD-10-CM | POA: Diagnosis not present

## 2017-03-26 NOTE — Patient Outreach (Signed)
[place] [facility]) Care Management  03/26/2017  Crystal Clarke Mar 09, 1961 [id number]  Call placed to PCP office, Dr Olivia Canter, to follow-up on medication discrepancies noted from medication review with patient last month.  Message left requesting return call.   Received a call back from [place], with Dr Raliegh Ip Cox---she confirmed PCP did receive medication review note [facility] Pharmacist re-faxed last week.  Asked to clarify discrepancies, Chrys Racer reports PCP does not have a statin on medication list and still had patient taking fluoxetine 20 mg.  She stated patient was in to be seen today and placed [facility] Pharmacist on hold.  She reported she was going to contact patient to clarify something and call [facility] Pharmacist back.   At this time, no return call from PCP office.   Plan:  Will call PCP office again tomorrow.   [name], PharmD, [place] [phone]

## 2017-03-27 NOTE — Patient Outreach (Signed)
[facility]) Care Management  [facility] Social Work  03/27/2017  Crystal Clarke Jan 31, 1961 [id number]  Subjective:  "You can't keep her down"  Objective: [facility] CSW to assist patient and family with community based resources to aide in their well-being, quality of life and overall safety and needs.    Current Medications:  Current Outpatient Medications  Medication Sig Dispense Refill  . ALPRAZolam (XANAX) 0.5 MG tablet Take 0.5 mg by mouth 2 (two) times daily as needed.    Marland Kitchen apixaban (ELIQUIS) 2.5 MG TABS tablet Take 2.5 mg by mouth 2 (two) times daily.    Marland Kitchen aspirin 81 MG chewable tablet Chew 81 mg by mouth daily.     Marland Kitchen atorvastatin (LIPITOR) 40 MG tablet Take 40 mg by mouth daily at 6 PM.    . carvedilol (COREG) 6.25 MG tablet Take 6.25 mg by mouth 2 (two) times daily with a meal. For BP greater than 100.    . chlorthalidone (HYGROTON) 25 MG tablet Take 25 mg by mouth daily.    . cholecalciferol (VITAMIN D) 1000 units tablet Take 1,000 Units by mouth daily.    Marland Kitchen dexlansoprazole (DEXILANT) 60 MG capsule Take 60 mg by mouth daily.    . diclofenac sodium (VOLTAREN) 1 % GEL Apply topically 4 (four) times daily.    Marland Kitchen epoetin alfa (EPOGEN,PROCRIT) 93716 UNIT/ML injection Inject 20,000 Units into the skin. Every other week    . ezetimibe (ZETIA) 10 MG tablet Take 10 mg by mouth daily.    . ferrous sulfate 325 (65 FE) MG tablet Take 325 mg by mouth 2 (two) times daily with a meal.     . FLUoxetine (PROZAC) 20 MG tablet Take 20 mg by mouth daily.     . isosorbide mononitrate (IMDUR) 30 MG 24 hr tablet Take 30 mg by mouth 2 (two) times daily.    Marland Kitchen levothyroxine (SYNTHROID, LEVOTHROID) 75 MCG tablet Take 75 mcg by mouth daily before breakfast.    . nitroGLYCERIN (NITROSTAT) 0.4 MG SL tablet Place 0.4 mg under the tongue every 5 (five) minutes as needed.    Marland Kitchen oxyCODONE 30 MG 12 hr tablet Take 1 tablet by mouth 2 (two) times daily.    . Oxycodone HCl 10 MG TABS Take 1 tablet by mouth 2  (two) times daily as needed.    Marland Kitchen QUEtiapine Fumarate (SEROQUEL XR) 150 MG 24 hr tablet Take 150 mg by mouth at bedtime.    . ranitidine (ZANTAC) 150 MG capsule Take 150 mg by mouth at bedtime.    [facility] Sodium (SENNA PLUS PO) Take 2 tablets by mouth 2 (two) times daily.    Marland Kitchen zolpidem (AMBIEN) 10 MG tablet Take 10 mg by mouth at bedtime as needed.     No current facility-administered medications for this visit.     Functional Status:  In your present state of health, do you have any difficulty performing the following activities: 01/04/2017  Hearing? N  Vision? N  Difficulty concentrating or making decisions? N  Walking or climbing stairs? Y  Dressing or bathing? N  Doing errands, shopping? N  Preparing Food and eating ? Y  Using the Toilet? Y  In the past six months, have you accidently leaked urine? Y  Do you have problems with loss of bowel control? N  Managing your Medications? N  Managing your Finances? N  Housekeeping or managing your Housekeeping? Y  Some recent data might be hidden    Fall/Depression Screening:  Fall  Risk  01/04/2017 12/28/2016  Falls in the past year? Yes Yes  Number falls in past yr: 2 or more 2 or more  Injury with Fall? Yes Yes  Risk Factor Category  High Fall Risk High Fall Risk  Risk for fall due to : Impaired mobility History of fall(s)  Follow up Falls prevention discussed Follow up appointment  Comment - patients primary MD appointment attended by [facility]. Primary MD discussed falls with patient.    PHQ 2/9 Scores 01/04/2017 12/28/2016  PHQ - 2 Score 2 3  PHQ- 9 Score 3 10    Assessment:  CSW spoke with patient and Crystal Clarke who both report she is doing well-physically stronger and doing exercises and staying active. 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  Otherwise, no complaints and no needs.  CSW has encouraged them to make contact with her DSS Case worker  regularly and to turn in a copy of outstanding medical bills periodically for full medicaid determintation.  Both patient and Crystal Clarke deny any further CSW needs at this time. CSW discussed plans to close CSW referral and they are in agreement.  Crystal Clarke, thanked CSW for the "personal touch and caring".  CSW advised for them to reach out if needs arise.    Plan:  Crystal Clarke CM Care Plan Problem One     Most Recent Value  Care Plan Problem One  Patient with physical and financial limitations.  Role Documenting the Problem One  Clinical Social Worker  Care Plan for Problem One  Active  C[facility] Clarke CM Short Term Goal #1   Patient will be linked with services/resources to aide in her needs in the next 30 days.  [facility] CM Short Term Goal #1 Start Date  01/09/17  [facility] CM Short Term Goal #1 Met Date  03/27/17  Interventions for Short Term Goal #1  CSW assiting with referrals and  linking patient with community resources to aide in her needs.   [facility] CM Short Term Goal #2   Patient will report follow up with DSS for ?full Medicaid  d/t medical bills in the next 30 days.  [facility] CM Short Term Goal #2 Start Date  01/09/17  [facility] CM Short Term Goal #2 Met Date  03/27/17  Interventions for Short Term Goal #2  CSW educated and encouraged patient to collect and deliver medical bills to DSS for review and determintation of eligibility for full Medicaid.       CSW will advise [facility] team and PCP of plans to close CSW referral at this time and to re-consult if CSW needs arise.   C[place], MSW, [place] Worker  Crystal Clarke [phone]

## 2017-03-28 ENCOUNTER — Other Ambulatory Visit: Payer: Self-pay | Admitting: Pharmacist

## 2017-03-28 NOTE — Patient Outreach (Signed)
[place] [facility]) Care Management  03/28/2017  Crystal Clarke October 20, 1960 [id number]  Incoming call from [place], with Dr Olivia Canter office.  She reports she has spoken with patient regarding medications and patient will be going to PCP next week to review medications given recent changes.   Chrys Racer reports apixaban was indeed stopped, and that patient reported taking simvastatin and not amlodipine.  Chrys Racer reports she will follow-up with [facility] Pharmacist after patient is seen next week.   Plan:  Outreach to PCP office if no return call by mid week per request of Chrys Racer with PCP office.   [name], PharmD, [place] [phone]

## 2017-03-30 ENCOUNTER — Ambulatory Visit: Payer: Self-pay

## 2017-03-30 ENCOUNTER — Other Ambulatory Visit: Payer: Self-pay

## 2017-03-30 NOTE — Patient Outreach (Signed)
Transition of care: Final Transition of care call  Placed call to patient for transition of care. No answer. Left a message requesting a call back.  PLAN: Will await a call back. Will placed patient on schedule for 30 days.    Tomasa Rand, RN, BSN, CEN [facility] [employer] [phone]

## 2017-04-02 DIAGNOSIS — M11232 Other chondrocalcinosis, left wrist: Secondary | ICD-10-CM | POA: Diagnosis not present

## 2017-04-02 DIAGNOSIS — M1811 Unilateral primary osteoarthritis of first carpometacarpal joint, right hand: Secondary | ICD-10-CM | POA: Diagnosis not present

## 2017-04-02 DIAGNOSIS — N189 Chronic kidney disease, unspecified: Secondary | ICD-10-CM | POA: Diagnosis not present

## 2017-04-02 DIAGNOSIS — M89542 Osteolysis, left hand: Secondary | ICD-10-CM | POA: Diagnosis not present

## 2017-04-02 DIAGNOSIS — Z9889 Other specified postprocedural states: Secondary | ICD-10-CM | POA: Diagnosis not present

## 2017-04-02 DIAGNOSIS — M89541 Osteolysis, right hand: Secondary | ICD-10-CM | POA: Diagnosis not present

## 2017-04-02 DIAGNOSIS — M4696 Unspecified inflammatory spondylopathy, lumbar region: Secondary | ICD-10-CM | POA: Diagnosis not present

## 2017-04-02 DIAGNOSIS — I7300 Raynaud's syndrome without gangrene: Secondary | ICD-10-CM | POA: Diagnosis not present

## 2017-04-02 DIAGNOSIS — M11231 Other chondrocalcinosis, right wrist: Secondary | ICD-10-CM | POA: Diagnosis not present

## 2017-04-02 DIAGNOSIS — M85871 Other specified disorders of bone density and structure, right ankle and foot: Secondary | ICD-10-CM | POA: Diagnosis not present

## 2017-04-02 DIAGNOSIS — Z8739 Personal history of other diseases of the musculoskeletal system and connective tissue: Secondary | ICD-10-CM | POA: Diagnosis not present

## 2017-04-02 DIAGNOSIS — M25832 Other specified joint disorders, left wrist: Secondary | ICD-10-CM | POA: Diagnosis not present

## 2017-04-02 DIAGNOSIS — M1A39X0 Chronic gout due to renal impairment, multiple sites, without tophus (tophi): Secondary | ICD-10-CM | POA: Diagnosis not present

## 2017-04-02 DIAGNOSIS — M159 Polyosteoarthritis, unspecified: Secondary | ICD-10-CM | POA: Diagnosis not present

## 2017-04-02 DIAGNOSIS — M329 Systemic lupus erythematosus, unspecified: Secondary | ICD-10-CM | POA: Diagnosis not present

## 2017-04-02 DIAGNOSIS — M85872 Other specified disorders of bone density and structure, left ankle and foot: Secondary | ICD-10-CM | POA: Diagnosis not present

## 2017-04-02 DIAGNOSIS — M898X6 Other specified disorders of bone, lower leg: Secondary | ICD-10-CM | POA: Diagnosis not present

## 2017-04-02 DIAGNOSIS — M4697 Unspecified inflammatory spondylopathy, lumbosacral region: Secondary | ICD-10-CM | POA: Diagnosis not present

## 2017-04-02 DIAGNOSIS — M8588 Other specified disorders of bone density and structure, other site: Secondary | ICD-10-CM | POA: Diagnosis not present

## 2017-04-02 DIAGNOSIS — M1812 Unilateral primary osteoarthritis of first carpometacarpal joint, left hand: Secondary | ICD-10-CM | POA: Diagnosis not present

## 2017-04-02 DIAGNOSIS — M533 Sacrococcygeal disorders, not elsewhere classified: Secondary | ICD-10-CM | POA: Diagnosis not present

## 2017-04-04 ENCOUNTER — Ambulatory Visit: Payer: Self-pay | Admitting: Pharmacist

## 2017-04-05 ENCOUNTER — Other Ambulatory Visit: Payer: Self-pay | Admitting: Pharmacist

## 2017-04-05 NOTE — Patient Outreach (Signed)
[facility]) Care Management  04/05/2017  Cayla Wiegand 14-Aug-1960 [id number]  [facility] Pharmacist placed phone call to Dr Olivia Canter office, PCP, and spoke with Chrys Racer to follow-up medication discrepancies as PCP office reported last week patient was to be seen this week.   Per Chrys Racer with Dr Olivia Canter, patient is taking fluoxetine 20 mg daily, losartan 25 mg daily, simvastatin 20 mg daily, ferrous sulfate 325 mg daily, patient is not taking atorvastatin, amlodipine, apixiban (Eliquis).  This clarifies medication discrepancies from 03/14/17 note that was faxed to PCP.    Unsuccessful phone outreach attempt to patient to verify if she has further [place] needs---HIPAA compliant message left requesting return call.   Plan:  Will make second outreach attempt to patient next week.   [name], PharmD, [place] [phone]

## 2017-04-09 ENCOUNTER — Other Ambulatory Visit: Payer: Self-pay | Admitting: Pharmacist

## 2017-04-09 NOTE — Patient Outreach (Signed)
[facility]) Care Management  04/09/2017  Crystal Clarke 20-Feb-1961 [id number]  Second outreach attempt to patient successful, HIPAA details verified.  Purpose of call to verify if patient has further pharmacy related needs. Per note in chart on 04/05/17, medication discrepancies were clarified with PCP office, Dr Olivia Canter.   Patient confirms she is taking fluoxetine, losartan, simvastatin, ferrous sulfate once daily, and confirms she is not taking atorvastatin, amlodipine, apixaban.  Patient reported during call she has no further medication related questions/concerns.   Patient counseled she can contact [facility] Pharmacist if new needs arise.   Plan:  Will close case as patient denies further pharmacy related needs.  [name], PharmD, [place] [phone]

## 2017-04-12 DIAGNOSIS — E860 Dehydration: Secondary | ICD-10-CM | POA: Diagnosis not present

## 2017-04-12 DIAGNOSIS — Z96642 Presence of left artificial hip joint: Secondary | ICD-10-CM | POA: Diagnosis not present

## 2017-04-12 DIAGNOSIS — T84021A Dislocation of internal left hip prosthesis, initial encounter: Secondary | ICD-10-CM | POA: Diagnosis not present

## 2017-04-12 DIAGNOSIS — S73005A Unspecified dislocation of left hip, initial encounter: Secondary | ICD-10-CM | POA: Diagnosis not present

## 2017-04-12 DIAGNOSIS — G9340 Encephalopathy, unspecified: Secondary | ICD-10-CM | POA: Diagnosis not present

## 2017-04-12 DIAGNOSIS — J690 Pneumonitis due to inhalation of food and vomit: Secondary | ICD-10-CM | POA: Diagnosis not present

## 2017-04-12 DIAGNOSIS — R031 Nonspecific low blood-pressure reading: Secondary | ICD-10-CM | POA: Diagnosis not present

## 2017-04-12 DIAGNOSIS — N184 Chronic kidney disease, stage 4 (severe): Secondary | ICD-10-CM | POA: Diagnosis not present

## 2017-04-12 DIAGNOSIS — E872 Acidosis: Secondary | ICD-10-CM | POA: Diagnosis not present

## 2017-04-12 DIAGNOSIS — G8929 Other chronic pain: Secondary | ICD-10-CM | POA: Diagnosis not present

## 2017-04-12 DIAGNOSIS — R4182 Altered mental status, unspecified: Secondary | ICD-10-CM | POA: Diagnosis not present

## 2017-04-12 DIAGNOSIS — I129 Hypertensive chronic kidney disease with stage 1 through stage 4 chronic kidney disease, or unspecified chronic kidney disease: Secondary | ICD-10-CM | POA: Diagnosis not present

## 2017-04-12 DIAGNOSIS — I130 Hypertensive heart and chronic kidney disease with heart failure and stage 1 through stage 4 chronic kidney disease, or unspecified chronic kidney disease: Secondary | ICD-10-CM | POA: Diagnosis not present

## 2017-04-12 DIAGNOSIS — M87851 Other osteonecrosis, right femur: Secondary | ICD-10-CM | POA: Diagnosis not present

## 2017-04-12 DIAGNOSIS — M329 Systemic lupus erythematosus, unspecified: Secondary | ICD-10-CM | POA: Diagnosis not present

## 2017-04-12 DIAGNOSIS — J189 Pneumonia, unspecified organism: Secondary | ICD-10-CM | POA: Diagnosis not present

## 2017-04-12 DIAGNOSIS — R1110 Vomiting, unspecified: Secondary | ICD-10-CM | POA: Diagnosis not present

## 2017-04-12 DIAGNOSIS — E039 Hypothyroidism, unspecified: Secondary | ICD-10-CM | POA: Diagnosis not present

## 2017-04-12 DIAGNOSIS — N179 Acute kidney failure, unspecified: Secondary | ICD-10-CM | POA: Diagnosis not present

## 2017-04-12 DIAGNOSIS — K219 Gastro-esophageal reflux disease without esophagitis: Secondary | ICD-10-CM | POA: Diagnosis not present

## 2017-04-12 DIAGNOSIS — E440 Moderate protein-calorie malnutrition: Secondary | ICD-10-CM | POA: Diagnosis not present

## 2017-04-12 DIAGNOSIS — M879 Osteonecrosis, unspecified: Secondary | ICD-10-CM | POA: Diagnosis not present

## 2017-04-12 DIAGNOSIS — I2510 Atherosclerotic heart disease of native coronary artery without angina pectoris: Secondary | ICD-10-CM | POA: Diagnosis not present

## 2017-04-12 DIAGNOSIS — Z66 Do not resuscitate: Secondary | ICD-10-CM | POA: Diagnosis not present

## 2017-04-12 DIAGNOSIS — R55 Syncope and collapse: Secondary | ICD-10-CM | POA: Diagnosis not present

## 2017-04-12 DIAGNOSIS — I509 Heart failure, unspecified: Secondary | ICD-10-CM | POA: Diagnosis not present

## 2017-04-12 DIAGNOSIS — M24452 Recurrent dislocation, left hip: Secondary | ICD-10-CM | POA: Diagnosis not present

## 2017-04-12 DIAGNOSIS — N189 Chronic kidney disease, unspecified: Secondary | ICD-10-CM | POA: Diagnosis not present

## 2017-04-12 DIAGNOSIS — Z951 Presence of aortocoronary bypass graft: Secondary | ICD-10-CM | POA: Diagnosis not present

## 2017-04-12 DIAGNOSIS — G92 Toxic encephalopathy: Secondary | ICD-10-CM | POA: Diagnosis not present

## 2017-04-18 ENCOUNTER — Other Ambulatory Visit: Payer: Self-pay

## 2017-04-18 DIAGNOSIS — M160 Bilateral primary osteoarthritis of hip: Secondary | ICD-10-CM | POA: Diagnosis not present

## 2017-04-18 DIAGNOSIS — M85852 Other specified disorders of bone density and structure, left thigh: Secondary | ICD-10-CM | POA: Diagnosis not present

## 2017-04-18 DIAGNOSIS — Z882 Allergy status to sulfonamides status: Secondary | ICD-10-CM | POA: Diagnosis not present

## 2017-04-18 DIAGNOSIS — M85862 Other specified disorders of bone density and structure, left lower leg: Secondary | ICD-10-CM | POA: Diagnosis not present

## 2017-04-18 DIAGNOSIS — Z881 Allergy status to other antibiotic agents status: Secondary | ICD-10-CM | POA: Diagnosis not present

## 2017-04-18 DIAGNOSIS — S73005D Unspecified dislocation of left hip, subsequent encounter: Secondary | ICD-10-CM | POA: Diagnosis not present

## 2017-04-18 DIAGNOSIS — Z96652 Presence of left artificial knee joint: Secondary | ICD-10-CM | POA: Diagnosis not present

## 2017-04-18 DIAGNOSIS — Z471 Aftercare following joint replacement surgery: Secondary | ICD-10-CM | POA: Diagnosis not present

## 2017-04-18 DIAGNOSIS — Z96642 Presence of left artificial hip joint: Secondary | ICD-10-CM | POA: Diagnosis not present

## 2017-04-18 MED ORDER — CARVEDILOL 3.125 MG PO TABS
6.25 | ORAL_TABLET | ORAL | Status: DC
Start: 2017-04-17 — End: 2017-04-18

## 2017-04-18 MED ORDER — ZOLPIDEM TARTRATE 5 MG PO TABS
5.00 | ORAL_TABLET | ORAL | Status: DC
Start: ? — End: 2017-04-18

## 2017-04-18 MED ORDER — HYDRALAZINE HCL 20 MG/ML IJ SOLN
10.00 | INTRAMUSCULAR | Status: DC
Start: ? — End: 2017-04-18

## 2017-04-18 MED ORDER — BISACODYL 5 MG PO TBEC
10.00 | DELAYED_RELEASE_TABLET | ORAL | Status: DC
Start: ? — End: 2017-04-18

## 2017-04-18 MED ORDER — GENERIC EXTERNAL MEDICATION
500.00 | Status: DC
Start: 2017-04-17 — End: 2017-04-18

## 2017-04-18 MED ORDER — SORBITOL 70 % RE SOLN
30.00 | RECTAL | Status: DC
Start: ? — End: 2017-04-18

## 2017-04-18 MED ORDER — CLONIDINE HCL 0.1 MG PO TABS
0.10 | ORAL_TABLET | ORAL | Status: DC
Start: ? — End: 2017-04-18

## 2017-04-18 MED ORDER — ONDANSETRON HCL 4 MG/2ML IJ SOLN
4.00 | INTRAMUSCULAR | Status: DC
Start: ? — End: 2017-04-18

## 2017-04-18 MED ORDER — AMLODIPINE BESYLATE 5 MG PO TABS
5.00 | ORAL_TABLET | ORAL | Status: DC
Start: 2017-04-18 — End: 2017-04-18

## 2017-04-18 MED ORDER — QUETIAPINE FUMARATE ER 50 MG PO TB24
50.00 | ORAL_TABLET | ORAL | Status: DC
Start: 2017-04-17 — End: 2017-04-18

## 2017-04-18 MED ORDER — SODIUM CHLORIDE 0.9 % IV SOLN
INTRAVENOUS | Status: DC
Start: ? — End: 2017-04-18

## 2017-04-18 MED ORDER — HYDROCODONE-ACETAMINOPHEN 5-325 MG PO TABS
1.00 | ORAL_TABLET | ORAL | Status: DC
Start: ? — End: 2017-04-18

## 2017-04-18 MED ORDER — DIPHENHYDRAMINE HCL 25 MG PO CAPS
25.00 | ORAL_CAPSULE | ORAL | Status: DC
Start: ? — End: 2017-04-18

## 2017-04-18 MED ORDER — LEVOTHYROXINE SODIUM 75 MCG PO TABS
75.00 | ORAL_TABLET | ORAL | Status: DC
Start: 2017-04-18 — End: 2017-04-18

## 2017-04-18 MED ORDER — RAMELTEON 8 MG PO TABS
8.00 | ORAL_TABLET | ORAL | Status: DC
Start: 2017-04-17 — End: 2017-04-18

## 2017-04-18 MED ORDER — ACETAMINOPHEN 325 MG PO TABS
650.00 | ORAL_TABLET | ORAL | Status: DC
Start: ? — End: 2017-04-18

## 2017-04-18 MED ORDER — SENNOSIDES-DOCUSATE SODIUM 8.6-50 MG PO TABS
2.00 | ORAL_TABLET | ORAL | Status: DC
Start: 2017-04-17 — End: 2017-04-18

## 2017-04-18 MED ORDER — ONDANSETRON 4 MG PO TBDP
4.00 | ORAL_TABLET | ORAL | Status: DC
Start: ? — End: 2017-04-18

## 2017-04-18 MED ORDER — FERROUS SULFATE 325 (65 FE) MG PO TABS
325.00 | ORAL_TABLET | ORAL | Status: DC
Start: 2017-04-18 — End: 2017-04-18

## 2017-04-18 MED ORDER — ALUMINUM-MAGNESIUM-SIMETHICONE 200-200-20 MG/5ML PO SUSP
30.00 | ORAL | Status: DC
Start: ? — End: 2017-04-18

## 2017-04-18 MED ORDER — PANTOPRAZOLE SODIUM 40 MG IV SOLR
40.00 | INTRAVENOUS | Status: DC
Start: 2017-04-18 — End: 2017-04-18

## 2017-04-18 MED ORDER — PREDNISONE 10 MG PO TABS
10.00 | ORAL_TABLET | ORAL | Status: DC
Start: 2017-04-18 — End: 2017-04-18

## 2017-04-18 MED ORDER — ATORVASTATIN CALCIUM 10 MG PO TABS
10.00 | ORAL_TABLET | ORAL | Status: DC
Start: 2017-04-17 — End: 2017-04-18

## 2017-04-18 NOTE — Patient Outreach (Signed)
Transition of care: Message received from the office that patient was discharged home from hospital.  Placed call to patient. No answer. Left a message requesting a call back.  PLAN: will attempt another outreach in 24 hours.  Tomasa Rand, RN, BSN, CEN [facility] [employer] [phone]

## 2017-04-19 ENCOUNTER — Other Ambulatory Visit: Payer: Self-pay

## 2017-04-19 DIAGNOSIS — Z7409 Other reduced mobility: Secondary | ICD-10-CM | POA: Diagnosis not present

## 2017-04-19 DIAGNOSIS — R2689 Other abnormalities of gait and mobility: Secondary | ICD-10-CM | POA: Diagnosis not present

## 2017-04-19 NOTE — Patient Outreach (Addendum)
Transition of care:  Placed call to patient who reports that she is doing well. Reports that she was found unresponsive by a neighbor. Reports she had dislocated her hip. Reports she was at [facility] for 5 days with pneumonia.  Reports she is having soreness in her hip. Reports [facility] health PT will start today.  Reports she has a follow up appointment planned with Dr. Tobie Poet on Monday 04/23/2017.   Reports she has all her medications and feels like everything is going well. Offered home visit for 04/24/2017 and patient has accepted.   THN CM Care Plan Problem One     Most Recent Value  Care Plan Problem One  Recent discharge from [facility] for pneumonia and weakness  Role Documenting the Problem One  Care Management [place] for Problem One  Active  [facility] Long Term Goal   Patient will report self managing well at home unassisted in the next 60 days.   THN Long Term Goal Start Date  04/19/17 [date restarted. ]  Interventions for Problem One Long Term Goal  Reviewed transition of care program again. Scheduled home visit.   THN CM Short Term Goal #1   Patient will report follow up with primary MD in the next 10 days  THN CM Short Term Goal #1 Start Date  02/27/17  [facility] CM Short Term Goal #1 Met Date  03/15/17  Interventions for Short Term Goal #1  Reviewed importance of timely follow up with MD. Encouraged patient to make and appointment and if she has problems to let me know.   THN CM Short Term Goal #2   Patient will report improved mobility in the next 7 days.   THN CM Short Term Goal #2 Start Date  03/06/17  [facility] CM Short Term Goal #2 Met Date  03/15/17  Interventions for Short Term Goal #2  Encouraged patient to stay active as she possibly can.   THN CM Short Term Goal #3  Patient will report follow up with MD in 7 days.   THN CM Short Term Goal #3 Start Date  04/19/17  Interventions for Short Tern Goal #3  Encouraged patient to attend MD appointment.        PLAN:  Home visit on 04/24/2017 Tomasa Rand, RN, BSN, CEN [place] Coordinator [phone]

## 2017-04-21 DIAGNOSIS — T8454XD Infection and inflammatory reaction due to internal left knee prosthesis, subsequent encounter: Secondary | ICD-10-CM | POA: Diagnosis not present

## 2017-04-21 DIAGNOSIS — Z96652 Presence of left artificial knee joint: Secondary | ICD-10-CM | POA: Diagnosis not present

## 2017-04-23 DIAGNOSIS — J158 Pneumonia due to other specified bacteria: Secondary | ICD-10-CM | POA: Diagnosis not present

## 2017-04-23 DIAGNOSIS — D631 Anemia in chronic kidney disease: Secondary | ICD-10-CM | POA: Diagnosis not present

## 2017-04-23 DIAGNOSIS — I119 Hypertensive heart disease without heart failure: Secondary | ICD-10-CM | POA: Diagnosis not present

## 2017-04-23 DIAGNOSIS — M24452 Recurrent dislocation, left hip: Secondary | ICD-10-CM | POA: Diagnosis not present

## 2017-04-23 DIAGNOSIS — N184 Chronic kidney disease, stage 4 (severe): Secondary | ICD-10-CM | POA: Diagnosis not present

## 2017-04-24 ENCOUNTER — Other Ambulatory Visit: Payer: Self-pay

## 2017-04-24 NOTE — Patient Outreach (Signed)
[facility]) Care Management   04/24/2017  Crystal Clarke Jan 28, 1961 [id number]  Crystal Clarke is an 57 y.o. female  Subjective:  Feeling better since hospital discharge. Reports she continues to have cough but is improving. No falls since discharge.  Is currently wearing hip brace on the left side.  Using walker as directed. Active with home health PT.  Currently walking without difficulty.  Patient has a good support system and has assistance as needed.   Continues to drive to appointments.   Objective:  Awake and alert. Ambulating without difficulty.  Vitals:   04/24/17 1213  BP: 120/62  Pulse: 74  Resp: 18  SpO2: 96%  Weight: 120 lb (54.4 kg)  Height: 1.651 m ('5\' 5"'$ )   Review of Systems  HENT: Negative.   Eyes:       Wearing glasses  Respiratory: Positive for cough.   Cardiovascular: Negative.   Gastrointestinal: Negative.   Genitourinary: Positive for dysuria and frequency.       Reports she has a yeast infection  Musculoskeletal: Positive for joint pain.  Skin: Negative.   Neurological: Positive for weakness.  Endo/Heme/Allergies: Negative.   Psychiatric/Behavioral: The patient has insomnia.     Physical Exam  Constitutional: She is oriented to person, place, and time. She appears well-developed and well-nourished.  Cardiovascular: Normal rate, normal heart sounds and intact distal pulses.  Respiratory: Effort normal and breath sounds normal.  Lungs clear  GI: Soft. Bowel sounds are normal.  Musculoskeletal: Normal range of motion. She exhibits no edema.  Wearing left hip brace  Neurological: She is alert and oriented to person, place, and time.  Skin: Skin is warm and dry.  Psychiatric: She has a normal mood and affect. Her behavior is normal. Judgment and thought content normal.    Encounter Medications:   Outpatient Encounter Medications as of 04/24/2017  Medication Sig Note  . ALPRAZolam (XANAX) 0.5 MG tablet Take 0.5 mg by mouth 2 (two) times  daily as needed.   Marland Kitchen aspirin 81 MG chewable tablet Chew 81 mg by mouth daily.    Marland Kitchen atorvastatin (LIPITOR) 40 MG tablet Take 40 mg by mouth daily.   . carvedilol (COREG) 6.25 MG tablet Take 6.25 mg by mouth 2 (two) times daily with a meal. For BP greater than 100.   . cholecalciferol (VITAMIN D) 1000 units tablet Take 1,000 Units by mouth daily.   Marland Kitchen dexlansoprazole (DEXILANT) 60 MG capsule Take 60 mg by mouth daily.   . diclofenac sodium (VOLTAREN) 1 % GEL Apply topically 4 (four) times daily.   Marland Kitchen epoetin alfa (EPOGEN,PROCRIT) 44315 UNIT/ML injection Inject 20,000 Units into the skin. Every other week   . ezetimibe (ZETIA) 10 MG tablet Take 10 mg by mouth daily.   . ferrous sulfate 325 (65 FE) MG tablet Take 325 mg by mouth daily with breakfast.    . FLUoxetine (PROZAC) 20 MG tablet Take 20 mg by mouth daily.    . isosorbide mononitrate (IMDUR) 30 MG 24 hr tablet Take 30 mg by mouth 2 (two) times daily.   Marland Kitchen levothyroxine (SYNTHROID, LEVOTHROID) 75 MCG tablet Take 75 mcg by mouth daily before breakfast.   . nitroGLYCERIN (NITROSTAT) 0.4 MG SL tablet Place 0.4 mg under the tongue every 5 (five) minutes as needed.   Marland Kitchen oxyCODONE 30 MG 12 hr tablet Take 1 tablet by mouth 2 (two) times daily.   . Oxycodone HCl 10 MG TABS Take 1 tablet by mouth 2 (two) times daily as  needed.   . predniSONE (DELTASONE) 10 MG tablet Take 10 mg by mouth daily with breakfast.   . QUEtiapine Fumarate (SEROQUEL XR) 150 MG 24 hr tablet Take 150 mg by mouth at bedtime.   . ramelteon (ROZEREM) 8 MG tablet Take 8 mg by mouth daily.   . ranitidine (ZANTAC) 150 MG capsule Take 150 mg by mouth at bedtime.   [name] (SENNA PLUS PO) Take 2 tablets by mouth 2 (two) times daily.   Marland Kitchen zolpidem (AMBIEN) 10 MG tablet Take 10 mg by mouth at bedtime as needed.   . [DISCONTINUED] chlorthalidone (HYGROTON) 25 MG tablet Take 25 mg by mouth daily.   . [DISCONTINUED] simvastatin (ZOCOR) 20 MG tablet Take 20 mg by mouth  daily. 04/05/2017: Patient taking per Dr Olivia Canter office    No facility-administered encounter medications on file as of 04/24/2017.     Functional Status:   In your present state of health, do you have any difficulty performing the following activities: 04/24/2017 01/04/2017  Hearing? N N  Vision? Y N  Difficulty concentrating or making decisions? N N  Walking or climbing stairs? Y Y  Dressing or bathing? N N  Doing errands, shopping? N N  Preparing Food and eating ? N Y  Using the Toilet? N Y  In the past six months, have you accidently leaked urine? Y Y  Do you have problems with loss of bowel control? N N  Managing your Medications? N N  Managing your Finances? N N  Housekeeping or managing your Housekeeping? N Y  Some recent data might be hidden    Fall/Depression Screening:    Fall Risk  04/24/2017 01/04/2017 12/28/2016  Falls in the past year? Yes Yes Yes  Number falls in past yr: 2 or more 2 or more 2 or more  Injury with Fall? Yes Yes Yes  Risk Factor Category  High Fall Risk High Fall Risk High Fall Risk  Risk for fall due to : - Impaired mobility History of fall(s)  Follow up Falls evaluation completed;Falls prevention discussed Falls prevention discussed Follow up appointment  Comment - - patients primary MD appointment attended by [facility]. Primary MD discussed falls with patient.    PHQ 2/9 Scores 04/24/2017 01/04/2017 12/28/2016  PHQ - 2 Score 0 2 3  PHQ- 9 Score - 3 10    Assessment:   (1) reviewed THN transiton of care program.   (2) recent left hip dislocations after fall.  Currently using safety precautions. Using walker as directed.   (3) recent pneumonia.  Finishing medications. Denies any shortness of breath.  Productive cough.  (4) follow up with primary MD completed on 04/23/2017 Plan:  (1) next outreach in 1 week.  Encouraged patient to call sooner if needed. (2) encouraged patient to continue to observe safety precautions. Using elevated toliet seat, walker, no  bending over or stooping.  Reviewed importance of wearing hip brace.  (3) encouraged patient to finish her antibiotics and call MD for any changes in condition.  (4)  Encouraged to continue to observe for changes in condition.  Will send update letter and this note to MD.   [facility] CM Care Plan Problem One     Most Recent Value  Care Plan Problem One  Recent discharge from [facility] for pneumonia and weakness  Role Documenting the Problem One  Care Management [place] for Problem One  Active  [facility] Long Term Goal   Patient will report  self managing well at home unassisted in the next 60 days.   THN Long Term Goal Start Date  04/19/17 [date restarted. ]  Interventions for Problem One Long Term Goal  Home visit completed. Reviewed safety precautions.   THN CM Short Term Goal #1   Patient will report follow up with primary MD in the next 10 days  THN CM Short Term Goal #1 Start Date  02/27/17  [facility] CM Short Term Goal #1 Met Date  03/15/17  Interventions for Short Term Goal #1  Reviewed importance of timely follow up with MD. Encouraged patient to make and appointment and if she has problems to let me know.   THN CM Short Term Goal #2   Patient will report improved mobility in the next 7 days.   THN CM Short Term Goal #2 Start Date  03/06/17  [facility] CM Short Term Goal #2 Met Date  03/15/17  Interventions for Short Term Goal #2  Encouraged patient to stay active as she possibly can.   THN CM Short Term Goal #3  Patient will report follow up with MD in 7 days.   THN CM Short Term Goal #3 Start Date  04/19/17  [facility] CM Short Term Goal #3 Met Date  04/24/17  Interventions for Short Tern Goal #3  Encouraged patient to attend MD appointment.       Tomasa Rand, RN, BSN, CEN [facility] [employer] [phone]

## 2017-04-27 DIAGNOSIS — T84021D Dislocation of internal left hip prosthesis, subsequent encounter: Secondary | ICD-10-CM | POA: Diagnosis not present

## 2017-04-27 DIAGNOSIS — Z7952 Long term (current) use of systemic steroids: Secondary | ICD-10-CM | POA: Diagnosis not present

## 2017-04-27 DIAGNOSIS — J189 Pneumonia, unspecified organism: Secondary | ICD-10-CM | POA: Diagnosis not present

## 2017-04-27 DIAGNOSIS — M329 Systemic lupus erythematosus, unspecified: Secondary | ICD-10-CM | POA: Diagnosis not present

## 2017-04-27 DIAGNOSIS — N184 Chronic kidney disease, stage 4 (severe): Secondary | ICD-10-CM | POA: Diagnosis not present

## 2017-04-27 DIAGNOSIS — M87051 Idiopathic aseptic necrosis of right femur: Secondary | ICD-10-CM | POA: Diagnosis not present

## 2017-04-27 DIAGNOSIS — I739 Peripheral vascular disease, unspecified: Secondary | ICD-10-CM | POA: Diagnosis not present

## 2017-04-27 DIAGNOSIS — I129 Hypertensive chronic kidney disease with stage 1 through stage 4 chronic kidney disease, or unspecified chronic kidney disease: Secondary | ICD-10-CM | POA: Diagnosis not present

## 2017-04-27 DIAGNOSIS — I2510 Atherosclerotic heart disease of native coronary artery without angina pectoris: Secondary | ICD-10-CM | POA: Diagnosis not present

## 2017-04-27 DIAGNOSIS — Z9181 History of falling: Secondary | ICD-10-CM | POA: Diagnosis not present

## 2017-04-27 DIAGNOSIS — Z951 Presence of aortocoronary bypass graft: Secondary | ICD-10-CM | POA: Diagnosis not present

## 2017-05-01 ENCOUNTER — Other Ambulatory Visit: Payer: Self-pay

## 2017-05-01 NOTE — Patient Outreach (Signed)
Transition of care:  Placed call to patient who reports that she is having some hip pain or weakness.  States she physical therapist is coming today. Reports that she continues to wear brace.   Denies any other concerns today.  Plan: will follow up in 1 week. Encouraged patient to call sooner if needed.  Tomasa Rand, RN, BSN, CEN [facility] [employer] [phone]

## 2017-05-02 ENCOUNTER — Other Ambulatory Visit: Payer: Self-pay | Admitting: Pharmacist

## 2017-05-02 ENCOUNTER — Other Ambulatory Visit: Payer: Self-pay | Admitting: *Deleted

## 2017-05-02 NOTE — Patient Outreach (Addendum)
[facility]) Care Management  [place]   05/02/2017  Ellicia Alix 13-Feb-1961 [id number]  Subjective: Patient's chart was reviewed per referral from [facility] regarding opioid usage.  Patient is a 57 year old female with multiple medical conditions including but not limited to: CKD stage 4, GERD, hypothyroidsim, allergic rhinitis, depression, anxiety, hip dislocation status post replacement surgery, chronic knee pain status post total left knee replacement, hypertension, lupus, hyperlipidemia and limited mobility.  Patient was [facility] at [facility]) for encephalopathy, aspiration pneumonia, AKI and a dislocated hip.  She underwent closed hip dislocation surgery while hospitalized.   Patient appears to use [facility] in [place] for the majority of her medications but did use [facility] [address] in [place] in December. [facility] reports she has not had medications filled since then at their location.  Objective:   Encounter Medications: Outpatient Encounter Medications as of 05/02/2017  Medication Sig  . ALPRAZolam (XANAX) 0.5 MG tablet Take 0.5 mg by mouth 2 (two) times daily as needed.  Marland Kitchen aspirin 81 MG chewable tablet Chew 81 mg by mouth daily.   Marland Kitchen atorvastatin (LIPITOR) 40 MG tablet Take 40 mg by mouth daily.  . carvedilol (COREG) 6.25 MG tablet Take 6.25 mg by mouth 2 (two) times daily with a meal. For BP greater than 100.  . cholecalciferol (VITAMIN D) 1000 units tablet Take 1,000 Units by mouth daily.  Marland Kitchen dexlansoprazole (DEXILANT) 60 MG capsule Take 60 mg by mouth daily.  . diclofenac sodium (VOLTAREN) 1 % GEL Apply topically 4 (four) times daily.  Marland Kitchen epoetin alfa (EPOGEN,PROCRIT) 46270 UNIT/ML injection Inject 20,000 Units into the skin. Every other week  . ezetimibe (ZETIA) 10 MG tablet Take 10 mg by mouth daily.  . ferrous sulfate 325 (65 FE) MG tablet Take 325 mg by mouth daily with breakfast.   .  FLUoxetine (PROZAC) 20 MG tablet Take 20 mg by mouth daily.   . isosorbide mononitrate (IMDUR) 30 MG 24 hr tablet Take 30 mg by mouth 2 (two) times daily.  Marland Kitchen levothyroxine (SYNTHROID, LEVOTHROID) 75 MCG tablet Take 75 mcg by mouth daily before breakfast.  . nitroGLYCERIN (NITROSTAT) 0.4 MG SL tablet Place 0.4 mg under the tongue every 5 (five) minutes as needed.  Marland Kitchen oxyCODONE 30 MG 12 hr tablet Take 1 tablet by mouth 2 (two) times daily.  . Oxycodone HCl 10 MG TABS Take 1 tablet by mouth 2 (two) times daily as needed.  . predniSONE (DELTASONE) 10 MG tablet Take 10 mg by mouth daily with breakfast.  . QUEtiapine Fumarate (SEROQUEL XR) 150 MG 24 hr tablet Take 150 mg by mouth at bedtime.  . ramelteon (ROZEREM) 8 MG tablet Take 8 mg by mouth daily.  . ranitidine (ZANTAC) 150 MG capsule Take 150 mg by mouth at bedtime.  [name] (SENNA PLUS PO) Take 2 tablets by mouth 2 (two) times daily.  Marland Kitchen zolpidem (AMBIEN) 10 MG tablet Take 10 mg by mouth at bedtime as needed.   No facility-administered encounter medications on file as of 05/02/2017.     Functional Status: In your present state of health, do you have any difficulty performing the following activities: 04/24/2017 01/04/2017  Hearing? N N  Vision? Y N  Difficulty concentrating or making decisions? N N  Walking or climbing stairs? Y Y  Dressing or bathing? N N  Doing errands, shopping? N N  Preparing Food and eating ? N Y  Using  the Toilet? N Y  In the past six months, have you accidently leaked urine? Y Y  Do you have problems with loss of bowel control? N N  Managing your Medications? N N  Managing your Finances? N N  Housekeeping or managing your Housekeeping? N Y  Some recent data might be hidden    Fall/Depression Screening: Fall Risk  04/24/2017 01/04/2017 12/28/2016  Falls in the past year? Yes Yes Yes  Number falls in past yr: 2 or more 2 or more 2 or more  Injury with Fall? Yes Yes Yes  Risk Factor Category   High Fall Risk High Fall Risk High Fall Risk  Risk for fall due to : - Impaired mobility History of fall(s)  Follow up Falls evaluation completed;Falls prevention discussed Falls prevention discussed Follow up appointment  Comment - - patients primary MD appointment attended by [facility]. Primary MD discussed falls with patient.    PHQ 2/9 Scores 04/24/2017 01/04/2017 12/28/2016  PHQ - 2 Score 0 2 3  PHQ- 9 Score - 3 10      Assessment:  Patient was hospitalized at [facility] 04/12/17-04/17/17  While hospitalized she was found to have: - a dislocated hip for which she underwent surgery.  -Aspiration Pneumonia-was prescribed antibiotics  -AKI- was rehydrated  -Acute Encephalopathy-noted to have been caused my multiple etiologies-medications, dehydration, AKI etc.--this was documented on the discharge as having "resolved on its own" as patient was back to baseline at discharge.   According to the discharge summary from [facility] on 04/17/17, Oxycodone ER 30mg , atorvastatin, fluoxetine, amlodipine, chlorthalidone, ferrous sulfate and nitroglycerin were discontinued.    The discharge summary from [facility] also says the only oxycodone product the patient was on was Oxycodone IR 5mg .    Fluoxetine, oxycodone IR 10 mg and Oxycodone ER 30mg , atorvastatin  are still on the medication list. [facility] Pharmacist, [name] completed a medication reconciliation on this patient at the beginning of January and commented about fluoxetine and atorvastatin and received confirmation from Dr. Alyse Low office that these medications were still active.  Provider was made aware of the potential for CNS depression on 03/26/17 by [facility] Pharmacist, [name].  Lennette Bihari called or spoke with the patient's PCP > 3 times in January.  Patient's pharmacy case has been closed but the patient is currently being managed by [facility] Nurse Case Manager, Driscilla Grammes.  Both [place] and [facility] were called the following fill information was obtained:    [place]: Oxycodone 10mg -01/29/17 #130 Dr. Alfredo Bach, Oxycodone 10mg -  04/30/17 -Dr. Rochel Brome Oxycodone ER 30mg 01/29/17, 04/30/17 #60-Dr. Rochel Brome  Alprazolam 0.5mg -03/06/17, 04/19/17 #60-Dr. Rochel Brome  Zolpidem 10mg -02/02/17, 03/26/17, 04/30/17 #30-Dr [facility] Fills: Oxycodone 10mg -#90 Dr. Rochel Brome Oxycodone ER 30mg -#60-Dr. Rochel Brome   Patient saw Dr. Starlyn Skeans on 04/18/17-Oxycodone IRmg was listed on the patient's medication list.  (It does not look like Oxycodone IR 5mg  was ever filled)   Plan: Report findings to leadership.   Elayne Guerin, PharmD, [place] Clinical Pharmacist [phone]

## 2017-05-03 ENCOUNTER — Ambulatory Visit: Payer: Self-pay

## 2017-05-04 DIAGNOSIS — N184 Chronic kidney disease, stage 4 (severe): Secondary | ICD-10-CM | POA: Diagnosis not present

## 2017-05-04 DIAGNOSIS — I10 Essential (primary) hypertension: Secondary | ICD-10-CM | POA: Diagnosis not present

## 2017-05-04 DIAGNOSIS — D631 Anemia in chronic kidney disease: Secondary | ICD-10-CM | POA: Diagnosis not present

## 2017-05-04 DIAGNOSIS — N390 Urinary tract infection, site not specified: Secondary | ICD-10-CM | POA: Diagnosis not present

## 2017-05-08 ENCOUNTER — Other Ambulatory Visit: Payer: Self-pay

## 2017-05-08 NOTE — Patient Outreach (Signed)
Transition of care: Incoming call from patient trying to reach her PT because therapist was over an hour late.  Patient reports that she has called and left messages. I provided company contact number for patient for [place].   Patient reports that she is wearing her brace and she has been having pain in her hip. She thinks this is because of the weather.    Denies any new concerns today. Reports she is worried about her father who has dementia.   PLAN: Will follow up with patient in 1 week. Tomasa Rand, RN, BSN, CEN [facility] [employer] [phone]

## 2017-05-09 ENCOUNTER — Other Ambulatory Visit: Payer: Self-pay

## 2017-05-09 NOTE — Patient Outreach (Signed)
Transition of care: Placed follow up call to inquire about pain management. Patient report that she uses her long acting pain medications and her short acting ( breakthrough) medications as needed. Reports that her primary MD fills her pain medications prescriptions. Patient currently feels like her pain is managed well.   Tomasa Rand, RN, BSN, CEN [facility] [employer] [phone]

## 2017-05-09 NOTE — Patient Outreach (Signed)
Care coordination: Placed call to patient to follow up on pain maintenance. Left a message to return call,   Tomasa Rand, RN, BSN, [facility] [employer] [phone]

## 2017-05-15 ENCOUNTER — Other Ambulatory Visit: Payer: Self-pay

## 2017-05-15 ENCOUNTER — Ambulatory Visit: Payer: Self-pay

## 2017-05-15 NOTE — Patient Outreach (Signed)
Transition of care: Placed call to patient for weekly follow up. Patient reports that she is doing well. Reports that she continues to have pain in her hip but everything is okay. Denies any recent fall. Reports she continues to have therapy.  Reports no new issues or concerns today.  PLAN: final transition of care call in 1 week. Encouraged patient to call me sooner if needed.  Tomasa Rand, RN, BSN, CEN [facility] [employer] [phone]

## 2017-05-19 DIAGNOSIS — T8454XD Infection and inflammatory reaction due to internal left knee prosthesis, subsequent encounter: Secondary | ICD-10-CM | POA: Diagnosis not present

## 2017-05-19 DIAGNOSIS — Z96652 Presence of left artificial knee joint: Secondary | ICD-10-CM | POA: Diagnosis not present

## 2017-05-21 DIAGNOSIS — N185 Chronic kidney disease, stage 5: Secondary | ICD-10-CM | POA: Diagnosis not present

## 2017-05-21 DIAGNOSIS — E559 Vitamin D deficiency, unspecified: Secondary | ICD-10-CM | POA: Diagnosis not present

## 2017-05-21 DIAGNOSIS — R7301 Impaired fasting glucose: Secondary | ICD-10-CM | POA: Diagnosis not present

## 2017-05-21 DIAGNOSIS — M3214 Glomerular disease in systemic lupus erythematosus: Secondary | ICD-10-CM | POA: Diagnosis not present

## 2017-05-21 DIAGNOSIS — E782 Mixed hyperlipidemia: Secondary | ICD-10-CM | POA: Diagnosis not present

## 2017-05-21 DIAGNOSIS — I119 Hypertensive heart disease without heart failure: Secondary | ICD-10-CM | POA: Diagnosis not present

## 2017-05-21 DIAGNOSIS — M810 Age-related osteoporosis without current pathological fracture: Secondary | ICD-10-CM | POA: Diagnosis not present

## 2017-05-21 DIAGNOSIS — E038 Other specified hypothyroidism: Secondary | ICD-10-CM | POA: Diagnosis not present

## 2017-05-22 ENCOUNTER — Other Ambulatory Visit: Payer: Self-pay

## 2017-05-22 DIAGNOSIS — Z7952 Long term (current) use of systemic steroids: Secondary | ICD-10-CM | POA: Diagnosis not present

## 2017-05-22 DIAGNOSIS — I2510 Atherosclerotic heart disease of native coronary artery without angina pectoris: Secondary | ICD-10-CM | POA: Diagnosis not present

## 2017-05-22 DIAGNOSIS — I129 Hypertensive chronic kidney disease with stage 1 through stage 4 chronic kidney disease, or unspecified chronic kidney disease: Secondary | ICD-10-CM | POA: Diagnosis not present

## 2017-05-22 DIAGNOSIS — I739 Peripheral vascular disease, unspecified: Secondary | ICD-10-CM | POA: Diagnosis not present

## 2017-05-22 DIAGNOSIS — N184 Chronic kidney disease, stage 4 (severe): Secondary | ICD-10-CM | POA: Diagnosis not present

## 2017-05-22 DIAGNOSIS — M87051 Idiopathic aseptic necrosis of right femur: Secondary | ICD-10-CM | POA: Diagnosis not present

## 2017-05-22 DIAGNOSIS — Z9181 History of falling: Secondary | ICD-10-CM | POA: Diagnosis not present

## 2017-05-22 DIAGNOSIS — M329 Systemic lupus erythematosus, unspecified: Secondary | ICD-10-CM | POA: Diagnosis not present

## 2017-05-22 DIAGNOSIS — T84021D Dislocation of internal left hip prosthesis, subsequent encounter: Secondary | ICD-10-CM | POA: Diagnosis not present

## 2017-05-22 DIAGNOSIS — Z951 Presence of aortocoronary bypass graft: Secondary | ICD-10-CM | POA: Diagnosis not present

## 2017-05-22 DIAGNOSIS — J189 Pneumonia, unspecified organism: Secondary | ICD-10-CM | POA: Diagnosis not present

## 2017-05-22 NOTE — Patient Outreach (Signed)
Transition of care: Placed call to patient. No answer. Left a message requesting a call back.  PLAN: will attempt contact again in 1 day.  Tomasa Rand, RN, BSN, CEN [facility] [employer] [phone]

## 2017-05-23 ENCOUNTER — Other Ambulatory Visit: Payer: Self-pay

## 2017-05-23 NOTE — Patient Outreach (Signed)
Transition of care: Incoming call from patient who reports that she is doing well. Reports a fall while trying to take care of her sick dog 4 days ago.  Reports she has a bruise on her chin.  Reports no other injury. States she was wearing her hip brace and hip did not pop out.   PLAN: reviewed with patient that she has completed transition of care program successfully.  Will plan follow up call in 3 weeks. Encouraged patient to call sooner if needed. She agreed.  Tomasa Rand, RN, BSN, CEN [facility] [employer] [phone]

## 2017-05-30 DIAGNOSIS — Z96642 Presence of left artificial hip joint: Secondary | ICD-10-CM | POA: Diagnosis not present

## 2017-05-30 DIAGNOSIS — Z471 Aftercare following joint replacement surgery: Secondary | ICD-10-CM | POA: Diagnosis not present

## 2017-05-30 DIAGNOSIS — M533 Sacrococcygeal disorders, not elsewhere classified: Secondary | ICD-10-CM | POA: Diagnosis not present

## 2017-05-30 DIAGNOSIS — M85852 Other specified disorders of bone density and structure, left thigh: Secondary | ICD-10-CM | POA: Diagnosis not present

## 2017-06-02 DIAGNOSIS — R404 Transient alteration of awareness: Secondary | ICD-10-CM | POA: Diagnosis not present

## 2017-06-02 DIAGNOSIS — R531 Weakness: Secondary | ICD-10-CM | POA: Diagnosis not present

## 2017-06-02 DIAGNOSIS — M79605 Pain in left leg: Secondary | ICD-10-CM | POA: Diagnosis not present

## 2017-06-02 DIAGNOSIS — S79912A Unspecified injury of left hip, initial encounter: Secondary | ICD-10-CM | POA: Diagnosis not present

## 2017-06-02 DIAGNOSIS — Z96642 Presence of left artificial hip joint: Secondary | ICD-10-CM | POA: Diagnosis not present

## 2017-06-02 DIAGNOSIS — M25552 Pain in left hip: Secondary | ICD-10-CM | POA: Diagnosis not present

## 2017-06-05 DIAGNOSIS — M25562 Pain in left knee: Secondary | ICD-10-CM | POA: Diagnosis not present

## 2017-06-05 DIAGNOSIS — M549 Dorsalgia, unspecified: Secondary | ICD-10-CM | POA: Diagnosis not present

## 2017-06-05 DIAGNOSIS — B028 Zoster with other complications: Secondary | ICD-10-CM | POA: Diagnosis not present

## 2017-06-05 DIAGNOSIS — M25552 Pain in left hip: Secondary | ICD-10-CM | POA: Diagnosis not present

## 2017-06-05 DIAGNOSIS — M545 Low back pain: Secondary | ICD-10-CM | POA: Diagnosis not present

## 2017-06-05 DIAGNOSIS — S3992XA Unspecified injury of lower back, initial encounter: Secondary | ICD-10-CM | POA: Diagnosis not present

## 2017-06-12 DIAGNOSIS — N184 Chronic kidney disease, stage 4 (severe): Secondary | ICD-10-CM | POA: Diagnosis not present

## 2017-06-12 DIAGNOSIS — R809 Proteinuria, unspecified: Secondary | ICD-10-CM | POA: Diagnosis not present

## 2017-06-12 DIAGNOSIS — I10 Essential (primary) hypertension: Secondary | ICD-10-CM | POA: Diagnosis not present

## 2017-06-13 ENCOUNTER — Other Ambulatory Visit: Payer: Self-pay

## 2017-06-13 DIAGNOSIS — R011 Cardiac murmur, unspecified: Secondary | ICD-10-CM | POA: Diagnosis not present

## 2017-06-13 DIAGNOSIS — R109 Unspecified abdominal pain: Secondary | ICD-10-CM | POA: Diagnosis not present

## 2017-06-13 DIAGNOSIS — E875 Hyperkalemia: Secondary | ICD-10-CM | POA: Diagnosis not present

## 2017-06-13 DIAGNOSIS — R197 Diarrhea, unspecified: Secondary | ICD-10-CM | POA: Diagnosis not present

## 2017-06-13 DIAGNOSIS — N189 Chronic kidney disease, unspecified: Secondary | ICD-10-CM | POA: Diagnosis not present

## 2017-06-13 DIAGNOSIS — R51 Headache: Secondary | ICD-10-CM | POA: Diagnosis not present

## 2017-06-13 NOTE — Patient Outreach (Signed)
60 day follow up call:  Attempted to reach patient. Unsuccessful. Left a message and requested a call back.  Plan: will attempt again in 24 hours,  Tomasa Rand, Therapist, sports, BSN, [facility] [employer] [phone]

## 2017-06-14 ENCOUNTER — Other Ambulatory Visit: Payer: Self-pay

## 2017-06-15 NOTE — Patient Outreach (Signed)
Telephone follow up/ case closure:  Placed call to patient who answers and reports that she is doing well. Reports that she has had an elevated potassium level and has recent treatment for that. Reports she saw primary MD this week and had labs drawn. Went to nephrologist who sent her to ED and then she was discharged home with encouragement to stay hydrated.   Reports that she continues to have hip pain.  Reports she is wearing her brace on and off. Using her walker all the time. Reports she has better balance with using her walker without her brace.   Reports she has all her medications and is doing well. We discussed goals and case closure and she feels like she has met her goals and has no other goals to work on at this time.  Will plan to close case. Patient in agreement.  PLAN: case closure with goals met. Will send MD case closure letter and patient case closure letter. Encouraged patient to use the 24 hour nurse line if needed in the future. She agreed.  Tomasa Rand, RN, BSN, CEN [facility] [employer] [phone]

## 2017-06-17 DIAGNOSIS — N179 Acute kidney failure, unspecified: Secondary | ICD-10-CM | POA: Diagnosis not present

## 2017-06-17 DIAGNOSIS — I129 Hypertensive chronic kidney disease with stage 1 through stage 4 chronic kidney disease, or unspecified chronic kidney disease: Secondary | ICD-10-CM | POA: Diagnosis not present

## 2017-06-17 DIAGNOSIS — M329 Systemic lupus erythematosus, unspecified: Secondary | ICD-10-CM | POA: Diagnosis not present

## 2017-06-17 DIAGNOSIS — R509 Fever, unspecified: Secondary | ICD-10-CM | POA: Diagnosis not present

## 2017-06-17 DIAGNOSIS — R930 Abnormal findings on diagnostic imaging of skull and head, not elsewhere classified: Secondary | ICD-10-CM | POA: Diagnosis not present

## 2017-06-17 DIAGNOSIS — Z8673 Personal history of transient ischemic attack (TIA), and cerebral infarction without residual deficits: Secondary | ICD-10-CM | POA: Diagnosis not present

## 2017-06-17 DIAGNOSIS — J189 Pneumonia, unspecified organism: Secondary | ICD-10-CM | POA: Diagnosis not present

## 2017-06-17 DIAGNOSIS — R109 Unspecified abdominal pain: Secondary | ICD-10-CM | POA: Diagnosis not present

## 2017-06-17 DIAGNOSIS — K222 Esophageal obstruction: Secondary | ICD-10-CM | POA: Diagnosis not present

## 2017-06-17 DIAGNOSIS — N184 Chronic kidney disease, stage 4 (severe): Secondary | ICD-10-CM | POA: Diagnosis not present

## 2017-06-17 DIAGNOSIS — K219 Gastro-esophageal reflux disease without esophagitis: Secondary | ICD-10-CM | POA: Diagnosis not present

## 2017-06-17 DIAGNOSIS — Z7982 Long term (current) use of aspirin: Secondary | ICD-10-CM | POA: Diagnosis not present

## 2017-06-17 DIAGNOSIS — J9601 Acute respiratory failure with hypoxia: Secondary | ICD-10-CM | POA: Diagnosis not present

## 2017-06-17 DIAGNOSIS — K828 Other specified diseases of gallbladder: Secondary | ICD-10-CM | POA: Diagnosis not present

## 2017-06-17 DIAGNOSIS — R000 Tachycardia, unspecified: Secondary | ICD-10-CM | POA: Diagnosis not present

## 2017-06-17 DIAGNOSIS — E875 Hyperkalemia: Secondary | ICD-10-CM | POA: Diagnosis not present

## 2017-06-17 DIAGNOSIS — D631 Anemia in chronic kidney disease: Secondary | ICD-10-CM | POA: Diagnosis not present

## 2017-06-17 DIAGNOSIS — I252 Old myocardial infarction: Secondary | ICD-10-CM | POA: Diagnosis not present

## 2017-06-17 DIAGNOSIS — T8454XD Infection and inflammatory reaction due to internal left knee prosthesis, subsequent encounter: Secondary | ICD-10-CM | POA: Diagnosis not present

## 2017-06-17 DIAGNOSIS — N189 Chronic kidney disease, unspecified: Secondary | ICD-10-CM | POA: Diagnosis not present

## 2017-06-17 DIAGNOSIS — I2510 Atherosclerotic heart disease of native coronary artery without angina pectoris: Secondary | ICD-10-CM | POA: Diagnosis not present

## 2017-06-17 DIAGNOSIS — I639 Cerebral infarction, unspecified: Secondary | ICD-10-CM | POA: Diagnosis not present

## 2017-06-17 DIAGNOSIS — I10 Essential (primary) hypertension: Secondary | ICD-10-CM | POA: Diagnosis not present

## 2017-06-17 DIAGNOSIS — A419 Sepsis, unspecified organism: Secondary | ICD-10-CM | POA: Diagnosis not present

## 2017-06-17 DIAGNOSIS — Z452 Encounter for adjustment and management of vascular access device: Secondary | ICD-10-CM | POA: Diagnosis not present

## 2017-06-17 DIAGNOSIS — R0602 Shortness of breath: Secondary | ICD-10-CM | POA: Diagnosis not present

## 2017-06-17 DIAGNOSIS — R918 Other nonspecific abnormal finding of lung field: Secondary | ICD-10-CM | POA: Diagnosis not present

## 2017-06-17 DIAGNOSIS — Z79891 Long term (current) use of opiate analgesic: Secondary | ICD-10-CM | POA: Diagnosis not present

## 2017-06-17 DIAGNOSIS — E785 Hyperlipidemia, unspecified: Secondary | ICD-10-CM | POA: Diagnosis not present

## 2017-06-17 DIAGNOSIS — R001 Bradycardia, unspecified: Secondary | ICD-10-CM | POA: Diagnosis not present

## 2017-06-17 DIAGNOSIS — R5381 Other malaise: Secondary | ICD-10-CM | POA: Diagnosis not present

## 2017-06-17 DIAGNOSIS — E039 Hypothyroidism, unspecified: Secondary | ICD-10-CM | POA: Diagnosis not present

## 2017-06-17 DIAGNOSIS — J019 Acute sinusitis, unspecified: Secondary | ICD-10-CM | POA: Diagnosis not present

## 2017-06-17 DIAGNOSIS — N289 Disorder of kidney and ureter, unspecified: Secondary | ICD-10-CM | POA: Diagnosis not present

## 2017-06-17 DIAGNOSIS — Z96641 Presence of right artificial hip joint: Secondary | ICD-10-CM | POA: Diagnosis not present

## 2017-06-17 DIAGNOSIS — R4182 Altered mental status, unspecified: Secondary | ICD-10-CM | POA: Diagnosis not present

## 2017-06-17 DIAGNOSIS — Z96652 Presence of left artificial knee joint: Secondary | ICD-10-CM | POA: Diagnosis not present

## 2017-06-17 DIAGNOSIS — K862 Cyst of pancreas: Secondary | ICD-10-CM | POA: Diagnosis not present

## 2017-06-17 DIAGNOSIS — J181 Lobar pneumonia, unspecified organism: Secondary | ICD-10-CM | POA: Diagnosis not present

## 2017-06-18 DIAGNOSIS — R4182 Altered mental status, unspecified: Secondary | ICD-10-CM | POA: Diagnosis not present

## 2017-06-18 DIAGNOSIS — A419 Sepsis, unspecified organism: Secondary | ICD-10-CM | POA: Diagnosis not present

## 2017-06-18 DIAGNOSIS — N289 Disorder of kidney and ureter, unspecified: Secondary | ICD-10-CM | POA: Diagnosis not present

## 2017-06-18 DIAGNOSIS — N189 Chronic kidney disease, unspecified: Secondary | ICD-10-CM | POA: Diagnosis not present

## 2017-06-18 DIAGNOSIS — N184 Chronic kidney disease, stage 4 (severe): Secondary | ICD-10-CM | POA: Diagnosis not present

## 2017-06-18 DIAGNOSIS — E039 Hypothyroidism, unspecified: Secondary | ICD-10-CM | POA: Diagnosis not present

## 2017-06-18 DIAGNOSIS — I2510 Atherosclerotic heart disease of native coronary artery without angina pectoris: Secondary | ICD-10-CM | POA: Diagnosis not present

## 2017-06-18 DIAGNOSIS — I10 Essential (primary) hypertension: Secondary | ICD-10-CM | POA: Diagnosis not present

## 2017-06-18 DIAGNOSIS — J189 Pneumonia, unspecified organism: Secondary | ICD-10-CM | POA: Diagnosis not present

## 2017-06-18 DIAGNOSIS — E785 Hyperlipidemia, unspecified: Secondary | ICD-10-CM | POA: Diagnosis not present

## 2017-06-21 DIAGNOSIS — M1991 Primary osteoarthritis, unspecified site: Secondary | ICD-10-CM | POA: Diagnosis not present

## 2017-06-21 DIAGNOSIS — I739 Peripheral vascular disease, unspecified: Secondary | ICD-10-CM | POA: Diagnosis not present

## 2017-06-21 DIAGNOSIS — Z7409 Other reduced mobility: Secondary | ICD-10-CM | POA: Diagnosis not present

## 2017-06-21 DIAGNOSIS — E079 Disorder of thyroid, unspecified: Secondary | ICD-10-CM | POA: Diagnosis not present

## 2017-06-21 DIAGNOSIS — I252 Old myocardial infarction: Secondary | ICD-10-CM | POA: Diagnosis not present

## 2017-06-21 DIAGNOSIS — Z951 Presence of aortocoronary bypass graft: Secondary | ICD-10-CM | POA: Diagnosis not present

## 2017-06-21 DIAGNOSIS — I509 Heart failure, unspecified: Secondary | ICD-10-CM | POA: Diagnosis not present

## 2017-06-21 DIAGNOSIS — Z96652 Presence of left artificial knee joint: Secondary | ICD-10-CM | POA: Diagnosis not present

## 2017-06-21 DIAGNOSIS — I130 Hypertensive heart and chronic kidney disease with heart failure and stage 1 through stage 4 chronic kidney disease, or unspecified chronic kidney disease: Secondary | ICD-10-CM | POA: Diagnosis not present

## 2017-06-21 DIAGNOSIS — F419 Anxiety disorder, unspecified: Secondary | ICD-10-CM | POA: Diagnosis not present

## 2017-06-21 DIAGNOSIS — M329 Systemic lupus erythematosus, unspecified: Secondary | ICD-10-CM | POA: Diagnosis not present

## 2017-06-21 DIAGNOSIS — J189 Pneumonia, unspecified organism: Secondary | ICD-10-CM | POA: Diagnosis not present

## 2017-06-21 DIAGNOSIS — Z87891 Personal history of nicotine dependence: Secondary | ICD-10-CM | POA: Diagnosis not present

## 2017-06-21 DIAGNOSIS — M25552 Pain in left hip: Secondary | ICD-10-CM | POA: Diagnosis not present

## 2017-06-21 DIAGNOSIS — I7300 Raynaud's syndrome without gangrene: Secondary | ICD-10-CM | POA: Diagnosis not present

## 2017-06-21 DIAGNOSIS — Z96651 Presence of right artificial knee joint: Secondary | ICD-10-CM | POA: Diagnosis not present

## 2017-06-21 DIAGNOSIS — K219 Gastro-esophageal reflux disease without esophagitis: Secondary | ICD-10-CM | POA: Diagnosis not present

## 2017-06-21 DIAGNOSIS — N184 Chronic kidney disease, stage 4 (severe): Secondary | ICD-10-CM | POA: Diagnosis not present

## 2017-06-21 DIAGNOSIS — E039 Hypothyroidism, unspecified: Secondary | ICD-10-CM | POA: Diagnosis not present

## 2017-06-21 DIAGNOSIS — G4700 Insomnia, unspecified: Secondary | ICD-10-CM | POA: Diagnosis not present

## 2017-06-21 DIAGNOSIS — I2510 Atherosclerotic heart disease of native coronary artery without angina pectoris: Secondary | ICD-10-CM | POA: Diagnosis not present

## 2017-06-21 DIAGNOSIS — D638 Anemia in other chronic diseases classified elsewhere: Secondary | ICD-10-CM | POA: Diagnosis not present

## 2017-06-21 DIAGNOSIS — Z85528 Personal history of other malignant neoplasm of kidney: Secondary | ICD-10-CM | POA: Diagnosis not present

## 2017-06-21 DIAGNOSIS — Z96642 Presence of left artificial hip joint: Secondary | ICD-10-CM | POA: Diagnosis not present

## 2017-06-21 DIAGNOSIS — F339 Major depressive disorder, recurrent, unspecified: Secondary | ICD-10-CM | POA: Diagnosis not present

## 2017-06-25 ENCOUNTER — Other Ambulatory Visit: Payer: Self-pay

## 2017-06-25 NOTE — Patient Outreach (Signed)
[facility]) Care Management  06/25/2017  Crystal Clarke 20-Jan-1961 [id number]     Transition of Care Referral  Referral Date: 06/25/17 Referral Source: HTA Discharge Report Date of Admission: unknown Diagnosis: unknown Date of Discharge: 06/20/17 Facility: [place]: HTA    Referral received. No outreach warranted at this time. TOC will be completed by primary care provider office who will refer to [facility] care mgmt if needed.    Plan: RN CM will close case at this time.    Enzo Montgomery, RN,BSN,CCM [place] Management Telephonic Care Management Coordinator Direct Phone: [phone] Toll Free: [phone] Fax: [phone]

## 2017-06-27 DIAGNOSIS — J9601 Acute respiratory failure with hypoxia: Secondary | ICD-10-CM | POA: Diagnosis not present

## 2017-06-27 DIAGNOSIS — K210 Gastro-esophageal reflux disease with esophagitis: Secondary | ICD-10-CM | POA: Diagnosis not present

## 2017-06-27 DIAGNOSIS — N184 Chronic kidney disease, stage 4 (severe): Secondary | ICD-10-CM | POA: Diagnosis not present

## 2017-06-27 DIAGNOSIS — J168 Pneumonia due to other specified infectious organisms: Secondary | ICD-10-CM | POA: Diagnosis not present

## 2017-06-27 DIAGNOSIS — R6521 Severe sepsis with septic shock: Secondary | ICD-10-CM | POA: Diagnosis not present

## 2017-06-27 DIAGNOSIS — R197 Diarrhea, unspecified: Secondary | ICD-10-CM | POA: Diagnosis not present

## 2017-06-27 DIAGNOSIS — I110 Hypertensive heart disease with heart failure: Secondary | ICD-10-CM | POA: Diagnosis not present

## 2017-06-27 DIAGNOSIS — E038 Other specified hypothyroidism: Secondary | ICD-10-CM | POA: Diagnosis not present

## 2017-06-27 DIAGNOSIS — J018 Other acute sinusitis: Secondary | ICD-10-CM | POA: Diagnosis not present

## 2017-06-28 DIAGNOSIS — R197 Diarrhea, unspecified: Secondary | ICD-10-CM | POA: Diagnosis not present

## 2017-06-29 DIAGNOSIS — J168 Pneumonia due to other specified infectious organisms: Secondary | ICD-10-CM | POA: Diagnosis not present

## 2017-06-29 DIAGNOSIS — J189 Pneumonia, unspecified organism: Secondary | ICD-10-CM | POA: Diagnosis not present

## 2017-07-16 DIAGNOSIS — E7800 Pure hypercholesterolemia, unspecified: Secondary | ICD-10-CM | POA: Diagnosis not present

## 2017-07-16 DIAGNOSIS — I252 Old myocardial infarction: Secondary | ICD-10-CM | POA: Diagnosis not present

## 2017-07-16 DIAGNOSIS — R4182 Altered mental status, unspecified: Secondary | ICD-10-CM | POA: Diagnosis not present

## 2017-07-16 DIAGNOSIS — N179 Acute kidney failure, unspecified: Secondary | ICD-10-CM | POA: Diagnosis not present

## 2017-07-16 DIAGNOSIS — N184 Chronic kidney disease, stage 4 (severe): Secondary | ICD-10-CM | POA: Diagnosis not present

## 2017-07-16 DIAGNOSIS — I214 Non-ST elevation (NSTEMI) myocardial infarction: Secondary | ICD-10-CM | POA: Diagnosis not present

## 2017-07-16 DIAGNOSIS — I129 Hypertensive chronic kidney disease with stage 1 through stage 4 chronic kidney disease, or unspecified chronic kidney disease: Secondary | ICD-10-CM | POA: Diagnosis not present

## 2017-07-16 DIAGNOSIS — I25810 Atherosclerosis of coronary artery bypass graft(s) without angina pectoris: Secondary | ICD-10-CM | POA: Diagnosis not present

## 2017-07-16 DIAGNOSIS — F17210 Nicotine dependence, cigarettes, uncomplicated: Secondary | ICD-10-CM | POA: Diagnosis not present

## 2017-07-16 DIAGNOSIS — R918 Other nonspecific abnormal finding of lung field: Secondary | ICD-10-CM | POA: Diagnosis not present

## 2017-07-16 DIAGNOSIS — R300 Dysuria: Secondary | ICD-10-CM | POA: Diagnosis not present

## 2017-07-17 DIAGNOSIS — D631 Anemia in chronic kidney disease: Secondary | ICD-10-CM | POA: Diagnosis not present

## 2017-07-17 DIAGNOSIS — R279 Unspecified lack of coordination: Secondary | ICD-10-CM | POA: Diagnosis not present

## 2017-07-17 DIAGNOSIS — E872 Acidosis: Secondary | ICD-10-CM | POA: Diagnosis not present

## 2017-07-17 DIAGNOSIS — N179 Acute kidney failure, unspecified: Secondary | ICD-10-CM | POA: Diagnosis not present

## 2017-07-17 DIAGNOSIS — T8454XD Infection and inflammatory reaction due to internal left knee prosthesis, subsequent encounter: Secondary | ICD-10-CM | POA: Diagnosis not present

## 2017-07-17 DIAGNOSIS — M898X5 Other specified disorders of bone, thigh: Secondary | ICD-10-CM | POA: Diagnosis not present

## 2017-07-17 DIAGNOSIS — Z87891 Personal history of nicotine dependence: Secondary | ICD-10-CM | POA: Diagnosis not present

## 2017-07-17 DIAGNOSIS — R079 Chest pain, unspecified: Secondary | ICD-10-CM | POA: Diagnosis not present

## 2017-07-17 DIAGNOSIS — I214 Non-ST elevation (NSTEMI) myocardial infarction: Secondary | ICD-10-CM | POA: Diagnosis not present

## 2017-07-17 DIAGNOSIS — Z66 Do not resuscitate: Secondary | ICD-10-CM | POA: Diagnosis not present

## 2017-07-17 DIAGNOSIS — S32810D Multiple fractures of pelvis with stable disruption of pelvic ring, subsequent encounter for fracture with routine healing: Secondary | ICD-10-CM | POA: Diagnosis not present

## 2017-07-17 DIAGNOSIS — M3214 Glomerular disease in systemic lupus erythematosus: Secondary | ICD-10-CM | POA: Diagnosis not present

## 2017-07-17 DIAGNOSIS — R2689 Other abnormalities of gait and mobility: Secondary | ICD-10-CM | POA: Diagnosis not present

## 2017-07-17 DIAGNOSIS — Z951 Presence of aortocoronary bypass graft: Secondary | ICD-10-CM | POA: Diagnosis not present

## 2017-07-17 DIAGNOSIS — M6281 Muscle weakness (generalized): Secondary | ICD-10-CM | POA: Diagnosis not present

## 2017-07-17 DIAGNOSIS — I10 Essential (primary) hypertension: Secondary | ICD-10-CM | POA: Diagnosis not present

## 2017-07-17 DIAGNOSIS — N186 End stage renal disease: Secondary | ICD-10-CM | POA: Diagnosis not present

## 2017-07-17 DIAGNOSIS — M25551 Pain in right hip: Secondary | ICD-10-CM | POA: Diagnosis not present

## 2017-07-17 DIAGNOSIS — I2510 Atherosclerotic heart disease of native coronary artery without angina pectoris: Secondary | ICD-10-CM | POA: Diagnosis not present

## 2017-07-17 DIAGNOSIS — Z8489 Family history of other specified conditions: Secondary | ICD-10-CM | POA: Diagnosis not present

## 2017-07-17 DIAGNOSIS — J9811 Atelectasis: Secondary | ICD-10-CM | POA: Diagnosis not present

## 2017-07-17 DIAGNOSIS — M8580 Other specified disorders of bone density and structure, unspecified site: Secondary | ICD-10-CM | POA: Diagnosis not present

## 2017-07-17 DIAGNOSIS — Z811 Family history of alcohol abuse and dependence: Secondary | ICD-10-CM | POA: Diagnosis not present

## 2017-07-17 DIAGNOSIS — Z743 Need for continuous supervision: Secondary | ICD-10-CM | POA: Diagnosis not present

## 2017-07-17 DIAGNOSIS — I120 Hypertensive chronic kidney disease with stage 5 chronic kidney disease or end stage renal disease: Secondary | ICD-10-CM | POA: Diagnosis not present

## 2017-07-17 DIAGNOSIS — Z9889 Other specified postprocedural states: Secondary | ICD-10-CM | POA: Diagnosis not present

## 2017-07-17 DIAGNOSIS — R0989 Other specified symptoms and signs involving the circulatory and respiratory systems: Secondary | ICD-10-CM | POA: Diagnosis not present

## 2017-07-17 DIAGNOSIS — G9341 Metabolic encephalopathy: Secondary | ICD-10-CM | POA: Diagnosis not present

## 2017-07-17 DIAGNOSIS — Z833 Family history of diabetes mellitus: Secondary | ICD-10-CM | POA: Diagnosis not present

## 2017-07-17 DIAGNOSIS — E875 Hyperkalemia: Secondary | ICD-10-CM | POA: Diagnosis not present

## 2017-07-17 DIAGNOSIS — R41841 Cognitive communication deficit: Secondary | ICD-10-CM | POA: Diagnosis not present

## 2017-07-17 DIAGNOSIS — Z8249 Family history of ischemic heart disease and other diseases of the circulatory system: Secondary | ICD-10-CM | POA: Diagnosis not present

## 2017-07-17 DIAGNOSIS — N281 Cyst of kidney, acquired: Secondary | ICD-10-CM | POA: Diagnosis not present

## 2017-07-17 DIAGNOSIS — N184 Chronic kidney disease, stage 4 (severe): Secondary | ICD-10-CM | POA: Diagnosis not present

## 2017-07-17 DIAGNOSIS — M25451 Effusion, right hip: Secondary | ICD-10-CM | POA: Diagnosis not present

## 2017-07-17 DIAGNOSIS — K219 Gastro-esophageal reflux disease without esophagitis: Secondary | ICD-10-CM | POA: Diagnosis not present

## 2017-07-17 DIAGNOSIS — E039 Hypothyroidism, unspecified: Secondary | ICD-10-CM | POA: Diagnosis not present

## 2017-07-17 DIAGNOSIS — Z96652 Presence of left artificial knee joint: Secondary | ICD-10-CM | POA: Diagnosis not present

## 2017-07-17 DIAGNOSIS — M329 Systemic lupus erythematosus, unspecified: Secondary | ICD-10-CM | POA: Diagnosis not present

## 2017-07-17 DIAGNOSIS — Z992 Dependence on renal dialysis: Secondary | ICD-10-CM | POA: Diagnosis not present

## 2017-07-18 DIAGNOSIS — D631 Anemia in chronic kidney disease: Secondary | ICD-10-CM | POA: Diagnosis not present

## 2017-07-18 DIAGNOSIS — N186 End stage renal disease: Secondary | ICD-10-CM | POA: Diagnosis not present

## 2017-07-18 DIAGNOSIS — I10 Essential (primary) hypertension: Secondary | ICD-10-CM | POA: Diagnosis not present

## 2017-07-19 ENCOUNTER — Other Ambulatory Visit: Payer: Self-pay

## 2017-07-19 NOTE — Patient Outreach (Signed)
[facility] [facility]) Care Management  07/19/2017  Makyia Erxleben 1960/10/01 [id number]     Telephone Screen  Referral Date: 07/18/17 Referral Source: HTA UM Dept Referral Reason #1: "member lives alone and expressed concern with [facility] assistance" Referral Reason #2: " "pt is 57 yr old female who has had 5 admissions within the past 5 months, was receiving [place] prior to this admission of 07/17/17, currently hospitalized and being treated for NSTEMI and progression of CKD to start dialysis in hospital" Insurance: HTA   RN CM received two different referrals for patient from same referral source on the same day. RN CM made outreach attempt to referral source-HTA UM Dept. Voicemail message for Harvin Hazel, referring RN requesting call back to further discuss referral.     Plan: RN CM will unable to make outreach attempt to patient as referral states patient currently inpatient.    Enzo Montgomery, RN,BSN,CCM [place] Management Telephonic Care Management Coordinator Direct Phone: [phone] Toll Free: [phone] Fax: [phone]

## 2017-07-23 ENCOUNTER — Other Ambulatory Visit: Payer: Self-pay

## 2017-07-23 DIAGNOSIS — I252 Old myocardial infarction: Secondary | ICD-10-CM | POA: Diagnosis not present

## 2017-07-23 DIAGNOSIS — N186 End stage renal disease: Secondary | ICD-10-CM | POA: Diagnosis not present

## 2017-07-23 DIAGNOSIS — R4182 Altered mental status, unspecified: Secondary | ICD-10-CM | POA: Diagnosis present

## 2017-07-23 DIAGNOSIS — J189 Pneumonia, unspecified organism: Secondary | ICD-10-CM | POA: Diagnosis not present

## 2017-07-23 DIAGNOSIS — Z79890 Hormone replacement therapy (postmenopausal): Secondary | ICD-10-CM | POA: Diagnosis not present

## 2017-07-23 DIAGNOSIS — I10 Essential (primary) hypertension: Secondary | ICD-10-CM | POA: Diagnosis not present

## 2017-07-23 DIAGNOSIS — M659 Synovitis and tenosynovitis, unspecified: Secondary | ICD-10-CM | POA: Diagnosis not present

## 2017-07-23 DIAGNOSIS — M3214 Glomerular disease in systemic lupus erythematosus: Secondary | ICD-10-CM | POA: Diagnosis not present

## 2017-07-23 DIAGNOSIS — Z881 Allergy status to other antibiotic agents status: Secondary | ICD-10-CM | POA: Diagnosis not present

## 2017-07-23 DIAGNOSIS — M25551 Pain in right hip: Secondary | ICD-10-CM | POA: Diagnosis not present

## 2017-07-23 DIAGNOSIS — I120 Hypertensive chronic kidney disease with stage 5 chronic kidney disease or end stage renal disease: Secondary | ICD-10-CM | POA: Diagnosis not present

## 2017-07-23 DIAGNOSIS — Z7952 Long term (current) use of systemic steroids: Secondary | ICD-10-CM | POA: Diagnosis not present

## 2017-07-23 DIAGNOSIS — N179 Acute kidney failure, unspecified: Secondary | ICD-10-CM | POA: Diagnosis not present

## 2017-07-23 DIAGNOSIS — J181 Lobar pneumonia, unspecified organism: Secondary | ICD-10-CM | POA: Diagnosis not present

## 2017-07-23 DIAGNOSIS — R2689 Other abnormalities of gait and mobility: Secondary | ICD-10-CM | POA: Diagnosis not present

## 2017-07-23 DIAGNOSIS — R4020 Unspecified coma: Secondary | ICD-10-CM | POA: Diagnosis not present

## 2017-07-23 DIAGNOSIS — R279 Unspecified lack of coordination: Secondary | ICD-10-CM | POA: Diagnosis not present

## 2017-07-23 DIAGNOSIS — I509 Heart failure, unspecified: Secondary | ICD-10-CM | POA: Diagnosis not present

## 2017-07-23 DIAGNOSIS — M810 Age-related osteoporosis without current pathological fracture: Secondary | ICD-10-CM | POA: Diagnosis not present

## 2017-07-23 DIAGNOSIS — M879 Osteonecrosis, unspecified: Secondary | ICD-10-CM | POA: Diagnosis not present

## 2017-07-23 DIAGNOSIS — Z743 Need for continuous supervision: Secondary | ICD-10-CM | POA: Diagnosis not present

## 2017-07-23 DIAGNOSIS — I2510 Atherosclerotic heart disease of native coronary artery without angina pectoris: Secondary | ICD-10-CM | POA: Diagnosis not present

## 2017-07-23 DIAGNOSIS — R011 Cardiac murmur, unspecified: Secondary | ICD-10-CM | POA: Diagnosis not present

## 2017-07-23 DIAGNOSIS — M25531 Pain in right wrist: Secondary | ICD-10-CM | POA: Diagnosis not present

## 2017-07-23 DIAGNOSIS — R509 Fever, unspecified: Secondary | ICD-10-CM | POA: Diagnosis not present

## 2017-07-23 DIAGNOSIS — D649 Anemia, unspecified: Secondary | ICD-10-CM | POA: Diagnosis not present

## 2017-07-23 DIAGNOSIS — Z8673 Personal history of transient ischemic attack (TIA), and cerebral infarction without residual deficits: Secondary | ICD-10-CM | POA: Diagnosis not present

## 2017-07-23 DIAGNOSIS — M7989 Other specified soft tissue disorders: Secondary | ICD-10-CM | POA: Diagnosis not present

## 2017-07-23 DIAGNOSIS — I214 Non-ST elevation (NSTEMI) myocardial infarction: Secondary | ICD-10-CM | POA: Diagnosis not present

## 2017-07-23 DIAGNOSIS — R2233 Localized swelling, mass and lump, upper limb, bilateral: Secondary | ICD-10-CM | POA: Diagnosis not present

## 2017-07-23 DIAGNOSIS — Z96653 Presence of artificial knee joint, bilateral: Secondary | ICD-10-CM | POA: Diagnosis not present

## 2017-07-23 DIAGNOSIS — J9811 Atelectasis: Secondary | ICD-10-CM | POA: Diagnosis not present

## 2017-07-23 DIAGNOSIS — E039 Hypothyroidism, unspecified: Secondary | ICD-10-CM | POA: Diagnosis not present

## 2017-07-23 DIAGNOSIS — M1990 Unspecified osteoarthritis, unspecified site: Secondary | ICD-10-CM | POA: Diagnosis not present

## 2017-07-23 DIAGNOSIS — E785 Hyperlipidemia, unspecified: Secondary | ICD-10-CM | POA: Diagnosis not present

## 2017-07-23 DIAGNOSIS — Z7982 Long term (current) use of aspirin: Secondary | ICD-10-CM | POA: Diagnosis not present

## 2017-07-23 DIAGNOSIS — S6992XA Unspecified injury of left wrist, hand and finger(s), initial encounter: Secondary | ICD-10-CM | POA: Diagnosis not present

## 2017-07-23 DIAGNOSIS — Z882 Allergy status to sulfonamides status: Secondary | ICD-10-CM | POA: Diagnosis not present

## 2017-07-23 DIAGNOSIS — I959 Hypotension, unspecified: Secondary | ICD-10-CM | POA: Diagnosis not present

## 2017-07-23 DIAGNOSIS — Z79891 Long term (current) use of opiate analgesic: Secondary | ICD-10-CM | POA: Diagnosis not present

## 2017-07-23 DIAGNOSIS — N2581 Secondary hyperparathyroidism of renal origin: Secondary | ICD-10-CM | POA: Diagnosis not present

## 2017-07-23 DIAGNOSIS — M96842 Postprocedural seroma of a musculoskeletal structure following a musculoskeletal system procedure: Secondary | ICD-10-CM | POA: Diagnosis not present

## 2017-07-23 DIAGNOSIS — I132 Hypertensive heart and chronic kidney disease with heart failure and with stage 5 chronic kidney disease, or end stage renal disease: Secondary | ICD-10-CM | POA: Diagnosis not present

## 2017-07-23 DIAGNOSIS — R41841 Cognitive communication deficit: Secondary | ICD-10-CM | POA: Diagnosis not present

## 2017-07-23 DIAGNOSIS — D631 Anemia in chronic kidney disease: Secondary | ICD-10-CM | POA: Diagnosis not present

## 2017-07-23 DIAGNOSIS — I658 Occlusion and stenosis of other precerebral arteries: Secondary | ICD-10-CM | POA: Diagnosis not present

## 2017-07-23 DIAGNOSIS — Z992 Dependence on renal dialysis: Secondary | ICD-10-CM | POA: Diagnosis not present

## 2017-07-23 DIAGNOSIS — M25532 Pain in left wrist: Secondary | ICD-10-CM | POA: Diagnosis not present

## 2017-07-23 DIAGNOSIS — M25451 Effusion, right hip: Secondary | ICD-10-CM | POA: Diagnosis not present

## 2017-07-23 DIAGNOSIS — I7300 Raynaud's syndrome without gangrene: Secondary | ICD-10-CM | POA: Diagnosis not present

## 2017-07-23 DIAGNOSIS — Z96642 Presence of left artificial hip joint: Secondary | ICD-10-CM | POA: Diagnosis not present

## 2017-07-23 DIAGNOSIS — I711 Thoracic aortic aneurysm, ruptured: Secondary | ICD-10-CM | POA: Diagnosis not present

## 2017-07-23 DIAGNOSIS — K219 Gastro-esophageal reflux disease without esophagitis: Secondary | ICD-10-CM | POA: Diagnosis not present

## 2017-07-23 DIAGNOSIS — G9341 Metabolic encephalopathy: Secondary | ICD-10-CM | POA: Diagnosis not present

## 2017-07-23 DIAGNOSIS — M109 Gout, unspecified: Secondary | ICD-10-CM | POA: Diagnosis not present

## 2017-07-23 DIAGNOSIS — M6281 Muscle weakness (generalized): Secondary | ICD-10-CM | POA: Diagnosis not present

## 2017-07-23 NOTE — Patient Outreach (Signed)
[facility]) Care Management  07/23/2017  Alida Greiner [birth year] [id number]    Referral Date: 07/18/17 Referral Source: HTA UM Dept Referral Reason #1: "member lives alone and expressed concern with [facility] assistance" Referral Reason #2:  "pt is 57 yr old female who has had 5 admissions within the past 5 months, was receiving [place] prior to this admission of 07/17/17, currently hospitalized and being treated for NSTEMI and progression of CKD to start dialysis in hospital" Insurance: HTA    Outreach attempt #2 to patient. No answer at present. RN CM left HIPAA compliant voicemail message along with contact info.     Plan: RN CM will make outreach attempt to patient within 3-4 business days. RN CM will send unsuccessful letter to patient.  Enzo Montgomery, RN,BSN,CCM [place] Management Telephonic Care Management Coordinator Direct Phone: [phone] Toll Free: [phone] Fax: [phone]

## 2017-07-24 DIAGNOSIS — N2581 Secondary hyperparathyroidism of renal origin: Secondary | ICD-10-CM | POA: Diagnosis not present

## 2017-07-24 DIAGNOSIS — D631 Anemia in chronic kidney disease: Secondary | ICD-10-CM | POA: Diagnosis not present

## 2017-07-24 DIAGNOSIS — I10 Essential (primary) hypertension: Secondary | ICD-10-CM | POA: Diagnosis not present

## 2017-07-24 DIAGNOSIS — N186 End stage renal disease: Secondary | ICD-10-CM | POA: Diagnosis not present

## 2017-07-25 ENCOUNTER — Other Ambulatory Visit: Payer: Self-pay

## 2017-07-25 NOTE — Patient Outreach (Signed)
[facility]) Care Management  07/25/2017  Crystal Clarke 09/21/1960 [id number]   Referral Date:07/18/17 Referral Source:HTA UM Dept Referral Reason#1:"member lives alone and expressed concern with [facility] assistance" Referral Reason #2:  "pt is 57 yr old female who has had 5 admissions within the past 5 months, was receiving [place] prior to this admission of 07/17/17, currently hospitalized and being treated for NSTEMI and progression of CKD to start dialysis in hospital" Insurance:HTA     Outreach attempt #2 to patient. No answer at present and unable to leave message.     Plan: RN CM will make outreach attempt to patient within three business days.    Enzo Montgomery, RN,BSN,CCM [place] Management Telephonic Care Management Coordinator Direct Phone: [phone] Toll Free: [phone] Fax: [phone]

## 2017-07-26 ENCOUNTER — Other Ambulatory Visit: Payer: Self-pay

## 2017-07-26 NOTE — Patient Outreach (Signed)
[facility] [facility]) Care Management  07/26/2017  Kellyann Ordway 1960-05-25 [id number]   Referral Date:07/18/17 Referral Source:[facility]A UM Dept Referral Reason#1:"member lives alone and expressed concern with [facility] assistance" Referral Reason #2: "pt is 57 yr old female who has had 5 admissions within the past 5 months, was receiving [place] prior to this admission of 07/17/17, currently hospitalized and being treated for NSTEMI and progression of CKD to start dialysis in hospital" Insurance:HTA    Outreach attempt #3 to patient. No answer and unable to leave message.      Plan: RN CM will close case if no response from letter mailed to patient.  Enzo Montgomery, RN,BSN,CCM [place] Management Telephonic Care Management Coordinator Direct Phone: [phone] Toll Free: [phone] Fax: [phone]

## 2017-07-30 ENCOUNTER — Emergency Department (HOSPITAL_COMMUNITY): Payer: PPO

## 2017-07-30 ENCOUNTER — Inpatient Hospital Stay (HOSPITAL_COMMUNITY)
Admission: EM | Admit: 2017-07-30 | Discharge: 2017-08-02 | DRG: 545 | Disposition: A | Discharge status: Home Health | Payer: PPO | Attending: Student in an Organized Health Care Education/Training Program | Admitting: Student in an Organized Health Care Education/Training Program

## 2017-07-30 ENCOUNTER — Other Ambulatory Visit: Payer: Self-pay

## 2017-07-30 ENCOUNTER — Encounter (HOSPITAL_COMMUNITY): Payer: Self-pay | Admitting: Emergency Medicine

## 2017-07-30 DIAGNOSIS — Z881 Allergy status to other antibiotic agents status: Secondary | ICD-10-CM | POA: Diagnosis not present

## 2017-07-30 DIAGNOSIS — S6992XA Unspecified injury of left wrist, hand and finger(s), initial encounter: Secondary | ICD-10-CM | POA: Diagnosis not present

## 2017-07-30 DIAGNOSIS — I132 Hypertensive heart and chronic kidney disease with heart failure and with stage 5 chronic kidney disease, or end stage renal disease: Secondary | ICD-10-CM | POA: Diagnosis not present

## 2017-07-30 DIAGNOSIS — R4182 Altered mental status, unspecified: Secondary | ICD-10-CM | POA: Diagnosis present

## 2017-07-30 DIAGNOSIS — N186 End stage renal disease: Secondary | ICD-10-CM | POA: Diagnosis present

## 2017-07-30 DIAGNOSIS — Z882 Allergy status to sulfonamides status: Secondary | ICD-10-CM

## 2017-07-30 DIAGNOSIS — M25432 Effusion, left wrist: Secondary | ICD-10-CM | POA: Diagnosis not present

## 2017-07-30 DIAGNOSIS — Z79890 Hormone replacement therapy (postmenopausal): Secondary | ICD-10-CM | POA: Diagnosis not present

## 2017-07-30 DIAGNOSIS — D649 Anemia, unspecified: Secondary | ICD-10-CM | POA: Diagnosis not present

## 2017-07-30 DIAGNOSIS — Z992 Dependence on renal dialysis: Secondary | ICD-10-CM | POA: Diagnosis not present

## 2017-07-30 DIAGNOSIS — J9811 Atelectasis: Secondary | ICD-10-CM | POA: Diagnosis not present

## 2017-07-30 DIAGNOSIS — I509 Heart failure, unspecified: Secondary | ICD-10-CM | POA: Diagnosis not present

## 2017-07-30 DIAGNOSIS — Z8673 Personal history of transient ischemic attack (TIA), and cerebral infarction without residual deficits: Secondary | ICD-10-CM | POA: Diagnosis not present

## 2017-07-30 DIAGNOSIS — Z79891 Long term (current) use of opiate analgesic: Secondary | ICD-10-CM

## 2017-07-30 DIAGNOSIS — E039 Hypothyroidism, unspecified: Secondary | ICD-10-CM | POA: Diagnosis present

## 2017-07-30 DIAGNOSIS — N2581 Secondary hyperparathyroidism of renal origin: Secondary | ICD-10-CM | POA: Diagnosis not present

## 2017-07-30 DIAGNOSIS — I252 Old myocardial infarction: Secondary | ICD-10-CM | POA: Diagnosis not present

## 2017-07-30 DIAGNOSIS — M3214 Glomerular disease in systemic lupus erythematosus: Principal | ICD-10-CM | POA: Diagnosis present

## 2017-07-30 DIAGNOSIS — R011 Cardiac murmur, unspecified: Secondary | ICD-10-CM | POA: Diagnosis present

## 2017-07-30 DIAGNOSIS — M109 Gout, unspecified: Secondary | ICD-10-CM | POA: Diagnosis not present

## 2017-07-30 DIAGNOSIS — M898X9 Other specified disorders of bone, unspecified site: Secondary | ICD-10-CM | POA: Diagnosis present

## 2017-07-30 DIAGNOSIS — M1990 Unspecified osteoarthritis, unspecified site: Secondary | ICD-10-CM | POA: Diagnosis not present

## 2017-07-30 DIAGNOSIS — Z96642 Presence of left artificial hip joint: Secondary | ICD-10-CM | POA: Diagnosis not present

## 2017-07-30 DIAGNOSIS — M879 Osteonecrosis, unspecified: Secondary | ICD-10-CM | POA: Diagnosis not present

## 2017-07-30 DIAGNOSIS — M96842 Postprocedural seroma of a musculoskeletal structure following a musculoskeletal system procedure: Secondary | ICD-10-CM | POA: Diagnosis not present

## 2017-07-30 DIAGNOSIS — Z96653 Presence of artificial knee joint, bilateral: Secondary | ICD-10-CM | POA: Diagnosis not present

## 2017-07-30 DIAGNOSIS — I711 Thoracic aortic aneurysm, ruptured: Secondary | ICD-10-CM | POA: Diagnosis not present

## 2017-07-30 DIAGNOSIS — M810 Age-related osteoporosis without current pathological fracture: Secondary | ICD-10-CM | POA: Diagnosis not present

## 2017-07-30 DIAGNOSIS — I658 Occlusion and stenosis of other precerebral arteries: Secondary | ICD-10-CM | POA: Diagnosis not present

## 2017-07-30 DIAGNOSIS — M329 Systemic lupus erythematosus, unspecified: Secondary | ICD-10-CM

## 2017-07-30 DIAGNOSIS — Z7982 Long term (current) use of aspirin: Secondary | ICD-10-CM

## 2017-07-30 DIAGNOSIS — E785 Hyperlipidemia, unspecified: Secondary | ICD-10-CM | POA: Diagnosis not present

## 2017-07-30 DIAGNOSIS — J181 Lobar pneumonia, unspecified organism: Secondary | ICD-10-CM | POA: Diagnosis not present

## 2017-07-30 DIAGNOSIS — Z85528 Personal history of other malignant neoplasm of kidney: Secondary | ICD-10-CM

## 2017-07-30 DIAGNOSIS — M25451 Effusion, right hip: Secondary | ICD-10-CM

## 2017-07-30 DIAGNOSIS — I959 Hypotension, unspecified: Secondary | ICD-10-CM | POA: Diagnosis not present

## 2017-07-30 DIAGNOSIS — I2510 Atherosclerotic heart disease of native coronary artery without angina pectoris: Secondary | ICD-10-CM | POA: Diagnosis present

## 2017-07-30 DIAGNOSIS — M25532 Pain in left wrist: Secondary | ICD-10-CM

## 2017-07-30 DIAGNOSIS — D631 Anemia in chronic kidney disease: Secondary | ICD-10-CM | POA: Diagnosis not present

## 2017-07-30 DIAGNOSIS — M25531 Pain in right wrist: Secondary | ICD-10-CM | POA: Diagnosis not present

## 2017-07-30 DIAGNOSIS — J189 Pneumonia, unspecified organism: Secondary | ICD-10-CM

## 2017-07-30 DIAGNOSIS — I7300 Raynaud's syndrome without gangrene: Secondary | ICD-10-CM | POA: Diagnosis not present

## 2017-07-30 DIAGNOSIS — Z66 Do not resuscitate: Secondary | ICD-10-CM | POA: Diagnosis present

## 2017-07-30 DIAGNOSIS — M11232 Other chondrocalcinosis, left wrist: Secondary | ICD-10-CM | POA: Diagnosis present

## 2017-07-30 DIAGNOSIS — Z7952 Long term (current) use of systemic steroids: Secondary | ICD-10-CM

## 2017-07-30 DIAGNOSIS — M25551 Pain in right hip: Secondary | ICD-10-CM | POA: Diagnosis not present

## 2017-07-30 DIAGNOSIS — Z888 Allergy status to other drugs, medicaments and biological substances status: Secondary | ICD-10-CM

## 2017-07-30 DIAGNOSIS — K219 Gastro-esophageal reflux disease without esophagitis: Secondary | ICD-10-CM | POA: Diagnosis present

## 2017-07-30 DIAGNOSIS — R509 Fever, unspecified: Secondary | ICD-10-CM | POA: Diagnosis present

## 2017-07-30 DIAGNOSIS — I120 Hypertensive chronic kidney disease with stage 5 chronic kidney disease or end stage renal disease: Secondary | ICD-10-CM | POA: Diagnosis not present

## 2017-07-30 DIAGNOSIS — R4020 Unspecified coma: Secondary | ICD-10-CM | POA: Diagnosis not present

## 2017-07-30 DIAGNOSIS — Z951 Presence of aortocoronary bypass graft: Secondary | ICD-10-CM

## 2017-07-30 DIAGNOSIS — M659 Synovitis and tenosynovitis, unspecified: Secondary | ICD-10-CM | POA: Diagnosis not present

## 2017-07-30 DIAGNOSIS — M7989 Other specified soft tissue disorders: Secondary | ICD-10-CM | POA: Diagnosis not present

## 2017-07-30 DIAGNOSIS — R2233 Localized swelling, mass and lump, upper limb, bilateral: Secondary | ICD-10-CM | POA: Diagnosis not present

## 2017-07-30 LAB — COMPREHENSIVE METABOLIC PANEL
ALT: 9 U/L — AB (ref 14–54)
AST: 23 U/L (ref 15–41)
Albumin: 1.6 g/dL — ABNORMAL LOW (ref 3.5–5.0)
Alkaline Phosphatase: 92 U/L (ref 38–126)
Anion gap: 13 (ref 5–15)
BUN: 24 mg/dL — AB (ref 6–20)
CHLORIDE: 100 mmol/L — AB (ref 101–111)
CO2: 24 mmol/L (ref 22–32)
CREATININE: 5.06 mg/dL — AB (ref 0.44–1.00)
Calcium: 8 mg/dL — ABNORMAL LOW (ref 8.9–10.3)
GFR calc non Af Amer: 9 mL/min — ABNORMAL LOW (ref >60)
GFR, EST AFRICAN AMERICAN: 10 mL/min — AB (ref >60)
Glucose, Bld: 92 mg/dL (ref 65–99)
Potassium: 3.5 mmol/L (ref 3.5–5.1)
SODIUM: 137 mmol/L (ref 135–145)
Total Bilirubin: 0.7 mg/dL (ref 0.3–1.2)
Total Protein: 5.3 g/dL — ABNORMAL LOW (ref 6.5–8.1)

## 2017-07-30 LAB — CBC WITH DIFFERENTIAL/PLATELET
Basophils Absolute: 0 10*3/uL (ref 0.0–0.1)
Basophils Relative: 0 %
Eosinophils Absolute: 0.1 10*3/uL (ref 0.0–0.7)
Eosinophils Relative: 1 %
HCT: 19.4 % — ABNORMAL LOW (ref 36.0–46.0)
Hemoglobin: 5.8 g/dL — CL (ref 12.0–15.0)
Lymphocytes Relative: 15 %
Lymphs Abs: 1.2 10*3/uL (ref 0.7–4.0)
MCH: 27.4 pg (ref 26.0–34.0)
MCHC: 29.9 g/dL — ABNORMAL LOW (ref 30.0–36.0)
MCV: 91.5 fL (ref 78.0–100.0)
Monocytes Absolute: 1.3 10*3/uL — ABNORMAL HIGH (ref 0.1–1.0)
Monocytes Relative: 17 %
Neutro Abs: 5.1 10*3/uL (ref 1.7–7.7)
Neutrophils Relative %: 67 %
Platelets: 267 10*3/uL (ref 150–400)
RBC: 2.12 MIL/uL — ABNORMAL LOW (ref 3.87–5.11)
RDW: 16.7 % — ABNORMAL HIGH (ref 11.5–15.5)
WBC: 7.7 10*3/uL (ref 4.0–10.5)

## 2017-07-30 LAB — SAVE SMEAR

## 2017-07-30 LAB — PROTIME-INR
INR: 1.3
PROTHROMBIN TIME: 16.1 s — AB (ref 11.4–15.2)

## 2017-07-30 LAB — I-STAT CG4 LACTIC ACID, ED
Lactic Acid, Venous: 1.13 mmol/L (ref 0.5–1.9)
Lactic Acid, Venous: 0.92 mmol/L (ref 0.5–1.9)

## 2017-07-30 LAB — ABO/RH: ABO/RH(D): A POS

## 2017-07-30 LAB — PREPARE RBC (CROSSMATCH)

## 2017-07-30 LAB — TECHNOLOGIST SMEAR REVIEW

## 2017-07-30 LAB — LIPASE, BLOOD: Lipase: 27 U/L (ref 11–51)

## 2017-07-30 MED ORDER — FLUOXETINE HCL 20 MG PO CAPS
20.0000 mg | ORAL_CAPSULE | Freq: Every day | ORAL | Status: DC
  Administered 2017-07-30 – 2017-08-01 (×3): 20 mg via ORAL
  Filled 2017-07-30 (×3): qty 1

## 2017-07-30 MED ORDER — FLUOXETINE HCL 20 MG PO TABS
20.0000 mg | ORAL_TABLET | Freq: Every day | ORAL | Status: DC
  Filled 2017-07-30: qty 1

## 2017-07-30 MED ORDER — SODIUM CHLORIDE 0.9 % IV SOLN
10.0000 mL/h | Freq: Once | INTRAVENOUS | Status: AC
  Administered 2017-07-30: 10 mL/h via INTRAVENOUS

## 2017-07-30 MED ORDER — EZETIMIBE 10 MG PO TABS
10.0000 mg | ORAL_TABLET | Freq: Every day | ORAL | Status: DC
  Administered 2017-07-31 – 2017-08-02 (×3): 10 mg via ORAL
  Filled 2017-07-30 (×4): qty 1

## 2017-07-30 MED ORDER — FERROUS SULFATE 325 (65 FE) MG PO TABS
325.0000 mg | ORAL_TABLET | Freq: Every day | ORAL | Status: DC
  Administered 2017-08-01 – 2017-08-02 (×2): 325 mg via ORAL
  Filled 2017-07-30 (×2): qty 1

## 2017-07-30 MED ORDER — QUETIAPINE FUMARATE ER 50 MG PO TB24
150.0000 mg | ORAL_TABLET | Freq: Every day | ORAL | Status: DC
Start: 2017-07-30 — End: 2017-08-02
  Administered 2017-07-30 – 2017-08-01 (×3): 150 mg via ORAL
  Filled 2017-07-30 (×4): qty 3

## 2017-07-30 MED ORDER — SODIUM CHLORIDE 0.9 % IV BOLUS
500.0000 mL | Freq: Once | INTRAVENOUS | Status: AC
  Administered 2017-07-30: 500 mL via INTRAVENOUS

## 2017-07-30 MED ORDER — PANTOPRAZOLE SODIUM 40 MG PO TBEC
40.0000 mg | DELAYED_RELEASE_TABLET | Freq: Every day | ORAL | Status: DC
  Administered 2017-07-30 – 2017-08-02 (×4): 40 mg via ORAL
  Filled 2017-07-30 (×4): qty 1

## 2017-07-30 MED ORDER — ASPIRIN 81 MG PO CHEW
81.0000 mg | CHEWABLE_TABLET | Freq: Every day | ORAL | Status: DC
  Administered 2017-07-30 – 2017-08-02 (×4): 81 mg via ORAL
  Filled 2017-07-30 (×4): qty 1

## 2017-07-30 MED ORDER — ACETAMINOPHEN 325 MG PO TABS
650.0000 mg | ORAL_TABLET | Freq: Four times a day (QID) | ORAL | Status: DC | PRN
  Administered 2017-08-01: 650 mg via ORAL
  Filled 2017-07-30: qty 2

## 2017-07-30 MED ORDER — ATORVASTATIN CALCIUM 40 MG PO TABS
40.0000 mg | ORAL_TABLET | Freq: Every day | ORAL | Status: DC
  Administered 2017-07-31 – 2017-08-02 (×3): 40 mg via ORAL
  Filled 2017-07-30 (×3): qty 1

## 2017-07-30 MED ORDER — SODIUM CHLORIDE 0.9 % IV SOLN
2.0000 g | Freq: Once | INTRAVENOUS | Status: AC
  Administered 2017-07-30: 2 g via INTRAVENOUS
  Filled 2017-07-30: qty 2

## 2017-07-30 MED ORDER — SODIUM CHLORIDE 0.9 % IV BOLUS
1000.0000 mL | Freq: Once | INTRAVENOUS | Status: AC
  Administered 2017-07-30: 1000 mL via INTRAVENOUS

## 2017-07-30 MED ORDER — VANCOMYCIN HCL 10 G IV SOLR
1250.0000 mg | Freq: Once | INTRAVENOUS | Status: AC
  Administered 2017-07-30: 1250 mg via INTRAVENOUS
  Filled 2017-07-30: qty 1250

## 2017-07-30 MED ORDER — DICLOFENAC SODIUM 1 % TD GEL
2.0000 g | Freq: Three times a day (TID) | TRANSDERMAL | Status: DC | PRN
  Administered 2017-07-31 (×2): 2 g via TOPICAL
  Filled 2017-07-30 (×2): qty 100

## 2017-07-30 MED ORDER — VITAMIN D 1000 UNITS PO TABS
1000.0000 [IU] | ORAL_TABLET | Freq: Every day | ORAL | Status: DC
  Administered 2017-07-30 – 2017-08-02 (×4): 1000 [IU] via ORAL
  Filled 2017-07-30 (×4): qty 1

## 2017-07-30 NOTE — Discharge Summary (Addendum)
Name: Crystal Clarke MRN: [id number] DOB: 1961-02-07 57 y.o. PCP: Rochel Brome, MD  Date of Admission: 07/30/2017 10:11 AM Date of Discharge: 08/02/2017 Attending Physician: Oval Linsey, MD  Discharge Diagnosis: 1. SLE flare 2. Hypotension 3. Acute on chronic anemia 4. Hx of chronic right hip effusion and osteolysis 5. 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 Hx of hypothyroidism  Principal Problem:   Fever Active Problems:   Anemia   Left wrist pain   SLE exacerbation (HCC)   Discharge Medications: Allergies as of 08/02/2017      Reactions   Ace Inhibitors    Ancef [cefazolin]    Sulfa Antibiotics    Vancomycin    Hx Red Man Syndrome      Medication List    STOP taking these medications   isosorbide mononitrate 30 MG 24 hr tablet Commonly known as:  IMDUR   ramelteon 8 MG tablet Commonly known as:  ROZEREM     TAKE these medications   ALPRAZolam 0.5 MG tablet Commonly known as:  XANAX Take 0.5 mg by mouth 2 (two) times daily as needed.   aspirin 81 MG chewable tablet Chew 81 mg by mouth daily.   atorvastatin 40 MG tablet Commonly known as:  LIPITOR Take 40 mg by mouth daily.   carvedilol 6.25 MG tablet Commonly known as:  COREG Take 6.25 mg by mouth 2 (two) times daily with a meal. For BP greater than 100.   cholecalciferol 1000 units tablet Commonly known as:  VITAMIN D Take 1,000 Units by mouth daily.   clopidogrel 75 MG tablet Commonly known as:  PLAVIX Take 1 tablet (75 mg total) by mouth daily.   dexlansoprazole 60 MG capsule Commonly known as:  DEXILANT Take 60 mg by mouth daily.   diclofenac sodium 1 % Gel Commonly known as:  VOLTAREN Apply topically 4 (four) times daily.   epoetin alfa 20000 UNIT/ML injection Commonly known as:  EPOGEN,PROCRIT Inject 20,000 Units into the skin. Every other week   ezetimibe 10 MG tablet Commonly known as:  ZETIA Take 10 mg by mouth daily.   ferrous sulfate 325 (65 FE) MG tablet Take 325 mg  by mouth daily with breakfast.   FLUoxetine 20 MG tablet Commonly known as:  PROZAC Take 20 mg by mouth daily.   hydroxychloroquine 200 MG tablet Commonly known as:  PLAQUENIL Take 1 tablet (200 mg total) by mouth daily.   levothyroxine 75 MCG tablet Commonly known as:  SYNTHROID, LEVOTHROID Take 1 tablet (75 mcg total) by mouth daily before breakfast. Start taking on:  08/03/2017 What changed:    medication strength  how much to take  when to take this   nitroGLYCERIN 0.4 MG SL tablet Commonly known as:  NITROSTAT Place 0.4 mg under the tongue every 5 (five) minutes as needed.   Oxycodone HCl 10 MG Tabs Take 1 tablet by mouth 2 (two) times daily as needed.   oxyCODONE 30 MG 12 hr tablet Take 1 tablet by mouth 2 (two) times daily.   predniSONE 10 MG tablet Commonly known as:  DELTASONE Take 4 tablets (40 mg total) by mouth daily with breakfast for 2 days, THEN 3 tablets (30 mg total) daily with breakfast for 2 days, THEN 2 tablets (20 mg total) daily with breakfast for 2 days, THEN 1 tablet (10 mg total) daily with breakfast for 2 days, THEN 0.5 tablets (5 mg total) daily with breakfast for 15 days. Start taking on:  08/02/2017 What changed:  See the new instructions.   QUEtiapine Fumarate 150 MG 24 hr tablet Commonly known as:  SEROQUEL XR Take 150 mg by mouth at bedtime.   ranitidine 150 MG capsule Commonly known as:  ZANTAC Take 150 mg by mouth at bedtime.   SENNA PLUS PO Take 2 tablets by mouth 2 (two) times daily.   zolpidem 10 MG tablet Commonly known as:  AMBIEN Take 10 mg by mouth at bedtime as needed.       Disposition and follow-up:   Ms.Meka Tetro was discharged from [facility] in Stable condition.  At the hospital follow up visit please address:  1.  SLE - Patient discharged on steroid taper and plaquenil. Please adjust these medications as needed. Ensure follow up with Rheumatology.  2.  Please ensure continued HD  3.   Patient discharged with [facility] PT/OT - please ensure this has been set up.  4.  Patient noted to have CXR with infiltrate in superior segment of left lower lobe. Repeat in 4-6 weeks to assess for resolution.  5.  Labs / imaging needed at time of follow-up: CXR in 4-6 weeks  6.  Pending labs/ test needing follow-up: Final BCx (negative x3 days), Final synovial fluid culture (negative x2 days), Hep B surface Ag  Follow-up Appointments: Follow-up Information    Cox, Kirsten, MD. Schedule an appointment as soon as possible for a visit in 1 week(s).   Specialties:  Family Medicine, Interventional Cardiology, Radiology, Anesthesiology Contact information: [address] [place] [address] [phone]        Wendall Mola, MD. Schedule an appointment as soon as possible for a visit in 1 week(s).   Specialty:  Internal Medicine Contact information: [place] [address] [phone]        [facility] Follow up.   Why:  Provide home health  Contact information: [place] [address] [phone]           Hospital Course by problem list: Principal Problem:   Fever Active Problems:   Anemia   Left wrist pain   SLE exacerbation ([place])   1. SLE flare Bilateral wrist swelling Patient admitted with acute onset of left wrist pain and swelling 2 days prior to admission. X-ray did not show fracture. The following morning, she reported new onset of right wrist swelling. With multiple areas of arthralgia, lupus flare was favored as the etiology for her pain. IR was consulted for aspiration of left joint, which showed no crystals or organisms. Empiric vancomycin was started on admission and stopped once septic arthritis was ruled out and no other source of infection was identified. She remained afebrile, and thus the etiology of her arthralgias was felt to be secondary to lupus. She was discharged on prednisone  taper and plaquenil and instructed to follow up with her Rheumatologist for further care.  2. Reported fever Patient was admitted for fever to 100.4 at SNF. There was initial concern on CXR for LLL pneumonia, however on review of the x-ray, this was felt to be more consistent with subsegmental collapse in the setting of no respiratory complaints. She did have a murmur on exam, however she reports that she has a known murmur. She was afebrile during her inpatient stay. Empiric vancomycin was discontinued and she remained afebrile and hemodynamically stable. She has multiple sources for possible fever, including SLE flare vs septic arthritis vs tunneled dialysis catheter, however infection  was less likely given negative blood cultures and no infectious signs or symptoms. Reported fever may be secondary to lupus.  2. Hypotension Patient was noted to be asymptomatically hypotensive to 90s/50s. During previous admission, sBP were in 140s-150s. Patient has a history of lupus and has had several courses of prednisone (last course was over a year ago). Random cortisol was checked, and did not suggest adrenal insufficiency. No source for infection. Per Nephrology, she was felt to be under her dry weight compared to her outpatient dialysis. Her BP did improve to 120s/70s on discharge.  4. Acute on chronic anemia Hb 5.8 on admission with a baseline of ~7. She received 2 units pRBC transfusion with improvement to 8.8. Peripheral blood smear showed evidence of teardrop cells and elliptocytes, thus work-up for hemolytic anemia was initiated. Haptoglobin, LDH, and ferritin were all elevated, suggestive of anemia of chronic disease. Patient denied hematochezia, melena, or hematuria, although FOBT was unable to be completed. CT of right hip was negative for evidence of hematoma. TSH was previously normal in Jan 2019. Etiology of anemia felt to be secondary to chronic inflammatory disease (lupus, ESRD).  5. 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 She was recently admitted at [facility], where she had her right hip aspirated, which showed WBC 34K and no organisms or crystals. Cultures were negative at that time. Orthopedics was consulted and felt that her right hip effusion was likely a chronic seroma and unlikely to be septic joint, thus no intervention was pursued.  6. Hx of SLE Previously has been on steroids, immunomodulator therapy (plaquenil), cytoxan, and cell-cept, however not currently on anything due to multiple recent complicating infections. Follows with Rheumatology at [facility]. Reports last steroid dose was over a year ago. Discharged with prednisone taper and plaquenil and advised to f/u with Rheumatology.  7. 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 Nephrology was consulted to assist with inpatient HD. Received HD on 5/15.  8. Hx of hypothyroidism TSH previously normal in Jan 2019. Continued home synthroid.  Discharge Vitals:   BP 102/60 (BP Location: Left Arm)   Pulse 61   Temp 97.9 F (36.6 C) (Oral)   Resp 16   Ht 5\' 5"  (1.651 m)   Wt 141 lb 1.5 oz (64 kg)   SpO2 100%   BMI 23.48 kg/m   Pertinent Labs, Studies, and Procedures:  CBC Latest Ref Rng & Units 08/02/2017 08/01/2017 07/31/2017  WBC 4.0 - 10.5 K/uL 6.1 6.1 5.6  Hemoglobin 12.0 - 15.0 g/dL 8.5(L) 8.5(L) 8.8(L)  Hematocrit 36.0 - 46.0 % 27.6(L) 28.0(L) 28.2(L)  Platelets 150 - 400 K/uL 265 246 246   CMP Latest Ref Rng & Units 08/02/2017 07/31/2017 07/30/2017  Glucose 65 - 99 mg/dL 88 87 92  BUN 6 - 20 mg/dL 51(H) 30(H) 24(H)  Creatinine 0.44 - 1.00 mg/dL 4.63(H) 4.80(H) 5.06(H)  Sodium 135 - 145 mmol/L 143 139 137  Potassium 3.5 - 5.1 mmol/L 3.3(L) 3.7 3.5  Chloride 101 - 111 mmol/L 106 105 100(L)  CO2 22 - 32 mmol/L 25 22 24   Calcium 8.9 - 10.3 mg/dL 7.9(L) 7.8(L) 8.0(L)  Total  Protein 6.5 - 8.1 g/dL - - 5.3(L)  Total Bilirubin 0.3 - 1.2 mg/dL - - 0.7  Alkaline Phos 38 - 126 U/L - - 92  AST 15 - 41 U/L - - 23  ALT 14 - 54 U/L - - 9(L)   Lactic acid 1.13 BCx negative x3d Lipase 27 INR 1.30 Peripheral blood smear - teardrop cells and elliptocytes HIV nonreactive Left wrist synovial fluid - Cx NG x2d, colorless clear fluid with no crystals seen Ferritin 759 Haptoglobin 537 LDH 227 Iron 23, TIBC 120, sat ratio 19, UIBC 97 Random cortisol 14.4  Left wrist x-ray 07/30/2017 No acute osseous injury of the left wrist.  Right hip x-ray 07/30/2017 Chronic osteolysis of the right hip with no normal appearing femoral head or neck. High positioning of the femoral shaft and lateral displacement. Correlation with with the patient's clinical history and previous images not available to me is needed.  CT head 07/30/2017 1. Nonspecific deep white matter hypodensity in the internal capsules and periventricular frontal lobes. Differential is broad and includes chronic small vessel ischemia, posterior reversal encephalopathy syndrome, lupus cerebritis and other inflammatory causes. MRI brain without and with IV contrast may be obtained as clinically warranted. 2. No acute intracranial hemorrhage. No mass effect. No hydrocephalus.  CT right hip 07/31/2017 1. Chronic osteolysis of the right hip, with resorption of the right femoral head and neck. Superior displacement of the remaining proximal right femur. 2. Large fluid collection surrounding the proximal right femur, measuring approximately 18 x 13 x 12 cm. Peripheral soft tissue thickening noted, concerning for a septic joint effusion. No definite evidence of hematoma. 3. Diffuse soft tissue edema and skin thickening along the proximal right thigh.  Discharge Instructions: Discharge Instructions    Diet - low sodium heart healthy   Complete by:  As directed    Discharge instructions   Complete by:  As directed      Ms. Hellstrom,  It was good to meet you.  While you were in the hospital, we were looking for the reason for your wrist swelling. We did not see evidence of an infection or gout. We think this is related to a flare of your lupus. - Please take prednisone and plaquenil when you get home. - Please also schedule an appointment with your Rheumatology doctor so that he can continue to manage your lupus.  We also treated you with antibiotics because you had a fever before you came in. We could not find a place where you had an infection, so we stopped these antibiotics and you did not have any more fevers so we do not think you have an infection.  Finally, we treated you for your blood counts being low. They got better with some blood that we gave you. Please make sure you continue going to dialysis on your typical schedule.  We have sent orders for therapy to come to your house to help you increase your strength. Please schedule a follow up appointment with your primary doctor.   Face-to-face encounter (required for Medicare/Medicaid patients)   Complete by:  As directed    I Colbert Ewing certify that this patient is under my care and that I, or a nurse practitioner or physician's assistant working with me, had a face-to-face encounter that meets the physician face-to-face encounter requirements with this patient on 08/02/2017. The encounter with the patient was in whole, or in part for the following medical condition(s) which is the primary reason for home health care (List medical condition): patient has lupus and chronic right hip swelling, which makes it difficult for her to ambulate safely.   The encounter with the patient was in whole, or in part, for the following medical condition,  which is the primary reason for home health care:  lupus, chronic right hip swelling   I certify that, based on my findings, the following services are medically necessary home health services:  Physical therapy    Reason for Medically Necessary Home Health Services:  Therapy- Therapeutic Exercises to Increase Strength and Endurance   My clinical findings support the need for the above services:  Unable to leave home safely without assistance and/or assistive device   Further, I certify that my clinical findings support that this patient is homebound due to:  Unable to leave home safely without assistance   Home Health   Complete by:  As directed    To provide the following care/treatments:   PT OT     Increase activity slowly   Complete by:  As directed       Signed: Colbert Ewing, MD 08/02/2017, 6:11 PM   Pager: Mamie Nick [phone]

## 2017-07-30 NOTE — Progress Notes (Signed)
Pharmacy Antibiotic Note  Daryl Quiros is a 57 y.o. female admitted on 07/30/2017 with pneumonia.  Pharmacy has been consulted for vancomycin dosing.  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    Allergies  Allergen Reactions  . Ace Inhibitors   . Ancef [Cefazolin]   . Sulfa Antibiotics   . Vancomycin     Antimicrobials this admission: Vancomycin 5/13 >>   Thank you for allowing pharmacy to be a part of this patient's care.  Murlean Iba, PharmD Candidate 07/30/2017 2:20 PM

## 2017-07-30 NOTE — Consult Note (Addendum)
Reason for Consult:Right hip swelling Referring Physician: R Tamikia Clarke is an 57 y.o. female.  HPI: Crystal Clarke was brought in to the ED with fevers and AMS. She was diagnosed with PNA and admitted by [facility]. She incidentally had some swelling and pain over her right hip and orthopedic surgery was consulted for that. She has had a fluid collection there that's been stable for at least a couple of years. About 2 weeks ago it flared up with increased swelling and redness. She was seen at [facility] and underwent aspiration which showed 34k WBC's but no crystals or organisms and culture was no growth. It has subsequently improved greatly. She notes no pain at rest, minimal pain with palpation, and lots of pruritis. She also notes she woke up with left wrist pain today. No prior hx/o problems with that joint. Admits to hx/o gout but denies flares in last 40 years. Her historical accuracy is somewhat questionable. Much of the above gleaned from [place].  Past Medical History:  Diagnosis Date  . Anemia   . Anxiety   . Arthritis   . Blood transfusion without reported diagnosis   . CHF (congestive heart failure) ([place])   . Chronic kidney disease   . Depression   . GERD (gastroesophageal reflux disease)   . Heart murmur   . Hyperlipidemia   . Hypertension   . Lupus (systemic lupus erythematosus) ([place])   . 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 Myocardial infarction ([place])   . Osteoporosis   . Stroke ([place])   . Thyroid disease     Past Surgical History:  Procedure Laterality Date  . CORONARY ARTERY BYPASS GRAFT    . JOINT REPLACEMENT     right knee, left knee and left hip    No family history on file.  Social History:  reports that she has never smoked. She has never used smokeless tobacco. She reports that she does not drink alcohol or use drugs.  Allergies:  Allergies  Allergen Reactions  . Ace Inhibitors   . Ancef [Cefazolin]   . Sulfa Antibiotics   . Vancomycin      Medications: I have reviewed the patient's current medications.  Results for orders placed or performed during the hospital encounter of 07/30/17 (from the past 48 hour(s))  Comprehensive metabolic panel     Status: Abnormal   Collection Time: 07/30/17 10:37 AM  Result Value Ref Range   Sodium 137 135 - 145 mmol/L   Potassium 3.5 3.5 - 5.1 mmol/L   Chloride 100 (L) 101 - 111 mmol/L   CO2 24 22 - 32 mmol/L   Glucose, Bld 92 65 - 99 mg/dL   BUN 24 (H) 6 - 20 mg/dL   Creatinine, Ser 5.06 (H) 0.44 - 1.00 mg/dL   Calcium 8.0 (L) 8.9 - 10.3 mg/dL   Total Protein 5.3 (L) 6.5 - 8.1 g/dL   Albumin 1.6 (L) 3.5 - 5.0 g/dL   AST 23 15 - 41 U/L   ALT 9 (L) 14 - 54 U/L   Alkaline Phosphatase 92 38 - 126 U/L   Total Bilirubin 0.7 0.3 - 1.2 mg/dL   GFR calc non Af Amer 9 (L) >60 mL/min   GFR calc Af Amer 10 (L) >60 mL/min    Comment: (NOTE) The eGFR has been calculated using the CKD EPI equation. This calculation has not been validated in all clinical situations. eGFR's persistently <60 mL/min signify possible Chronic Kidney Disease.  Anion gap 13 5 - 15    Comment: Performed at [facility] [address] [place], [place] [address]  Lipase, blood     Status: None   Collection Time: 07/30/17 10:37 AM  Result Value Ref Range   Lipase 27 11 - 51 U/L    Comment: Performed at [facility] [address] [place], [place] [address]  I-Stat CG4 Lactic Acid, ED     Status: None   Collection Time: 07/30/17 11:29 AM  Result Value Ref Range   Lactic Acid, Venous 1.13 0.5 - 1.9 mmol/L  I-Stat CG4 Lactic Acid, ED     Status: None   Collection Time: 07/30/17 12:15 PM  Result Value Ref Range   Lactic Acid, Venous 0.92 0.5 - 1.9 mmol/L  Protime-INR     Status: Abnormal   Collection Time: 07/30/17  1:36 PM  Result Value Ref Range   Prothrombin Time 16.1 (H) 11.4 - 15.2 seconds   INR 1.30     Comment: Performed at [facility], [place] [address] [place], [place] [address]     Dg Chest 2 View  Result Date: 07/30/2017 CLINICAL DATA:  Fever this morning to 100.3 degrees. History of previous episodes of sepsis. The patient has dialysis dependent renal failure. The patient is on 2 L of supplemental oxygen. EXAM: CHEST - 2 VIEW COMPARISON:  None in PACs FINDINGS: The lungs are adequately inflated. The lung markings are coarse in the retrocardiac region on the left. There is overlying cardiac monitoring caval and electrode however. There is no pleural effusion. The heart and pulmonary vascularity are normal. There are post CABG changes. There is calcification in the wall of the aortic arch. The dual-lumen dialysis catheter tip projects over the distal third of the SVC. The observed bony thorax is unremarkable. IMPRESSION: Possible subsegmental atelectasis or early pneumonia in the left lower lobe. No CHF or pleural effusion. Followup PA and lateral chest X-ray is recommended in 3-4 weeks following trial of antibiotic therapy to ensure resolution and exclude underlying malignancy. Previous CABG.  Thoracic aortic atherosclerosis. Electronically Signed   By: David  Martinique M.D.   On: 07/30/2017 13:03   Dg Wrist Complete Left  Result Date: 07/30/2017 CLINICAL DATA:  Left wrist pain and swelling status post fall EXAM: LEFT WRIST - COMPLETE 3+ VIEW COMPARISON:  None. FINDINGS: Generalized osteopenia. No acute fracture or dislocation. 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 Mild osteoarthritis of the first [place] joint. Mild osteoarthritis of the radiocarpal joint. 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 IMPRESSION: 1.  No acute osseous injury of the left wrist. Electronically Signed   By: Kathreen Devoid   On: 07/30/2017 13:11   Dg Hip Unilat W Or Wo Pelvis 2-3 Views Right  Result Date: 07/30/2017 CLINICAL DATA:  Multiple right hip surgeries most recently 2 3 weeks ago. History of sepsis in the past and now was febrile to 100.3 degrees. Possible altered mental  status. EXAM: DG HIP (WITH OR WITHOUT PELVIS) 2-3V RIGHT COMPARISON:  There is osteolysis of the right femoral head and neck. The femoral shaft is high riding and displaced laterally. There is radiodense material adjacent to the acetabulum. There is a prosthetic left hip joint. FINDINGS: There is no evidence of hip fracture or dislocation. There is no evidence of arthropathy or other focal bone abnormality. IMPRESSION: Chronic osteolysis of the right hip with no normal appearing femoral head or neck. High positioning of  the femoral shaft and lateral displacement. Correlation with with the patient's clinical history and previous images not available to me is needed. Electronically Signed   By: David  Martinique M.D.   On: 07/30/2017 13:06    Review of Systems  Constitutional: Positive for fever. Negative for weight loss.  HENT: Negative for ear discharge, ear pain, hearing loss and tinnitus.   Eyes: Negative for blurred vision, double vision, photophobia and pain.  Respiratory: Negative for cough, sputum production and shortness of breath.   Cardiovascular: Negative for chest pain.  Gastrointestinal: Negative for abdominal pain, nausea and vomiting.  Genitourinary: Negative for dysuria, flank pain, frequency and urgency.  Musculoskeletal: Positive for joint pain (Right hip and left wrist). Negative for back pain, falls, myalgias and neck pain.  Neurological: Negative for dizziness, tingling, sensory change, focal weakness, loss of consciousness and headaches.  Endo/Heme/Allergies: Does not bruise/bleed easily.  Psychiatric/Behavioral: Negative for depression, memory loss and substance abuse. The patient is not nervous/anxious.    Blood pressure (!) 93/56, pulse 65, temperature 98.6 F (37 C), temperature source Oral, resp. rate 11, height _0  (1.651 m), weight 54.4 kg (120 lb), SpO2 94 %. Physical Exam  Constitutional: She appears well-developed and well-nourished. No distress.  HENT:  Head:  Normocephalic and atraumatic.  Eyes: Conjunctivae are normal. Right eye exhibits no discharge. Left eye exhibits no discharge. No scleral icterus.  Neck: Normal range of motion.  Cardiovascular: Normal rate and regular rhythm.  Respiratory: Effort normal. No respiratory distress.  Musculoskeletal:  Left shoulder, elbow, wrist, digits- no skin wounds, wrist severe TTP w/A/PROM, erythematous, no instability, no blocks to motion except pain  Sens  Ax/Crystal/M/U intact  Mot   Ax/ Crystal/ PIN/ M/ AIN/ U intact  Rad 2+  RLE No traumatic wounds, ecchymosis, or rash  Fluctuant swelling where trochanter would be, minimal TTP, no erythema, no pain with A/PROM  No knee or ankle effusion  Knee stable to varus/ valgus and anterior/posterior stress  Sens DPN, SPN, TN intact  Motor EHL, ext, flex, evers 5/5  DP 1+, PT 1+, No significant edema  Neurological: She is alert.  Skin: Skin is warm and dry. She is not diaphoretic.  Psychiatric: She has a normal mood and affect. Her behavior is normal.    Assessment/Plan: Left wrist pain -- Will ask IR to aspirate joint to Crystal/o sepsis and gout. Could treat with 7.70m toradol if not contraindicated. Would hold off on steroids until septic joint ruled out. Right hip fluid -- I suspect this is a chronic seroma. Given relative lack of symptoms would not risk another aspiration at this point. Should it start to hurt or get erythematous again may need to reassess. PNA -- Per primary    MLisette Abu PA-C Orthopedic Surgery [phone]/13/2019, 3:24 PM

## 2017-07-30 NOTE — ED Provider Notes (Addendum)
[facility] EMERGENCY DEPARTMENT Provider Note   CSN: [id number] Arrival date & time: 07/30/17  1011     History   Chief Complaint Chief Complaint  Patient presents with  . Fever  . Altered Mental Status  . Hypotension    HPI Crystal Clarke is a 57 y.o. female with history of CHF, hypertension, SLE who presents with intermittent fever, altered mental status, hypotension as well as right hip pain and left wrist pain.  She is coming from [facility].  She report reports that she is there for dialysis right now.  She recently had left hip replacement in January by Dr. Starlyn Skeans at [facility].  She reports she last had a surgery on her right hip in 1990.  She has much more swelling in the area than usual.  She denies any specific pain.  She has a muscle flap in that hip and ostial lysis.  She also has left wrist pain.  She reports falling, but she does not remember one.  She has associated swelling and pain.  She has history of gout.  She has been coughing, but denies any shortness of breath or chest pain.  HPI  Past Medical History:  Diagnosis Date  . Anemia   . Anxiety   . Arthritis   . Blood transfusion without reported diagnosis   . CHF (congestive heart failure) ([place])   . Chronic kidney disease   . Depression   . GERD (gastroesophageal reflux disease)   . Heart murmur   . Hyperlipidemia   . Hypertension   . Lupus (systemic lupus erythematosus) ([place])   . 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 Myocardial infarction ([place])   . Osteoporosis   . Stroke ([place])   . Thyroid disease     Patient Active Problem List   Diagnosis Date Noted  . Fever 07/30/2017    Past Surgical History:  Procedure Laterality Date  . CORONARY ARTERY BYPASS GRAFT    . JOINT REPLACEMENT     right knee, left knee and left hip     OB History   None      Home Medications    Prior to Admission medications   Medication Sig Start Date End Date Taking? Authorizing  Provider  ALPRAZolam Duanne Moron) 0.5 MG tablet Take 0.5 mg by mouth 2 (two) times daily as needed.   Yes [provider]  aspirin 81 MG chewable tablet Chew 81 mg by mouth daily.    Yes [provider]  atorvastatin (LIPITOR) 40 MG tablet Take 40 mg by mouth daily.   Yes [provider]  carvedilol (COREG) 6.25 MG tablet Take 6.25 mg by mouth 2 (two) times daily with a meal. For BP greater than 100.   Yes [provider]  cholecalciferol (VITAMIN D) 1000 units tablet Take 1,000 Units by mouth daily.   Yes [provider]  dexlansoprazole (DEXILANT) 60 MG capsule Take 60 mg by mouth daily.   Yes [provider]  diclofenac sodium (VOLTAREN) 1 % GEL Apply topically 4 (four) times daily.   Yes [provider]  epoetin alfa (EPOGEN,PROCRIT) 50037 UNIT/ML injection Inject 20,000 Units into the skin. Every other week   Yes [provider]  ezetimibe (ZETIA) 10 MG tablet Take 10 mg by mouth daily.   Yes [provider]  ferrous sulfate 325 (65 FE) MG tablet Take 325 mg by mouth daily with breakfast.    Yes [provider]  FLUoxetine (PROZAC) 20 MG tablet Take 20 mg by mouth daily.    Yes [provider]  isosorbide mononitrate (IMDUR) 30 MG 24 hr tablet Take 30 mg by mouth 2 (two) times daily.   Yes [provider]  levothyroxine (SYNTHROID, LEVOTHROID) 100 MCG tablet Take 100 mcg by mouth daily. 07/02/17  Yes [provider]  nitroGLYCERIN (NITROSTAT) 0.4 MG SL tablet Place 0.4 mg under the tongue every 5 (five) minutes as needed.   Yes [provider]  oxyCODONE 30 MG 12 hr tablet Take 1 tablet by mouth 2 (two) times daily.   Yes [provider]  Oxycodone HCl 10 MG TABS Take 1 tablet by mouth 2 (two) times daily as needed.   Yes [provider]  predniSONE (DELTASONE) 10 MG tablet Take 10 mg by mouth daily with breakfast.   Yes [provider]  QUEtiapine  Fumarate (SEROQUEL XR) 150 MG 24 hr tablet Take 150 mg by mouth at bedtime.   Yes [provider]  ramelteon (ROZEREM) 8 MG tablet Take 8 mg by mouth daily.   Yes [provider]  ranitidine (ZANTAC) 150 MG capsule Take 150 mg by mouth at bedtime.   Yes [provider]  Sennosides-Docusate Sodium (SENNA PLUS PO) Take 2 tablets by mouth 2 (two) times daily.   Yes [provider]  zolpidem (AMBIEN) 10 MG tablet Take 10 mg by mouth at bedtime as needed.   Yes [provider]    Family History No family history on file.  Social History Social History   Tobacco Use  . Smoking status: Never Smoker  . Smokeless tobacco: Never Used  Substance Use Topics  . Alcohol use: No  . Drug use: No     Allergies   Ace inhibitors; Ancef [cefazolin]; Sulfa antibiotics; and Vancomycin   Review of Systems Review of Systems  Constitutional: Positive for fever. Negative for chills.  HENT: Negative for facial swelling and sore throat.   Respiratory: Positive for cough. Negative for shortness of breath.   Cardiovascular: Negative for chest pain.  Gastrointestinal: Negative for abdominal pain, nausea and vomiting.  Genitourinary: Negative for dysuria.  Musculoskeletal: Positive for arthralgias. Negative for back pain.  Skin: Negative for rash and wound.  Neurological: Negative for headaches.  Psychiatric/Behavioral: The patient is not nervous/anxious.      Physical Exam Updated Vital Signs BP (!) 79/45   Pulse 60   Temp 98.6 F (37 C) (Oral)   Resp (!) 21   Ht 5\' 5"  (1.651 m)   Wt 54.4 kg (120 lb)   SpO2 94%   BMI 19.97 kg/m   Physical Exam  Constitutional: She appears well-developed and well-nourished. No distress.  HENT:  Head: Normocephalic and atraumatic.  Mouth/Throat: Oropharynx is clear and moist. No oropharyngeal exudate.  Eyes: Pupils are equal, round, and reactive to light. Conjunctivae are normal. Right eye exhibits no discharge.  Left eye exhibits no discharge. No scleral icterus.  Neck: Normal range of motion. Neck supple. No thyromegaly present.  Cardiovascular: Normal rate, regular rhythm, normal heart sounds and intact distal pulses. Exam reveals no gallop and no friction rub.  No murmur heard. Pulmonary/Chest: Effort normal and breath sounds normal. No stridor. No respiratory distress. She has no wheezes. She has no rales.  Abdominal: Soft. Bowel sounds are normal. She exhibits no distension. There is no tenderness. There is no rebound and no guarding.  Musculoskeletal: She exhibits no edema.  Edema without tenderness over the right  hip, no significant warmth or erythema, well-healed scar No pain or tenderness over the left hip, no scar visible Some edema and mild tenderness over the left hip with well-healed scar Left wrist pain, swelling, and erythema, tenderness on palpation, range of motion of the fingers intact, range of motion limited of wrist  Lymphadenopathy:    She has no cervical adenopathy.  Neurological: She is alert. Coordination normal.  Skin: Skin is warm and dry. No rash noted. She is not diaphoretic. No pallor.  Psychiatric: She has a normal mood and affect.  Nursing note and vitals reviewed.    ED Treatments / Results  Labs (all labs ordered are listed, but only abnormal results are displayed) Labs Reviewed  COMPREHENSIVE METABOLIC PANEL - Abnormal; Notable for the following components:      Result Value   Chloride 100 (*)    BUN 24 (*)    Creatinine, Ser 5.06 (*)    Calcium 8.0 (*)    Total Protein 5.3 (*)    Albumin 1.6 (*)    ALT 9 (*)    GFR calc non Af Amer 9 (*)    GFR calc Af Amer 10 (*)    All other components within normal limits  PROTIME-INR - Abnormal; Notable for the following components:   Prothrombin Time 16.1 (*)    All other components within normal limits  CBC WITH DIFFERENTIAL/PLATELET - Abnormal; Notable for the following components:   RBC 2.12 (*)     Hemoglobin 5.8 (*)    HCT 19.4 (*)    MCHC 29.9 (*)    RDW 16.7 (*)    All other components within normal limits  CULTURE, BLOOD (ROUTINE X 2)  CULTURE, BLOOD (ROUTINE X 2)  LIPASE, BLOOD  CBC WITH DIFFERENTIAL/PLATELET  URINALYSIS, ROUTINE W REFLEX MICROSCOPIC  I-STAT CG4 LACTIC ACID, ED  I-STAT CG4 LACTIC ACID, ED  PREPARE RBC (CROSSMATCH)  TYPE AND SCREEN    EKG None  Radiology Dg Chest 2 View  Result Date: 07/30/2017 CLINICAL DATA:  Fever this morning to 100.3 degrees. History of previous episodes of sepsis. The patient has dialysis dependent renal failure. The patient is on 2 L of supplemental oxygen. EXAM: CHEST - 2 VIEW COMPARISON:  None in PACs FINDINGS: The lungs are adequately inflated. The lung markings are coarse in the retrocardiac region on the left. There is overlying cardiac monitoring caval and electrode however. There is no pleural effusion. The heart and pulmonary vascularity are normal. There are post CABG changes. There is calcification in the wall of the aortic arch. The dual-lumen dialysis catheter tip projects over the distal third of the SVC. The observed bony thorax is unremarkable. IMPRESSION: Possible subsegmental atelectasis or early pneumonia in the left lower lobe. No CHF or pleural effusion. Followup PA and lateral chest X-ray is recommended in 3-4 weeks following trial of antibiotic therapy to ensure resolution and exclude underlying malignancy. Previous CABG.  Thoracic aortic atherosclerosis. Electronically Signed   By: David  Martinique M.D.   On: 07/30/2017 13:03   Dg Wrist Complete Left  Result Date: 07/30/2017 CLINICAL DATA:  Left wrist pain and swelling status post fall EXAM: LEFT WRIST - COMPLETE 3+ VIEW COMPARISON:  None. FINDINGS: Generalized osteopenia. No acute fracture or dislocation. 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 Mild osteoarthritis of the first [place] joint. Mild osteoarthritis of the radiocarpal joint. 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 IMPRESSION: 1.  No acute osseous injury of the left  wrist. Electronically Signed   By: Kathreen Devoid   On: 07/30/2017 13:11   Ct Head Wo Contrast  Result Date: 07/30/2017 CLINICAL DATA:  Altered level of consciousness. Recent hip surgery. Fever. EXAM: CT HEAD WITHOUT CONTRAST TECHNIQUE: Contiguous axial images were obtained from the base of the skull through the vertex without intravenous contrast. COMPARISON:  None. FINDINGS: Brain: Nonspecific deep white matter hypodensity in the internal capsules and periventricular frontal lobes. No evidence of parenchymal hemorrhage or extra-axial fluid collection. No mass lesion, mass effect, or midline shift. Cerebral volume is age appropriate. No ventriculomegaly. Vascular: No acute abnormality. Skull: No evidence of calvarial fracture. Sinuses/Orbits: The visualized paranasal sinuses are essentially clear. Other:  The mastoid air cells are unopacified. IMPRESSION: 1. Nonspecific deep white matter hypodensity in the internal capsules and periventricular frontal lobes. Differential is broad and includes chronic small vessel ischemia, posterior reversal encephalopathy syndrome, lupus cerebritis and other inflammatory causes. MRI brain without and with IV contrast may be obtained as clinically warranted. 2. No acute intracranial hemorrhage. No mass effect. No hydrocephalus. Electronically Signed   By: Ilona Sorrel M.D.   On: 07/30/2017 15:35   Dg Hip Unilat W Or Wo Pelvis 2-3 Views Right  Result Date: 07/30/2017 CLINICAL DATA:  Multiple right hip surgeries most recently 2 3 weeks ago. History of sepsis in the past and now was febrile to 100.3 degrees. Possible altered mental status. EXAM: DG HIP (WITH OR WITHOUT PELVIS) 2-3V RIGHT COMPARISON:  There is osteolysis of the right femoral head and neck. The femoral shaft is high riding and displaced laterally. There is radiodense material adjacent to the acetabulum. There is a prosthetic  left hip joint. FINDINGS: There is no evidence of hip fracture or dislocation. There is no evidence of arthropathy or other focal bone abnormality. IMPRESSION: Chronic osteolysis of the right hip with no normal appearing femoral head or neck. High positioning of the femoral shaft and lateral displacement. Correlation with with the patient's clinical history and previous images not available to me is needed. Electronically Signed   By: David  Martinique M.D.   On: 07/30/2017 13:06    Procedures Procedures (including critical care time)  CRITICAL CARE Performed by: Frederica Kuster   Total critical care time: 35 minutes  Critical care time was exclusive of separately billable procedures and treating other patients.  Critical care was necessary to treat or prevent imminent or life-threatening deterioration.  Critical care was time spent personally by me on the following activities: development of treatment plan with patient and/or surrogate as well as nursing, discussions with consultants, evaluation of patient's response to treatment, examination of patient, obtaining history from patient or surrogate, ordering and performing treatments and interventions, ordering and review of laboratory studies, ordering and review of radiographic studies, pulse oximetry and re-evaluation of patient's condition.   Medications Ordered in ED Medications  vancomycin (VANCOCIN) 1,250 mg in sodium chloride 0.9 % 250 mL IVPB (1,250 mg Intravenous New Bag/Given 07/30/17 1620)  sodium chloride 0.9 % bolus 500 mL (500 mLs Intravenous New Bag/Given 07/30/17 1627)  0.9 %  sodium chloride infusion (has no administration in time range)  sodium chloride 0.9 % bolus 1,000 mL (0 mLs Intravenous Stopped 07/30/17 1314)  aztreonam (AZACTAM) 2 g in sodium chloride 0.9 % 100 mL IVPB (0 g Intravenous Stopped 07/30/17 1451)     Initial Impression / Assessment and Plan / ED Course  I have reviewed the triage vital signs and the nursing  notes.  Pertinent  labs & imaging results that were available during my care of the patient were reviewed by me and considered in my medical decision making (see chart for details).     Patient presenting with intermittent fever, altered mental status, hypotension.  CBC shows hemoglobin of 5.8, patient's baseline around 7.  She states she has had anemia for years and they cannot figure out where it is coming from.  She declines a DRE/fecal occult.  Will transfuse 2 units.  CMP shows BUN 24, creatinine 5.06, protein 5.3, albumin 1.6.  Lactate negative.  Blood cultures pending. Chest x-ray shows possible early pneumonia.  Patient also has a possible seroma over her right hip and swelling in her left wrist.  Patient evaluated by orthopedic PA, Hilbert Odor, who does not believe the patient needs further intervention on her right hip, but Dr. Grandville Silos will aspirate the left wrist.  Aztreonam and vancomycin initiated for HCAP.  I discussed patient case with Dr. Moshe Cipro with nephrology who is aware of the patient's need for dialysis during admission.  I discussed patient case with resident Mitzi Hansen with internal medicine teaching service who will admit the patient.  I appreciate the above consultants further assistance with this patient.  Patient also evaluated by Dr. Vanita Panda who guided the patient's management and agrees with plan.  Final Clinical Impressions(s) / ED Diagnoses   Final diagnoses:  Healthcare-associated pneumonia  Anemia, unspecified type  Left wrist pain  Hip swelling, right    ED Discharge Orders    None       Caryl Ada 07/30/17 1646    Carmin Muskrat, MD 08/01/17 2150    Frederica Kuster, PA-C 08/12/17 2006    Carmin Muskrat, MD 08/14/17 2204

## 2017-07-30 NOTE — ED Notes (Signed)
Resident paged regarding need for radiology consult in order for wrist aspiration to be done today.

## 2017-07-30 NOTE — Progress Notes (Signed)
Dr. Ronalee Red made aware of SBP 74, new order for NS 500 ml bolus

## 2017-07-30 NOTE — ED Notes (Signed)
Renal diet with fluid restriction dinner tray ordered at this time

## 2017-07-30 NOTE — Patient Outreach (Signed)
[facility]) Care Management  07/30/2017  Ricketta Colantonio 1960/08/10 [id number]   Referral Date:07/18/17 Referral Source:[facility] Dept Referral Reason#1:"member lives alone and expressed concern with [facility] assistance" Referral Reason #2: "pt is 57 yr old female who has had 5 admissions within the past 5 months, was receiving [place] prior to this admission of 07/17/17, currently hospitalized and being treated for NSTEMI and progression of CKD to start dialysis in hospital" Insurance:[employer]  Referral Date: 07/27/17 Referral Source: [facility] UM Dept.  Referral Reason: "This is a 56 yr old mmber s/p NSTEMI and AMS and found down in her home. She has a history of CKD on HD. She lives at home alone and will be going home after SNF. She is currently at [facility] and will be discharging soon, as she will be exhausting all of her SNF days."    RN CM received hospital ADT  that patient admitted to the hospital on 07/30/17.   Plan: RN CM will notify [facility] hospital liaison and request follow up.    Enzo Montgomery, RN,BSN,CCM [facility] Management Telephonic Care Management Coordinator Direct Phone: [phone] Toll Free: [phone] Fax: [phone]

## 2017-07-30 NOTE — ED Notes (Signed)
Pt refusing rectal exam to check for occult stool Unable to send type and screen to lab with [facility] label r/t other staff working on pt profile, sample sent to lab with pt label instead

## 2017-07-30 NOTE — ED Notes (Signed)
ED Provider at bedside. 

## 2017-07-30 NOTE — H&P (Signed)
Date: 07/30/2017               Patient Name:  Crystal Clarke MRN: [id number]  DOB: [birth year] Age / Sex: 57 y.o., female   PCP: Rochel Brome, MD         Medical Service: Internal Medicine Teaching Service         Attending Physician: Dr. Carmin Muskrat, MD    First Contact: Dr. Ronalee Red Pager: [phone]  Second Contact: Dr. Philipp Ovens Pager: [phone]       After Hours (After 5p/  First Contact Pager: [phone]  weekends / holidays): Second Contact Pager: [phone]   Chief Complaint: fever  History of Present Illness:  Ms. Liles is a 57yo female with PMH of CAD s/p CABG, HTN, SLE, Raynaud's, CVA, hypothyroidism, and ESRD on HD TuThSa who presents from [facility] with fever.  She had revision of left total hip arthroplasty in 03/2017. She was recently admitted April-May 2019 for NSTEMI and noted to have a right hip effusion. Aspiration of fluid was done, which showed WBC 34K but no crystals or organisms and negative culture.  She was discharged to SNF and was doing well until 2 days ago when she noted acute onset of left wrist pain and swelling that woke her from sleep. She endorses warmth to the area and denies any trauma or lacerations to the area. She has never had left hand swelling in the past. She thinks the pain feels like gout, which she has had in the past but is not currently on any medication for this. She also endorses subjective fevers/chills. She denies SOB, cough, CP, dysuria, abdominal pain, nausea, or lightheadedness/dizziness.  Last HD session Saturday, no recent missed sessions. Denies tobacco or illicit drug use. Endorses rare alcohol use.  ED Course: - BP 86/48, temp 97.9, RR 14, HR 69, O2 99% on RA - CXR read with possible early pneumonia vs atelectasis in LLL. Left wrist x-ray shows mild osteoarthritis and chondrocalcinosis, no acute fracture. Right hip x-ray with chronic osteolysis of right hip. CT head shows nonspecific deep white matter hypodensity in internal  capsules and periventricular frontal lobes. - CBC with normal WBC, low Hb to 5.8. BMP with Cr 5.06. Lactic acid normal. Lipase normal. BCx pending. - Received vancomycin/aztreonam and 1.5L IV NS bolus  Meds:  Current Meds  Medication Sig  . ALPRAZolam (XANAX) 0.5 MG tablet Take 0.5 mg by mouth 2 (two) times daily as needed.  Marland Kitchen aspirin 81 MG chewable tablet Chew 81 mg by mouth daily.   Marland Kitchen atorvastatin (LIPITOR) 40 MG tablet Take 40 mg by mouth daily.  . carvedilol (COREG) 6.25 MG tablet Take 6.25 mg by mouth 2 (two) times daily with a meal. For BP greater than 100.  . cholecalciferol (VITAMIN D) 1000 units tablet Take 1,000 Units by mouth daily.  Marland Kitchen dexlansoprazole (DEXILANT) 60 MG capsule Take 60 mg by mouth daily.  . diclofenac sodium (VOLTAREN) 1 % GEL Apply topically 4 (four) times daily.  Marland Kitchen epoetin alfa (EPOGEN,PROCRIT) 34193 UNIT/ML injection Inject 20,000 Units into the skin. Every other week  . ezetimibe (ZETIA) 10 MG tablet Take 10 mg by mouth daily.  . ferrous sulfate 325 (65 FE) MG tablet Take 325 mg by mouth daily with breakfast.   . FLUoxetine (PROZAC) 20 MG tablet Take 20 mg by mouth daily.   . isosorbide mononitrate (IMDUR) 30 MG 24 hr tablet Take 30 mg by mouth 2 (two) times daily.  Marland Kitchen levothyroxine (SYNTHROID,  LEVOTHROID) 100 MCG tablet Take 100 mcg by mouth daily.  . nitroGLYCERIN (NITROSTAT) 0.4 MG SL tablet Place 0.4 mg under the tongue every 5 (five) minutes as needed.  Marland Kitchen oxyCODONE 30 MG 12 hr tablet Take 1 tablet by mouth 2 (two) times daily.  . Oxycodone HCl 10 MG TABS Take 1 tablet by mouth 2 (two) times daily as needed.  . predniSONE (DELTASONE) 10 MG tablet Take 10 mg by mouth daily with breakfast.  . QUEtiapine Fumarate (SEROQUEL XR) 150 MG 24 hr tablet Take 150 mg by mouth at bedtime.  . ramelteon (ROZEREM) 8 MG tablet Take 8 mg by mouth daily.  . ranitidine (ZANTAC) 150 MG capsule Take 150 mg by mouth at bedtime.  [name] (SENNA PLUS PO) Take 2  tablets by mouth 2 (two) times daily.  Marland Kitchen zolpidem (AMBIEN) 10 MG tablet Take 10 mg by mouth at bedtime as needed.   Allergies: Allergies as of 07/30/2017 - Review Complete 07/30/2017  Allergen Reaction Noted  . Ace inhibitors  01/04/2017  . Ancef [cefazolin]  01/04/2017  . Sulfa antibiotics  01/04/2017  . Vancomycin  01/04/2017   Past Medical History:  Diagnosis Date  . Anemia   . Anxiety   . Arthritis   . Blood transfusion without reported diagnosis   . CHF (congestive heart failure) ([place])   . Chronic kidney disease   . Depression   . GERD (gastroesophageal reflux disease)   . Heart murmur   . Hyperlipidemia   . Hypertension   . Lupus (systemic lupus erythematosus) ([place])   . 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 Myocardial infarction ([place])   . Osteoporosis   . Stroke ([place])   . Thyroid disease    Family History:  No family history on file.  Social History:  Denies tobacco or illicit drug use. Endorses rare alcohol use. - Currently at [facility], prior to this, she was living at home alone and her next door neighbor would occasionally stop by to check on her - Typically ambulates with a walker  Review of Systems: Constitutional: Positive for fever and fatigue. Negative for diaphoresis HEENT: Negative for sore throat. Respiratory: Negative for cough, shortness of breath and wheezing.   Cardiovascular: Negative for chest pain, palpitations, and leg swelling.  Gastrointestinal: Negative for abdominal pain, nausea and vomiting. Genitourinary: Negative for dysuria. Musculoskeletal: Positive for left hand swelling, pain, and erythema. Positive for right hip pain and swelling (chronic). Neurological: Negative for dizziness, focal weakness, weakness and headaches.   Physical Exam: Blood pressure (!) 93/56, pulse 65, temperature 98.6 F (37 C), temperature source Oral, resp. rate 11, height 5\' 5"  (1.651 m), weight 120 lb (54.4 kg), SpO2 94 %. GEN: Elderly female  appears older than stated age. Alert and oriented. No acute distress.  HENT: [place]/AT. Dry mucous membranes. No visible lesions. EYES: PERRL. Sclera non-icteric. Conjunctiva clear. RESP: No wheezes or rhonchi. Crackles at right base. No increased work of breathing, on RA. CV: Normal rate and regular rhythm. Systolic murmur, best heard at LUSB. Trace LE edema. Indwelling hemodialysis catheter in right anterior chest wall - no surrounding swelling or erythema ABD: Soft. Non-tender. Non-distended. Normoactive bowel sounds. EXT: Trace BLE edema. Right leg shorter than left leg. Vertical well-healed surgical scars on bilateral knees. Right hip with swelling, erythema, and warmth. SKIN: Capillary refill >2sec. No splinter hemorrhages, osler nodes or janeway lesions NEURO: Cranial nerves II-XII grossly intact. Able to lift all four extremities against gravity.  No apparent audiovisual hallucinations. Speech fluent and appropriate. PSYCH: Patient is calm and pleasant. Appropriate affect. Well-groomed; speech is appropriate and on-subject.  Labs CBC Latest Ref Rng & Units 07/30/2017  WBC 4.0 - 10.5 K/uL 7.7  Hemoglobin 12.0 - 15.0 g/dL 5.8(LL)  Hematocrit 36.0 - 46.0 % 19.4(L)  Platelets 150 - 400 K/uL 267   CMP Latest Ref Rng & Units 07/30/2017  Glucose 65 - 99 mg/dL 92  BUN 6 - 20 mg/dL 24(H)  Creatinine 0.44 - 1.00 mg/dL 5.06(H)  Sodium 135 - 145 mmol/L 137  Potassium 3.5 - 5.1 mmol/L 3.5  Chloride 101 - 111 mmol/L 100(L)  CO2 22 - 32 mmol/L 24  Calcium 8.9 - 10.3 mg/dL 8.0(L)  Total Protein 6.5 - 8.1 g/dL 5.3(L)  Total Bilirubin 0.3 - 1.2 mg/dL 0.7  Alkaline Phos 38 - 126 U/L 92  AST 15 - 41 U/L 23  ALT 14 - 54 U/L 9(L)   Lactic acid 1.13 Lipase 27 INR 1.30 BCx pending  EKG: personally reviewed my interpretation is sinus rhythm, PAC, no ST elevation, T wave inversion in V4/V5? No prior for comparison  CXR Possible subsegmental atelectasis or early pneumonia in the left lower lobe. No  CHF or pleural effusion. Followup PA and lateral chest X-ray is recommended in 3-4 weeks following trial of antibiotic therapy to ensure resolution and exclude underlying malignancy. Previous CABG.  Thoracic aortic atherosclerosis.  Left wrist x-ray No acute osseous injury of the left wrist.  Right hip x-ray Chronic osteolysis of the right hip with no normal appearing femoral head or neck. High positioning of the femoral shaft and lateral displacement. Correlation with with the patient's clinical history and previous images not available to me is needed.  CT head 1. Nonspecific deep white matter hypodensity in the internal capsules and periventricular frontal lobes. Differential is broad and includes chronic small vessel ischemia, posterior reversal encephalopathy syndrome, lupus cerebritis and other inflammatory causes. MRI brain without and with IV contrast may be obtained as clinically warranted. 2. No acute intracranial hemorrhage. No mass effect. No hydrocephalus.  Assessment & Plan by Problem: Active Problems:   * No active hospital problems. *  Ms. Branscom is a 57yo female with PMH of CAD s/p CABG, HTN, SLE, Raynaud's, CVA, hypothyroidism, and ESRD on HD TuThSa who presents from [facility] with reported fever and acute onset of left wrist swelling, pain, and erythema. Differential for monoarticular arthritis includes septic arthritis vs pseudogout vs gout vs lupus. IR has been consulted to assist with aspiration of synovial fluid to further evaluate.  Left wrist swelling Acute onset. Patient reports a hx of gout, but not currently on any medication for this. She does have a hx of lupus and is not currently on any medications, but has been on steroids and plaquenil in the past. She denies a history of trauma to the area. 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 Will further assess with synovial fluid aspiration. - IR and Orthopedics consulted;  appreciate their recommendations - F/u synovial fluid analysis - Continue vancomycin - Holding off on steroids until septic arthritis has been ruled out  Reported fever, hypotension Afebrile here, but she is noted to be asymptomatically hypotensive to 80s/50s. Other vitals are stable. Intermittent fevers could be secondary to SLE vs possible septic arthritis of left wrist or other infection. No urinary symptoms. CXR was read as possible pneumonia in LLL and she was started on vanc/aztreonam for HCAP, however pulmonary  exam was unremarkable, and patient is not complaining of any respiratory symptoms, thus will discontinue HCAP coverage. She also has an indwelling hemodialysis catheter - does not appear infected, but could be another source for fevers. Patient does have a murmur on exam, but she reports that she has a known murmur. - Monitor fever curve - S/p 1.5L IVF bolus - Continue vancomycin - Receiving 1u pRBC transfusion - F/u BCx - UA - HIV Ab screen  Hx of recurrent avascular necrosis Hx of septic right hip joint Hx of left revision total hip arthroplasty, B/L knee replacement Has undergone surgical resection of right femoral head. Right femoral shaft held in place with muscle flap. CT right hip on 5/3 showed large peripherally enhancing fluid collection surrounding greater trochanter of femur and extending along acetabular fossa into iliopsoas tendon. During admission 4-07/2017, IR performed aspiration of right hip joint, which showed 34K WBC and no organisms or crystals. Culture negative at that time. No antibiotics were given and no further intervention was recommended. Orthopedics has been consulted and feel this is related to a seroma and there is no need for intervention at this point. - Continue to monitor - Holding home oxycontin 30mg  q12h PRN for pain 2/2 hypotension - CT of right hip  Acute anemia Hb 5.8 on admission (baseline ~7). Unclear etiology for her acute anemia. Patient  declined rectal exam and FOBT. - 1u pRBC transfusion ordered - Post-transfusion H&H - CBC in AM - Peripheral blood smear - Continue home ferrous sulfate 325mg  daily - CT of right hip - Continue protonix 40mg  daily  CAD s/p CABG Hx of HTN No chest pain. Recent admission for NSTEMI at [facility]. - Continue aspirin 81mg  daily - Holding home clopidogrel 75mg  daily - Holding home carvedilol 6.25mg  BID 2/2 hypotension - Continue home atorvastatin 40mg  daily  Hx of SLE She has undergone multiple therapies, including extended courses of steroids and immunomodulatory therapy. Not currently on any therapy. Nephrology consulted from recent admission and did not want to treat with immunosuppression given her hx of multiple complicated infections. 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 Last session Saturday - reports no missed sessions. - Nephrology consulted for inpatient HD; appreciate their assistance - RFP in AM  Hx of hypothyroidism TSH normal in 03/2017 - Continue levothyroxine 79mcg daily  Diet: Renal diet - 1200cc fluid restriction VTE PPx: SCDs Code Status: DNR/DNI Dispo: Admit patient to Inpatient with expected length of stay greater than 2 midnights.  Signed: Colbert Ewing, MD 07/30/2017, 2:55 PM  Pager: Mamie Nick [phone]

## 2017-07-30 NOTE — ED Triage Notes (Signed)
PT arrives via EMS from [facility] assisted living. PT had right hip surgery 2-3 weeks ago and was febrile this AM at 100.3. NH reported AMS to EMS, but EMS reports PT answered questions appropriately. PT has been septic in the past. PT dialyses and last went to treatment Saturday. PT complains of left wrist pain and swelling today. PT wears 2L O2 via [place] at home.

## 2017-07-30 NOTE — ED Notes (Signed)
DNR band placed on right wrist

## 2017-07-31 ENCOUNTER — Inpatient Hospital Stay (HOSPITAL_COMMUNITY): Payer: PPO

## 2017-07-31 DIAGNOSIS — E039 Hypothyroidism, unspecified: Secondary | ICD-10-CM

## 2017-07-31 DIAGNOSIS — Z7902 Long term (current) use of antithrombotics/antiplatelets: Secondary | ICD-10-CM

## 2017-07-31 DIAGNOSIS — Z96642 Presence of left artificial hip joint: Secondary | ICD-10-CM

## 2017-07-31 DIAGNOSIS — R509 Fever, unspecified: Secondary | ICD-10-CM

## 2017-07-31 DIAGNOSIS — I2510 Atherosclerotic heart disease of native coronary artery without angina pectoris: Secondary | ICD-10-CM

## 2017-07-31 DIAGNOSIS — Z8739 Personal history of other diseases of the musculoskeletal system and connective tissue: Secondary | ICD-10-CM

## 2017-07-31 DIAGNOSIS — N186 End stage renal disease: Secondary | ICD-10-CM

## 2017-07-31 DIAGNOSIS — Z951 Presence of aortocoronary bypass graft: Secondary | ICD-10-CM

## 2017-07-31 DIAGNOSIS — R011 Cardiac murmur, unspecified: Secondary | ICD-10-CM

## 2017-07-31 DIAGNOSIS — Z79899 Other long term (current) drug therapy: Secondary | ICD-10-CM

## 2017-07-31 DIAGNOSIS — M659 Synovitis and tenosynovitis, unspecified: Secondary | ICD-10-CM

## 2017-07-31 DIAGNOSIS — M3214 Glomerular disease in systemic lupus erythematosus: Principal | ICD-10-CM

## 2017-07-31 DIAGNOSIS — M25451 Effusion, right hip: Secondary | ICD-10-CM

## 2017-07-31 DIAGNOSIS — I252 Old myocardial infarction: Secondary | ICD-10-CM

## 2017-07-31 DIAGNOSIS — Z8673 Personal history of transient ischemic attack (TIA), and cerebral infarction without residual deficits: Secondary | ICD-10-CM

## 2017-07-31 DIAGNOSIS — I959 Hypotension, unspecified: Secondary | ICD-10-CM

## 2017-07-31 DIAGNOSIS — Z79890 Hormone replacement therapy (postmenopausal): Secondary | ICD-10-CM

## 2017-07-31 DIAGNOSIS — I7300 Raynaud's syndrome without gangrene: Secondary | ICD-10-CM

## 2017-07-31 DIAGNOSIS — D631 Anemia in chronic kidney disease: Secondary | ICD-10-CM

## 2017-07-31 DIAGNOSIS — M25532 Pain in left wrist: Secondary | ICD-10-CM

## 2017-07-31 DIAGNOSIS — J9811 Atelectasis: Secondary | ICD-10-CM

## 2017-07-31 DIAGNOSIS — M879 Osteonecrosis, unspecified: Secondary | ICD-10-CM

## 2017-07-31 DIAGNOSIS — R2233 Localized swelling, mass and lump, upper limb, bilateral: Secondary | ICD-10-CM

## 2017-07-31 DIAGNOSIS — Z9889 Other specified postprocedural states: Secondary | ICD-10-CM

## 2017-07-31 DIAGNOSIS — Z96653 Presence of artificial knee joint, bilateral: Secondary | ICD-10-CM

## 2017-07-31 DIAGNOSIS — Z992 Dependence on renal dialysis: Secondary | ICD-10-CM

## 2017-07-31 DIAGNOSIS — D649 Anemia, unspecified: Secondary | ICD-10-CM

## 2017-07-31 DIAGNOSIS — I120 Hypertensive chronic kidney disease with stage 5 chronic kidney disease or end stage renal disease: Secondary | ICD-10-CM

## 2017-07-31 DIAGNOSIS — Z7982 Long term (current) use of aspirin: Secondary | ICD-10-CM

## 2017-07-31 LAB — CBC
HEMATOCRIT: 28.2 % — AB (ref 36.0–46.0)
Hemoglobin: 8.8 g/dL — ABNORMAL LOW (ref 12.0–15.0)
MCH: 27.6 pg (ref 26.0–34.0)
MCHC: 31.2 g/dL (ref 30.0–36.0)
MCV: 88.4 fL (ref 78.0–100.0)
Platelets: 246 10*3/uL (ref 150–400)
RBC: 3.19 MIL/uL — AB (ref 3.87–5.11)
RDW: 18.2 % — AB (ref 11.5–15.5)
WBC: 5.6 10*3/uL (ref 4.0–10.5)

## 2017-07-31 LAB — RENAL FUNCTION PANEL
ANION GAP: 12 (ref 5–15)
Albumin: 1.5 g/dL — ABNORMAL LOW (ref 3.5–5.0)
BUN: 30 mg/dL — ABNORMAL HIGH (ref 6–20)
CHLORIDE: 105 mmol/L (ref 101–111)
CO2: 22 mmol/L (ref 22–32)
Calcium: 7.8 mg/dL — ABNORMAL LOW (ref 8.9–10.3)
Creatinine, Ser: 4.8 mg/dL — ABNORMAL HIGH (ref 0.44–1.00)
GFR, EST AFRICAN AMERICAN: 11 mL/min — AB (ref >60)
GFR, EST NON AFRICAN AMERICAN: 9 mL/min — AB (ref >60)
Glucose, Bld: 87 mg/dL (ref 65–99)
POTASSIUM: 3.7 mmol/L (ref 3.5–5.1)
Phosphorus: 5.1 mg/dL — ABNORMAL HIGH (ref 2.5–4.6)
Sodium: 139 mmol/L (ref 135–145)

## 2017-07-31 LAB — LACTATE DEHYDROGENASE: LDH: 227 U/L — ABNORMAL HIGH (ref 98–192)

## 2017-07-31 LAB — SYNOVIAL CELL COUNT + DIFF, W/ CRYSTALS
Crystals, Fluid: NONE SEEN
EOSINOPHILS-SYNOVIAL: UNDETERMINED % (ref 0–1)
Lymphocytes-Synovial Fld: UNDETERMINED % (ref 0–20)
Monocyte-Macrophage-Synovial Fluid: UNDETERMINED % (ref 50–90)
Neutrophil, Synovial: UNDETERMINED % (ref 0–25)
Other Cells-SYN: UNDETERMINED
WBC, SYNOVIAL: UNDETERMINED /mm3 (ref 0–200)

## 2017-07-31 LAB — HIV ANTIBODY (ROUTINE TESTING W REFLEX): HIV SCREEN 4TH GENERATION: NONREACTIVE

## 2017-07-31 LAB — CORTISOL: Cortisol, Plasma: 14.4 ug/dL

## 2017-07-31 LAB — MRSA PCR SCREENING: MRSA BY PCR: NEGATIVE

## 2017-07-31 MED ORDER — DARBEPOETIN ALFA 100 MCG/0.5ML IJ SOSY: 100.0000 ug | PREFILLED_SYRINGE | INTRAMUSCULAR | Status: DC

## 2017-07-31 MED ORDER — LIDOCAINE HCL (PF) 1 % IJ SOLN
INTRAMUSCULAR | Status: AC
  Filled 2017-07-31: qty 10

## 2017-07-31 MED ORDER — VANCOMYCIN HCL 500 MG IV SOLR
500.0000 mg | INTRAVENOUS | Status: DC
  Filled 2017-07-31: qty 500

## 2017-07-31 MED ORDER — IOPAMIDOL (ISOVUE-M 200) INJECTION 41%: 20.0000 mL | Freq: Once | INTRAMUSCULAR | Status: DC

## 2017-07-31 MED ORDER — LIDOCAINE HCL (PF) 1 % IJ SOLN: 5.0000 mL | Freq: Once | INTRAMUSCULAR | Status: DC

## 2017-07-31 MED ORDER — PRO-STAT SUGAR FREE PO LIQD
30.0000 mL | Freq: Two times a day (BID) | ORAL | Status: DC
  Administered 2017-07-31 – 2017-08-02 (×4): 30 mL via ORAL
  Filled 2017-07-31 (×5): qty 30

## 2017-07-31 MED ORDER — LEVOTHYROXINE SODIUM 100 MCG PO TABS
100.0000 ug | ORAL_TABLET | Freq: Every day | ORAL | Status: DC
  Administered 2017-07-31 – 2017-08-01 (×2): 100 ug via ORAL
  Filled 2017-07-31 (×2): qty 1

## 2017-07-31 MED ORDER — IOPAMIDOL (ISOVUE-M 200) INJECTION 41%
INTRAMUSCULAR | Status: AC
  Filled 2017-07-31: qty 10

## 2017-07-31 NOTE — NC FL2 (Signed)
[place] LEVEL OF CARE SCREENING TOOL     IDENTIFICATION  Patient Name: Crystal Clarke Birthdate: 1960/05/03 Sex: female Admission Date (Current Location): 07/30/2017  [facility] and [place] Number:  [employer] and Address:  [facility] [place], [place] [address], [place], [place] [address]      Provider Number: [id number]  Attending Physician Name and Address:  Oval Linsey, MD  Relative Name and Phone Number:       Current Level of Care: Hospital Recommended Level of Care: [place] Prior Approval Number:    Date Approved/Denied:   PASRR Number: [id number]  Discharge Plan: SNF    Current Diagnoses: Patient Active Problem List   Diagnosis Date Noted  . Fever 07/30/2017  . Anemia 07/30/2017    Orientation RESPIRATION BLADDER Height & Weight     Self, Situation, Time, Place(with episodes of confusion)  Normal Continent Weight: 120 lb (54.4 kg) Height:  5\' 5"  (165.1 cm)  BEHAVIORAL SYMPTOMS/MOOD NEUROLOGICAL BOWEL NUTRITION STATUS      Continent Diet(see discharge summary)  AMBULATORY STATUS COMMUNICATION OF NEEDS Skin   Extensive Assist Verbally Other (Comment)(erythema; swelling in R hip and L wrist)                       Personal Care Assistance Level of Assistance  Bathing, Feeding, Dressing Bathing Assistance: Limited assistance Feeding assistance: Independent Dressing Assistance: Limited assistance     Functional Limitations Info  Sight, Hearing, Speech Sight Info: Adequate Hearing Info: Adequate Speech Info: Impaired(slurred)    SPECIAL CARE FACTORS FREQUENCY  PT (By licensed PT), OT (By licensed OT)     PT Frequency: 5x week OT Frequency: 5x week            Contractures Contractures Info: Not present    Additional Factors Info  Code Status, Allergies, Psychotropic Code Status Info: DNR Allergies Info: ACE INHIBITORS, ANCEF CEFAZOLIN, SULFA ANTIBIOTICS, VANCOMYCIN  Psychotropic  Info: QUEtiapine (SEROQUEL XR) 24 hr tablet 150 mg PO at bedtime         Current Medications (07/31/2017):  This is the current hospital active medication list Current Facility-Administered Medications  Medication Dose Route Frequency Provider Last Rate Last Dose  . iopamidol (ISOVUE-M) 41 % intrathecal injection           . lidocaine (PF) (XYLOCAINE) 1 % injection           . acetaminophen (TYLENOL) tablet 650 mg  650 mg Oral Q6H PRN Colbert Ewing, MD      . aspirin chewable tablet 81 mg  81 mg Oral Daily Jule Ser, DO   81 mg at 07/31/17 0347  . atorvastatin (LIPITOR) tablet 40 mg  40 mg Oral Daily Jule Ser, DO   40 mg at 07/31/17 0940  . cholecalciferol (VITAMIN D) tablet 1,000 Units  1,000 Units Oral Daily Jule Ser, DO   1,000 Units at 07/31/17 0940  . diclofenac sodium (VOLTAREN) 1 % transdermal gel 2 g  2 g Topical TID PRN Colbert Ewing, MD      . ezetimibe (ZETIA) tablet 10 mg  10 mg Oral Daily Jule Ser, DO   10 mg at 07/31/17 0940  . ferrous sulfate tablet 325 mg  325 mg Oral Q breakfast Jule Ser, DO      . FLUoxetine (PROZAC) capsule 20 mg  20 mg Oral Daily Oval Linsey, MD   20 mg at 07/30/17 2205  . levothyroxine (SYNTHROID, LEVOTHROID) tablet  100 mcg  100 mcg Oral QAC breakfast Colbert Ewing, MD      . pantoprazole (PROTONIX) EC tablet 40 mg  40 mg Oral Daily Jule Ser, DO   40 mg at 07/31/17 0940  . QUEtiapine (SEROQUEL XR) 24 hr tablet 150 mg  150 mg Oral QHS Jule Ser, DO   150 mg at 07/30/17 2206     Discharge Medications: Please see discharge summary for a list of discharge medications.  Relevant Imaging Results:  Relevant Lab Results:   Additional Information SS# [place], [place]

## 2017-07-31 NOTE — Consult Note (Addendum)
   [facility] CM Inpatient Consult   07/31/2017  Crystal Clarke [birth year] [id number]   Made aware of [facility] from RN Telephonic from insurance/UM referral. Patient is currently active with [facility] for chronic disease management services.  Patient has [employer]vantage. Patient has had attempted recent unsuccessful outreaches by a [place] [patient was likely at [facility] .  Patient had previously been active with [facility] RN [facility]. Will follow for progress and disposition needs with inpatient care management staff.   Primary Care Provider: Dirk Dress, MD   Of note, [facility] Care Management services does not replace or interfere with any services that are needed or arranged by inpatient case management or social work.  For additional questions or referrals please contact:   Natividad Brood, RN BSN [facility] Liaison  [phone] business mobile phone Toll free office [phone]

## 2017-07-31 NOTE — Clinical Social Work Note (Signed)
Clinical Social Work Assessment  Patient Details  Name: Crystal Clarke MRN: [id number] Date of Birth: 17-Jun-1960  Date of referral:  07/31/17               Reason for consult:  Discharge Planning, [employer]                Permission sought to share information with:  Facility Sport and exercise psychologist, Family Supports Permission granted to share information::  Yes, Verbal Permission Granted  Name::        Agency::     Relationship::   ex husband and friend  Contact Information:     Housing/Transportation Living arrangements for the past 2 months:  [place], [place] of Information:  Patient, Other (Comment Required)(other ) Patient Interpreter Needed:  None Criminal Activity/Legal Involvement Pertinent to Current Situation/Hospitalization:  No - Comment as needed Significant Relationships:  Other(Comment) Lives with:  Self, Pets Do you feel safe going back to the place where you live?  Yes Need for family participation in patient care:  Yes (Comment)  Care giving concerns: Pt is new dialysis pt, has multiple care needs and has had hospitalizations and SNF stays as she gets nephrology concerns sorted.   Social Worker assessment / plan:  CSW met with pt and pt ex husband who is still a friend at bedside. Pt states that she is from [place] area and most recently has been at [facility], because she is connected with a dialysis center in [facility] where she can be transported to. Pt states that her new journey with dialysis has been frustrating and she would like to be looped in with [facility] and clipped to a center closer to home in [place]. Pt has been pleased with her stay at [facility] and if she stays at her current dialysis center is okay with returning, but understands that there may have to be a switch to an [facility] facility if her center changes.   Pt and pt ex husband had many questions about dialysis. CSW encouraged pt to  bring forth those concerns with nephrology team and assured pt that [facility] would follow along to support disposition.   Employment status:  Disabled (Comment on whether or not currently receiving Disability) Insurance information:  Programmer, applications PT Recommendations:  40 Hour Supervision, [place] / Referral to community resources:  [place]  Patient/Family's Response to care:  Pt and pt friend are very overwhelmed by care journey, appreciative for CSW visit and assistance with disposition.   Patient/Family's Understanding of and Emotional Response to Diagnosis, Current Treatment, and Prognosis: Pt and pt friend state some understanding of diagnosis, current treatment and prognosis. There is definite confusion about care plans moving forward and pt care future, as evidenced by pt's extensive questions and frustration with moving from facility to hospital to facility. CSW provided emotional support and will continue to follow.   Emotional Assessment Appearance:    Attitude/Demeanor/Rapport:  Engaged, Gracious Affect (typically observed):  Accepting, Adaptable, Pleasant, Appropriate, Overwhelmed Orientation:  Oriented to Self, Oriented to Place, Oriented to  Time, Oriented to Situation Alcohol / Substance use:  Not Applicable Psych involvement (Current and /or in the community):  No (Comment)  Discharge Needs  Concerns to be addressed:  Adjustment to Illness, Care Coordination, Discharge Planning Concerns Readmission within the last 30 days:    Current discharge risk:    Barriers to Discharge:  Ship broker, Continued Medical Work up  Alexander Mt, LCSWA 07/31/2017, 5:21 PM

## 2017-07-31 NOTE — Progress Notes (Signed)
Internal Medicine Attending  Date: 07/31/2017  Patient name: Crystal Clarke Medical record number: [id number] Date of birth: May 09, 1960 Age: 57 y.o. Gender: female  I saw and evaluated the patient. I reviewed the resident's note by Dr. Ronalee Red and I agree with the resident's findings and plans as documented in her progress note.  Please see my H&P dated 07/31/2017 for the specifics of my evaluation, assessment, and plan from earlier in the day.

## 2017-07-31 NOTE — Consult Note (Addendum)
[facility] Renal Consultation Note    Indication for Consultation:  Management of ESRD/hemodialysis; anemia, hypertension/volume and secondary hyperparathyroidism PCP:  HPI: Crystal Clarke is a 57 y.o. female with ESRD secondary to lupus nephritis who started hemodialysis 07/18/2017 at [facility]. [place] significant for SLE, CAD, CABG, CHF, CVA, AKI, acute encephalopathy, recurrent avascular necrosis and recurrent sepsis R hip joint, hypothyroidism, Raynaud's disease, renal cancer, AOCD, SHPT. She has dialysis at [facility], nephrologist is Dr. Dimas Aguas.  Patient was admitted to [facility] 04/12/2017 for acute encephalopathy, aspiration PNA , AKI and dislocated L hip. s/p closed L hip reduction 04/14/2017. She was discharged to [facility]. She was supposed to start HD over a month ago but this was delayed D/T repeated infections and pain management. Ex-husband is POA.    She presented to ED 07/30/2017 from [facility] with C/O fever, pain and swelling L wrist. Upon arrival to ED, Temperature was 97.9 WBC 7.7 Lactic acid 1.13 HGB 5.8, K+ 3.5 Scr 5.06 BUN 24 EGFR 9. CXR showed Possible  pneumonia in the left lower lobe. Xray L wrist no acute osseous injury of the left wrist.CT of head without acute abnormality, CT of R hip showed large fluid collection surrounding the proximal femur concerning for septic joint effusion. She has been admitted per primary for fever, anemia, L wrist swelling.   Spoke with patient and ex-husband who is her primary care giver.  She is currently at [facility] for rehab and getting dialysis at [facility] with long term nephrologist, however plans to transfer to [facility] with [facility].. Currently she says she feels better, L wrist not as painful as it was yesterday, feels stronger since transfusion. Denies chest pain, SOB at present. Per husband, had recent MI one week ago. Patient and ex-husband understandably frustrated with incidents of infections  and recurrent hospitalizations. Hoping to get some answers here. No evidence of volume overload, K+ controlled. Tearful, trying to stand up to get her pants on-    Past Medical History:  Diagnosis Date  . Anemia   . Anxiety   . Arthritis   . Blood transfusion without reported diagnosis   . CHF (congestive heart failure) ([place])   . Chronic kidney disease   . Depression   . GERD (gastroesophageal reflux disease)   . Heart murmur   . Hyperlipidemia   . Hypertension   . Lupus (systemic lupus erythematosus) ([place])   . Lupus nephritis, ISN/RPS class IV ([place])   . Myocardial infarction ([place])   . Osteoporosis   . Stroke ([place])   . Thyroid disease    Past Surgical History:  Procedure Laterality Date  . CORONARY ARTERY BYPASS GRAFT    . JOINT REPLACEMENT     right knee, left knee and left hip   No family history on file. Social History:  reports that she has never smoked. She has never used smokeless tobacco. She reports that she does not drink alcohol or use drugs. Allergies  Allergen Reactions  . Ace Inhibitors   . Ancef [Cefazolin]   . Sulfa Antibiotics   . Vancomycin    Prior to Admission medications   Medication Sig Start Date End Date Taking? Authorizing Provider  ALPRAZolam Duanne Moron) 0.5 MG tablet Take 0.5 mg by mouth 2 (two) times daily as needed.   Yes [provider]  aspirin 81 MG chewable tablet Chew 81 mg by mouth daily.    Yes [provider]  atorvastatin (LIPITOR) 40 MG tablet Take 40 mg  by mouth daily.   Yes [provider]  carvedilol (COREG) 6.25 MG tablet Take 6.25 mg by mouth 2 (two) times daily with a meal. For BP greater than 100.   Yes [provider]  cholecalciferol (VITAMIN D) 1000 units tablet Take 1,000 Units by mouth daily.   Yes [provider]  dexlansoprazole (DEXILANT) 60 MG capsule Take 60 mg by mouth daily.   Yes [provider]  diclofenac sodium (VOLTAREN) 1 % GEL Apply topically 4 (four) times  daily.   Yes [provider]  epoetin alfa (EPOGEN,PROCRIT) 03704 UNIT/ML injection Inject 20,000 Units into the skin. Every other week   Yes [provider]  ezetimibe (ZETIA) 10 MG tablet Take 10 mg by mouth daily.   Yes [provider]  ferrous sulfate 325 (65 FE) MG tablet Take 325 mg by mouth daily with breakfast.    Yes [provider]  FLUoxetine (PROZAC) 20 MG tablet Take 20 mg by mouth daily.    Yes [provider]  isosorbide mononitrate (IMDUR) 30 MG 24 hr tablet Take 30 mg by mouth 2 (two) times daily.   Yes [provider]  levothyroxine (SYNTHROID, LEVOTHROID) 100 MCG tablet Take 100 mcg by mouth daily. 07/02/17  Yes [provider]  nitroGLYCERIN (NITROSTAT) 0.4 MG SL tablet Place 0.4 mg under the tongue every 5 (five) minutes as needed.   Yes [provider]  oxyCODONE 30 MG 12 hr tablet Take 1 tablet by mouth 2 (two) times daily.   Yes [provider]  Oxycodone HCl 10 MG TABS Take 1 tablet by mouth 2 (two) times daily as needed.   Yes [provider]  predniSONE (DELTASONE) 10 MG tablet Take 10 mg by mouth daily with breakfast.   Yes [provider]  QUEtiapine Fumarate (SEROQUEL XR) 150 MG 24 hr tablet Take 150 mg by mouth at bedtime.   Yes [provider]  ramelteon (ROZEREM) 8 MG tablet Take 8 mg by mouth daily.   Yes [provider]  ranitidine (ZANTAC) 150 MG capsule Take 150 mg by mouth at bedtime.   Yes [provider]  Sennosides-Docusate Sodium (SENNA PLUS PO) Take 2 tablets by mouth 2 (two) times daily.   Yes [provider]  zolpidem (AMBIEN) 10 MG tablet Take 10 mg by mouth at bedtime as needed.   Yes [provider]   Current Facility-Administered Medications  Medication Dose Route Frequency Provider Last Rate Last Dose  . acetaminophen (TYLENOL) tablet 650 mg  650 mg Oral Q6H PRN Colbert Ewing, MD      . aspirin chewable  tablet 81 mg  81 mg Oral Daily Jule Ser, DO   81 mg at 07/31/17 8889  . atorvastatin (LIPITOR) tablet 40 mg  40 mg Oral Daily Jule Ser, DO   40 mg at 07/31/17 0940  . cholecalciferol (VITAMIN D) tablet 1,000 Units  1,000 Units Oral Daily Jule Ser, DO   1,000 Units at 07/31/17 0940  . diclofenac sodium (VOLTAREN) 1 % transdermal gel 2 g  2 g Topical TID PRN Colbert Ewing, MD      . ezetimibe (ZETIA) tablet 10 mg  10 mg Oral Daily Jule Ser, DO   10 mg at 07/31/17 0940  . ferrous sulfate tablet 325 mg  325 mg Oral Q breakfast Jule Ser, DO      . FLUoxetine (PROZAC) capsule 20 mg  20 mg Oral Daily Oval Linsey, MD   20 mg  at 07/30/17 2205  . levothyroxine (SYNTHROID, LEVOTHROID) tablet 100 mcg  100 mcg Oral QAC breakfast Colbert Ewing, MD      . pantoprazole (PROTONIX) EC tablet 40 mg  40 mg Oral Daily Jule Ser, DO   40 mg at 07/31/17 0940  . QUEtiapine (SEROQUEL XR) 24 hr tablet 150 mg  150 mg Oral QHS Jule Ser, DO   150 mg at 07/30/17 2206   Labs: Basic Metabolic Panel: Recent Labs  Lab 07/30/17 1037 07/31/17 0400  NA 137 139  K 3.5 3.7  CL 100* 105  CO2 24 22  GLUCOSE 92 87  BUN 24* 30*  CREATININE 5.06* 4.80*  CALCIUM 8.0* 7.8*  PHOS  --  5.1*   Liver Function Tests: Recent Labs  Lab 07/30/17 1037 07/31/17 0400  AST 23  --   ALT 9*  --   ALKPHOS 92  --   BILITOT 0.7  --   PROT 5.3*  --   ALBUMIN 1.6* 1.5*   Recent Labs  Lab 07/30/17 1037  LIPASE 27   No results for input(s): AMMONIA in the last 168 hours. CBC: Recent Labs  Lab 07/30/17 1336 07/31/17 0400  WBC 7.7 5.6  NEUTROABS 5.1  --   HGB 5.8* 8.8*  HCT 19.4* 28.2*  MCV 91.5 88.4  PLT 267 246   Cardiac Enzymes: No results for input(s): CKTOTAL, CKMB, CKMBINDEX, TROPONINI in the last 168 hours. CBG: No results for input(s): GLUCAP in the last 168 hours. Iron Studies: No results for input(s): IRON, TIBC, TRANSFERRIN, FERRITIN in the last 72  hours. Studies/Results: Dg Chest 2 View  Result Date: 07/30/2017 CLINICAL DATA:  Fever this morning to 100.3 degrees. History of previous episodes of sepsis. The patient has dialysis dependent renal failure. The patient is on 2 L of supplemental oxygen. EXAM: CHEST - 2 VIEW COMPARISON:  None in PACs FINDINGS: The lungs are adequately inflated. The lung markings are coarse in the retrocardiac region on the left. There is overlying cardiac monitoring caval and electrode however. There is no pleural effusion. The heart and pulmonary vascularity are normal. There are post CABG changes. There is calcification in the wall of the aortic arch. The dual-lumen dialysis catheter tip projects over the distal third of the SVC. The observed bony thorax is unremarkable. IMPRESSION: Possible subsegmental atelectasis or early pneumonia in the left lower lobe. No CHF or pleural effusion. Followup PA and lateral chest X-ray is recommended in 3-4 weeks following trial of antibiotic therapy to ensure resolution and exclude underlying malignancy. Previous CABG.  Thoracic aortic atherosclerosis. Electronically Signed   By: David  Martinique M.D.   On: 07/30/2017 13:03   Dg Wrist Complete Left  Result Date: 07/30/2017 CLINICAL DATA:  Left wrist pain and swelling status post fall EXAM: LEFT WRIST - COMPLETE 3+ VIEW COMPARISON:  None. FINDINGS: Generalized osteopenia. No acute fracture or dislocation. 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 Mild osteoarthritis of the first [place] joint. Mild osteoarthritis of the radiocarpal joint. 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 IMPRESSION: 1.  No acute osseous injury of the left wrist. Electronically Signed   By: Kathreen Devoid   On: 07/30/2017 13:11   Ct Head Wo Contrast  Result Date: 07/30/2017 CLINICAL DATA:  Altered level of consciousness. Recent hip surgery. Fever. EXAM: CT HEAD WITHOUT CONTRAST TECHNIQUE: Contiguous axial images were obtained from the base  of the skull through the vertex without intravenous contrast. COMPARISON:  None. FINDINGS: Brain: Nonspecific  deep white matter hypodensity in the internal capsules and periventricular frontal lobes. No evidence of parenchymal hemorrhage or extra-axial fluid collection. No mass lesion, mass effect, or midline shift. Cerebral volume is age appropriate. No ventriculomegaly. Vascular: No acute abnormality. Skull: No evidence of calvarial fracture. Sinuses/Orbits: The visualized paranasal sinuses are essentially clear. Other:  The mastoid air cells are unopacified. IMPRESSION: 1. Nonspecific deep white matter hypodensity in the internal capsules and periventricular frontal lobes. Differential is broad and includes chronic small vessel ischemia, posterior reversal encephalopathy syndrome, lupus cerebritis and other inflammatory causes. MRI brain without and with IV contrast may be obtained as clinically warranted. 2. No acute intracranial hemorrhage. No mass effect. No hydrocephalus. Electronically Signed   By: Ilona Sorrel M.D.   On: 07/30/2017 15:35   Ct Hip Right Wo Contrast  Result Date: 07/31/2017 CLINICAL DATA:  Subacute onset of worsening right hip pain, with right hip joint effusion. EXAM: CT OF THE RIGHT HIP WITHOUT CONTRAST TECHNIQUE: Multidetector CT imaging of the right hip was performed according to the standard protocol. Multiplanar CT image reconstructions were also generated. COMPARISON:  Right hip radiographs performed 07/30/2017 FINDINGS: Bones/Joint/Cartilage As described on recent radiograph, there is chronic osteolysis of the right hip, with resorption of the right femoral head and neck. There is superior displacement of the remaining proximal right femur. A large fluid collection is noted surrounding the proximal right femur, measuring approximately 18 x 13 x 12 cm. Peripheral soft tissue thickening is noted, concerning for a septic effusion. The attenuation suggests against hematoma. The  effusion extends about the remaining flattened right acetabular fossa. There is no evidence of acute fracture. Ligaments Suboptimally assessed by CT. Muscles and Tendons There is diffuse atrophy of much of the musculature of the proximal right thigh. Tendon structures are not well characterized. Soft tissues Overlying diffuse soft tissue edema and skin thickening are noted along the proximal right thigh. The visualized portions of the abdomen are grossly unremarkable. IMPRESSION: 1. Chronic osteolysis of the right hip, with resorption of the right femoral head and neck. Superior displacement of the remaining proximal right femur. 2. Large fluid collection surrounding the proximal right femur, measuring approximately 18 x 13 x 12 cm. Peripheral soft tissue thickening noted, concerning for a septic joint effusion. No definite evidence of hematoma. 3. Diffuse soft tissue edema and skin thickening along the proximal right thigh. Electronically Signed   By: Garald Balding M.D.   On: 07/31/2017 06:06   Dg Hip Unilat W Or Wo Pelvis 2-3 Views Right  Result Date: 07/30/2017 CLINICAL DATA:  Multiple right hip surgeries most recently 2 3 weeks ago. History of sepsis in the past and now was febrile to 100.3 degrees. Possible altered mental status. EXAM: DG HIP (WITH OR WITHOUT PELVIS) 2-3V RIGHT COMPARISON:  There is osteolysis of the right femoral head and neck. The femoral shaft is high riding and displaced laterally. There is radiodense material adjacent to the acetabulum. There is a prosthetic left hip joint. FINDINGS: There is no evidence of hip fracture or dislocation. There is no evidence of arthropathy or other focal bone abnormality. IMPRESSION: Chronic osteolysis of the right hip with no normal appearing femoral head or neck. High positioning of the femoral shaft and lateral displacement. Correlation with with the patient's clinical history and previous images not available to me is needed. Electronically Signed    By: David  Martinique M.D.   On: 07/30/2017 13:06    ROS: As per HPI otherwise negative.  Physical Exam: Vitals:   07/30/17 2300 07/31/17 0045 07/31/17 0521 07/31/17 0521  BP: (!) 95/52 (!) 92/53 (!) 90/47 (!) 90/47  Pulse: 63 66 72 67  Resp: '16 16 15 16  ' Temp: 98 F (36.7 C) 98.1 F (36.7 C) 98.7 F (37.1 C) 98.7 F (37.1 C)  TempSrc: Oral Oral Oral Oral  SpO2: 94% 95% 98% 94%  Weight:      Height:         General: Frail appearing female, looks older than stated age, in no acute distress. Head: Normocephalic, atraumatic, sclera non-icteric, mucus membranes are moist Neck: Supple. JVD not elevated. Lungs: Clear bilaterally to auscultation without wheezes, rales, or rhonchi. Breathing is unlabored. Heart: RRR with S1 S2. 2/6 systolic M. No rubs, or gallops appreciated. Abdomen: Soft, non-tender, non-distended with normoactive bowel sounds. No rebound/guarding. No obvious abdominal masses. Lower extremities:without edema or ischemic changes, no open wounds  Neuro: Alert and oriented X 3. Moves all extremities spontaneously. Psych:  Responds to questions appropriately with a normal affect. Dialysis Access: [facility] drsg CDI. No erythema at exit site visible.   Dialysis Orders: [facility], T,Th,S 3 hrs F180 300/500 2.0 K/2.5 Ca  -Heparin initial bolus 2000 units IV with 500 units hourly-total heparin dose 3000 units.  -Mircera 50 mcg IV q 2 weeks.   Assessment/Plan: 1.  Fever/Hypotension: Large number of sources including possible lupus flare, TDC, PNA and septic joint. Vanc/Azactam per primary. BC NG X 24 hrs. Needs ID consult possibly  2.  L wrist pain: Seen by Ortho. Recommend aspiration by IR to R/O sepsis and gout. Per primary  3.  Fluid collection R HipH/O recurrent sepsis/avascula necrosis R hip: Seen by Ortho. Though to be chronic seroma. Per primary 4.  SLE-currently not on prednisone/plaquenil. Usually [facility]ology at [facility]. Per primary 5.  ESRD -  T,Th, S via [facility]  TDC. K+ 3.7 Use 4.0 K bath. Usual heparin dose.  Will need to bump to do off schedule here tomorrow  6.  Hypertension/volume  - BP very soft. Currently under OP EDW 54.4 kg. Doubt we will be able to remove any volume. No evidence of volume overload.  7.  Anemia  - HGB 5.8 on admission. Has rec'd 2 units PRBCs- felt better after, HGB 8.8 today. Follow HGB. Pt refused FOB exam. Give Aranesp 100 mcg IV with HD tomorrow.  8.  Metabolic bone disease - Ca 7.8 C Ca 9.8 Phos 5.1. No binders, VDRA on OP med list. Monitor labs for now.  9.  Nutrition - Albumin 1.5. Severe PCM. Renal diet, prostat,  10.  H/O CAD, CABG.   Rita H. Owens Shark, NP-C 07/31/2017, 1:26 PM  [employer] [phone]  Patient seen and examined, agree with above note with above modifications. Young woman but with complicated recent history including getting initiated on HD- currently at the [facility] unit with Dr. Jesse Sans-  Plan on HD tomorrow off schedule- likely needs longer time but HD related numbers look OK- other issues per primary- pt takes a lot of time  Corliss Parish, MD 07/31/2017

## 2017-07-31 NOTE — Social Work (Signed)
CSW aware pt arrived to hospital from [facility] will continue to follow for disposition.   Alexander Mt, [place] Work [phone]

## 2017-07-31 NOTE — Progress Notes (Signed)
Pharmacy Antibiotic Note  Crystal Clarke is a 57 y.o. female admitted on 07/30/2017 with possible septic joint; with fever and hypotension. Pharmacy has been consulted for vancomycin dosing.  Patient is ESRD, normally on HD TTSat. Due to scheduling conflicts, patient will be going off-schedule to HD tomorrow Wed the 15th.  Patient has history of red man's syndrome > infusion time was extended to address the allergy  Plan: Vancomycin 500mg  with HD on 5/15 x1 Will follow up for HD schedule/tolerance for further dosing Follow clinical progression, c/s, LOT  Height: 5\' 5"  (165.1 cm) Weight: 120 lb (54.4 kg) IBW/kg (Calculated) : 57  Temp (24hrs), Avg:98.2 F (36.8 C), Min:98 F (36.7 C), Max:98.7 F (37.1 C)  Recent Labs  Lab 07/30/17 1037 07/30/17 1129 07/30/17 1215 07/30/17 1336 07/31/17 0400  WBC  --   --   --  7.7 5.6  CREATININE 5.06*  --   --   --  4.80*  LATICACIDVEN  --  1.13 0.92  --   --     Estimated Creatinine Clearance: 11 mL/min (A) (by C-G formula based on SCr of 4.8 mg/dL (H)).    Allergies  Allergen Reactions  . Ace Inhibitors   . Ancef [Cefazolin]   . Sulfa Antibiotics   . Vancomycin     Hx Red Man Syndrome    Vancomycin 5/13 >>   5/14 MRSA PCR: neg 5/13 BCx: ngtd 5/14 synovial fluid, L wrist:   Thank you for allowing pharmacy to be a part of this patient's care. Carolin Quang D. Nechama Escutia, PharmD, [place] Clinical Pharmacist [phone] 07/31/2017 5:24 PM

## 2017-07-31 NOTE — H&P (Signed)
Internal Medicine Attending Admission Note Date: 07/31/2017  Patient name: Crystal Clarke Medical record number: [id number] Date of birth: 1960-05-31 Age: 57 y.o. Gender: female  I saw and evaluated the patient. I reviewed the resident's note and I agree with the resident's findings and plan as documented in the resident's note.  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 This was associated with a fever.  When asked if she had a similar episode she stated it was like her prior lupus flares. Although her history is somewhat suspect, she states she has not been in any therapy for her lupus in quite some time. This includes prednisone. She also has not had any lupus flares in quite some time.  She reports a history of gout in the past and this pain is similar to that as well.  When seen on rounds the morning after admission she noted slight improvement in her left wrist pain, but new swelling and stiffness in her right hand, again similar to her prior lupus flares per her report. She's had no further fevers while an inpatient. She was without other acute complaints.  Physical Exam - key components related to admission:  Vitals:   07/31/17 0045 07/31/17 0521 07/31/17 0521 07/31/17 1349  BP: (!) 92/53 (!) 90/47 (!) 90/47 (P) 106/81  Pulse: 66 72 67   Resp: 16 15 16    Temp: 98.1 F (36.7 C) 98.7 F (37.1 C) 98.7 F (37.1 C) (P) 98.3 F (36.8 C)  TempSrc: Oral Oral Oral (P) Oral  SpO2: 95% 98%  94% (P) 97%  Weight:      Height:       Gen.: Well-developed, well-nourished, woman lying comfortably in bed with her left wrist propped up on a pillow in no acute distress. She appears much older than her stated age. Left wrist: Warm, mildly swollen, and erythematous. There is tenderness to palpation. Right hand: Mild warmth, erythema, and swelling. There is synovitis of the PIP joints.  Lab results:  Basic Metabolic Panel: Recent Labs    07/30/17 1037 07/31/17 0400  NA 137 139  K 3.5 3.7  CL 100* 105  CO2 24 22  GLUCOSE 92 87  BUN 24* 30*  CREATININE 5.06* 4.80*  CALCIUM 8.0* 7.8*  PHOS  --  5.1*   Liver Function Tests: Recent Labs    07/30/17 1037 07/31/17 0400  AST 23  --   ALT 9*  --   ALKPHOS 92  --   BILITOT 0.7  --   PROT 5.3*  --   ALBUMIN 1.6* 1.5*   Recent Labs    07/30/17 1037  LIPASE 27   CBC: Recent Labs    07/30/17 1336 07/31/17 0400  WBC 7.7 5.6  NEUTROABS 5.1  --   HGB 5.8* 8.8*  HCT 19.4* 28.2*  MCV 91.5 88.4  PLT 267 246   Coagulation: Recent Labs    07/30/17 1336  INR 1.30   Misc. Labs:  Blood cultures 2 no growth to date. Lactic acid 1.13 -> 0.92 HIV nonreactive Random cortisol 14.4  Imaging results:  PA and lateral chest x-ray: Personally reviewed. No effusions or masses, hemodialysis catheter in place. Possible left lower lobe anterior subsegmental atelectasis. No comparisons available.  CT scan of the head without contrast: Personally reviewed. Nonspecific deep white matter hypodensity in the periventricular frontal lobes. No acute bleed.  Left wrist x-ray: Personally reviewed. Chondrocalcinosis versus subarticular sclerosis.  Pelvic x-ray: Personally reviewed.  Status post left total hip arthroplasty, status post right femoral head resection versus oseolysis of the right femoral head.  Right hip CT scan: Personally reviewed. Large fluid correction collection surrounding the proximal right femur.  Other  results:  EKG: Personally reviewed. Normal sinus rhythm at 71 bpm, premature atrial contraction, normal axis, normal intervals, no significant Q waves, no LVH by voltage, delayed R wave progression, no ST segment changes, lateral T wave inversions in a strain pattern. No comparisons immediately available.  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  The cause of the wrist swelling is unclear but possibilities include a septic arthritis, a lupus flare, a gouty attack, or pseudogout given the x-ray. The cause of the fever also remains unclear and may be secondary to a SLE flare, septic arthritis if she has this, an infected hemodialysis catheter, and possibly a left lower lobe anterior subsegmental pneumonia. Pending the aspiration of the left wrist I favor a lupus flare as the cause of the fever and the wrist pain. This would be a unifying diagnosis and also explain the increased swelling and discomfort on the right hand that developed overnight. That said, this could also be consistent with septic arthritis which is now polyarticular rather than monoarticular. This latter possibility should be definitively ruled in or out with the results of the left wrist aspiration.  The cause of the left lower lobe anterior subsegmental atelectasis is unclear but doubtful that this is a focal pneumonia at the subsegmental level. That said, radiographic follow-up with a CXR would be prudent.  1) Left wrist swelling and pain:  We appreciate interventional radiology tapping the joint  to assess the fluid for possible septic arthritis. The results of this are pending at this time. We have empirically treated her with vancomycin since admission. If this is not consistent with septic arthritis or a crystal arthropathy we will consider the possibility that she may have a lupus flare if no other explanation avails itself.  2) Left lower lobe anterior subsegmental atelectasis: We will provide her with an incentive spirometor while in the hospital. She will need a repeat chest x-ray in 4-6 weeks to assure resolution We are not providing her with antibiotics at this time as clinically we do not believe she has a pneumonia.  3) Anemia: Her chronic anemia is related to her chronic renal insufficiency. The acute drop in her hemoglobin is without explanation although she had ovalocytes and teardrop cells on her smear. This could be consistent with hemolytic anemia and/or myelofibrosis.  We will obtain a haptoglobin and LDH to assess for hemolysis although the validity of such after 2 units of packed red blood cells is unclear.  4) Disposition:  Pending the results of the left wrist aspiration.

## 2017-07-31 NOTE — Progress Notes (Signed)
Subjective:  Crystal Clarke was lying in bed comfortably this morning. She denied subjective fevers/chills this AM. She continues to have swelling and pain in her left wrist, but today she noted new onset of increased swelling and pain in her right wrist. She does have a hx of lupus, but has not been on prednisone in over a year. She also reports that does have joint pains from the lupus during her flares, which may be similar to her current symptoms. Denies lightheadedness/dizziness. Denies melena, hematochezia, or hematuria. Patient amenable to FOBT today. She would like to know when her dialysis is and discussed plan to aspirate left wrist today.  Objective:  Vital signs in last 24 hours: Vitals:   07/30/17 2300 07/31/17 0045 07/31/17 0521 07/31/17 0521  BP: (!) 95/52 (!) 92/53 (!) 90/47 (!) 90/47  Pulse: 63 66 72 67  Resp: 16 16 15 16   Temp: 98 F (36.7 C) 98.1 F (36.7 C) 98.7 F (37.1 C) 98.7 F (37.1 C)  TempSrc: Oral Oral Oral Oral  SpO2: 94% 95% 98% 94%  Weight:      Height:       GEN: Elderly female, appears older than stated age, lying in bed in NAD CV: NR & RR, systolic murmur, best heard at LUSB. Trace LE edema. Indwelling hemodialysis catheter in right anterior chest wall without surrounding swelling or erythema EXT: Trace BLE edema, right leg shorter than left leg. Right hip with persistent swelling, erythema, and warmth. Left wrist with swelling, erythema, and pain. Right wrist with swelling and pain. Able to move wrists but limited movement.  Assessment/Plan:  Principal Problem:   Fever Active Problems:   Anemia  Crystal Clarke is a 57yo female with PMH of CAD s/p CABG, HTN, SLE, Raynaud's, CVA, hypothyroidism, and ESRD on HD TuThSa who presents from [facility] with reported fever and acute onset of left wrist swelling, pain, and erythema. Differential for monoarticular arthritis includes septic arthritis vs pseudogout vs gout vs lupus. IR has been consulted to  assist with aspiration of synovial fluid to further evaluate.  Left wrist swelling New right wrist swelling Reports new onset of right wrist swelling this morning. With multiple areas of arthralgia, I favor lupus as the etiology with migratory arthralgias. However, with hx of reported fever and hypotension, will need to rule out septic arthritis and possible crystalopathies. IR has been consulted to assist with aspiration. - IR and Orthopedics consulted; appreciate their recommendations - F/u synovial fluid analysis - Continue vancomycin - Holding off on steroids until septic arthritis has been ruled out - Voltaren gel TID PRN, tylenol PRN  Reported fever, hypotension Afebrile here, but she is noted to be asymptomatically hypotensive to 90s/50s. Other vitals are stable. Intermittent fevers could be secondary to SLE vs possible septic arthritis. Alternative explanation for hypotension could be adrenal insufficiency given her chronic use of steroids for lupus, however she reports she has not been on steroids in over a year and her sBP was 140s-150s during her recent admission. No urinary symptoms and CXR read as possible pneumonia, however she does not have any respiratory complaints and I think the finding may be more consistent with subsegmental collapse. Currently on Vanc and will continue to monitor for signs of clinical worsening. Patient does have a murmur on exam, but she reports that she has a known murmur. - Monitor fever curve - Continue vancomycin - F/u BCx - HIV Ab screen - Random cortisol - F/u synovial fluid analysis -  Incentive spirometer  Hx of recurrent avascular necrosis Hx of septic right hip joint Hx of left revision total hip arthroplasty, B/L knee replacement Repeat CT of right hip shows persistent 18x13x12cm fluid collection. Discussed this finding with Orthopedics, who do not feel that this is consistent with septic arthritis of right hip as she has appropriate range  of motion and non-tender on exam. No need for intervention at this time. - Continue to monitor - Holding home oxycontin 30mg  q12h PRN for pain 2/2 hypotension  Acute anemia Hb 5.8 on admission (baseline ~7), improved to 8.8 s/p 2u pRBC transfusion. Unclear etiology for her acute anemia. Patient denies hematochezia, melena, or hematuria. Peripheral blood smear shows teardrop cells and elliptocytes. Bilirubin is normal. CT of right hip without evidence of hematoma. - CBC in AM - FOBT - Continue home ferrous sulfate 325mg  daily - Continue protonix 40mg  daily  CAD s/p CABG Hx of HTN No chest pain. Recent admission for NSTEMI at [facility]. - Continue aspirin 81mg  daily - Holding home clopidogrel 75mg  daily 2/2 anemia - Holding home carvedilol 6.25mg  BID 2/2 hypotension - Continue home atorvastatin 40mg  daily  Hx of SLE She has undergone multiple therapies, including extended courses of steroids and immunomodulatory therapy. Not currently on any therapy. Last course of steroids was over a year ago. Currently holding treatment due to concern for its immunosuppressive effects in the setting of multiple recent complicated infections. 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 Last session Saturday - reports no missed sessions. - Nephrology consulted for inpatient HD; appreciate their assistance - RFP in AM  Hx of hypothyroidism TSH normal in 03/2017 - Continue levothyroxine 10mcg daily  Dispo: Anticipated discharge in approximately 2-3 day(s).   Colbert Ewing, MD 07/31/2017, 6:33 AM Pager: Mamie Nick [phone]

## 2017-08-01 ENCOUNTER — Other Ambulatory Visit: Payer: Self-pay | Admitting: *Deleted

## 2017-08-01 DIAGNOSIS — M25532 Pain in left wrist: Secondary | ICD-10-CM

## 2017-08-01 DIAGNOSIS — M25531 Pain in right wrist: Secondary | ICD-10-CM

## 2017-08-01 LAB — CBC
HEMATOCRIT: 28 % — AB (ref 36.0–46.0)
HEMOGLOBIN: 8.5 g/dL — AB (ref 12.0–15.0)
MCH: 27.2 pg (ref 26.0–34.0)
MCHC: 30.4 g/dL (ref 30.0–36.0)
MCV: 89.5 fL (ref 78.0–100.0)
Platelets: 246 10*3/uL (ref 150–400)
RBC: 3.13 MIL/uL — AB (ref 3.87–5.11)
RDW: 17.8 % — ABNORMAL HIGH (ref 11.5–15.5)
WBC: 6.1 10*3/uL (ref 4.0–10.5)

## 2017-08-01 LAB — HAPTOGLOBIN: HAPTOGLOBIN: 537 mg/dL — AB (ref 34–200)

## 2017-08-01 MED ORDER — LEVOTHYROXINE SODIUM 75 MCG PO TABS
75.0000 ug | ORAL_TABLET | Freq: Every day | ORAL | Status: DC
  Administered 2017-08-02: 75 ug via ORAL
  Filled 2017-08-01: qty 1

## 2017-08-01 MED ORDER — DARBEPOETIN ALFA 100 MCG/0.5ML IJ SOSY
100.0000 ug | PREFILLED_SYRINGE | INTRAMUSCULAR | Status: DC
  Filled 2017-08-01: qty 0.5

## 2017-08-01 NOTE — Patient Outreach (Signed)
[facility]) Care Management  08/01/2017  Crystal Clarke [birth year] [id number]   CSW was scheduled to meet with patient at [facility] today, [place] where patient was residing to receive short-term rehabilitative services; however, CSW noted that patient has been hospitalized.  Patient was admitted into [facility] on Monday, Jul 30, 2017 with [facility].  CSW will continue to follow patient while hospitalized, then resume community case management services once patient is discharged back to the skilled nursing facility.   Nat Christen, BSW, MSW, LCSW  Licensed Education officer, [facility]  Mailing [place] [address], [place], [place] [address] Physical Address-[address], [place], [place] [address] Toll Free Main # [phone] Fax # [phone] Cell # [phone]  Office # [phone] [email or web address]

## 2017-08-01 NOTE — Progress Notes (Signed)
Internal Medicine Attending  Date: 08/01/2017  Patient name: Lyrique Hakim Medical record number: [id number] Date of birth: 11/08/1960 Age: 57 y.o. Gender: female  I saw and evaluated the patient. I reviewed the resident's note by Dr. Ronalee Red and I agree with the resident's findings and plans as documented in her progress note.  When seen on rounds this morning Ms. Gick noted some improvement in both her left and right wrist and hand discomfort.  Only small amount of fluid was aspirated from the left wrist, but it was negative for white cells and crystals.  She has had no fevers since admission to the hospital and her blood cultures have remained no growth to date.  We have stopped the vancomycin as we have no source for this preadmission fever. Her hypotension is felt to be secondary to being below her estimated dry weight with hemodialysis.  I am leaning towards her articular symptoms being secondary to a lupus flare. If she continues to slowly improve we may not be pushed to start prednisone and plaquanel, and instead can refer her back to her rheumatologist who knows her well. I anticipate she will be ready for discharge home in the next 24-48 hours if we get no new leads as to the cause of her symptoms.

## 2017-08-01 NOTE — Progress Notes (Signed)
Subjective:  Crystal Clarke was lying in bed this morning. She denied subjective fevers/chills. She reports that her swelling and pain in bilateral wrists is about the same. She denies lightheadedness/dizzines. No rashes or other skin lesions. Discussed plan to hold antibiotics today to see how she does clinically. HD today. She reports some bilateral mid-back pain, likely from lying in bed. Encouraged her to get up/sit up as able.  Objective:  Vital signs in last 24 hours: Vitals:   07/31/17 0521 07/31/17 1349 07/31/17 2104 08/01/17 0531  BP: (!) 90/47 106/81 (!) 107/59 (!) 80/57  Pulse: 67  64 66  Resp: 16  16 18   Temp: 98.7 F (37.1 C) 98.3 F (36.8 C) 98.8 F (37.1 C) 98.5 F (36.9 C)  TempSrc: Oral Oral Oral Oral  SpO2: 94% 97% 99% 97%  Weight:      Height:       GEN: Elderly female, appears older than stated age, lying in bed in NAD CV: NR & RR, systolic murmur, best heard at LUSB. No LE edema. Indwelling hemodialysis catheter in right anterior chest wall without surrounding swelling or erythema EXT: Trace BLE edema, right leg shorter than left leg. Right hip with persistent swelling, erythema, and warmth. Left wrist with swelling, and pain. Right wrist with swelling and pain. Able to move wrists but limited movement.  Assessment/Plan:  Principal Problem:   Fever Active Problems:   Anemia   Left wrist pain  Crystal Clarke is a 57yo female with PMH of CAD s/p CABG, HTN, SLE, Raynaud's, CVA, hypothyroidism, and ESRD on HD TuThSa who presents from [facility] with reported fever and acute onset of left and right wrist swelling, pain, and erythema. Aspiration yesterday only yielded 0.5cc of fluid, unable to complete full analysis, but no WBC or organisms seen on culture and no crystals. Differential for polyarticular arthritis may be secondary to lupus, although her diagnosis is not clear-cut although she had reported kidney biopsy consistent with lupus  nephritis.  Polyarticular arthralgia Left and right wrist swelling. With multiple areas of arthralgia, I favor lupus as the etiology with migratory arthralgias. No crystals or organisms seen on synovial fluid analysis from left wrist. BCx and fluid culture thus far are negative x24 hours. If BCx negative x48h, will consider course of steroids for treatment of lupus flare. - IR and Orthopedics consulted; appreciate their recommendations - F/u synovial fluid cultures -> NG x24h - Holding off on steroids - Voltaren gel TID PRN, tylenol PRN - PT/OT - Up in chair as able  Reported fever x1, hypotension Afebrile here, but she is noted to be asymptomatically hypotensive to 90s/50s. Other vitals are stable. Intermittent fevers could be secondary to SLE. She has several other sources for fever, including TDC and possible PNA (based on CXR on admission), however she does not give [place] any other indications that she has an infection. Patient does have a murmur on exam, but she reports that she has a known murmur. Random cortisol not consistent with adrenal insufficiency. Her hypotension may be a result of recent initiation of HD and being below her dry weight. She was being treated with Vanc empirically, but given that we have no identifiable source, we will hold Vanc and monitor for signs of clinical worsening.  - Monitor fever curve - D/c vanc - F/u BCx -> NG x24h - Incentive spirometer  Hx of recurrent avascular necrosis Hx of septic right hip joint Hx of left revision total hip arthroplasty, B/L knee  replacement Repeat CT of right hip shows persistent 18x13x12cm fluid collection. Discussed with Orthopedics, no need for intervention at this time. - Continue to monitor - Holding home oxycontin 30mg  q12h PRN for pain 2/2 hypotension  Acute anemia Hb 5.8 on admission (baseline ~7), improved to 8.8 s/p 2u pRBC transfusion. Stable at 8.5 this AM. Unclear etiology for her acute anemia. Patient denies  hematochezia, melena, or hematuria. Peripheral blood smear shows teardrop cells and elliptocytes. Haptoglobin and LDH are both elevated, which points away from hemolytic anemia and more towards an inflammatory process. - CBC in AM - FOBT - Continue home ferrous sulfate 325mg  daily - Continue protonix 40mg  daily  CAD s/p CABG Hx of HTN No chest pain. Recent admission for NSTEMI at [facility] - Continue aspirin 81mg  daily - Holding home clopidogrel 75mg  daily 2/2 anemia - Holding home carvedilol 6.25mg  BID 2/2 hypotension - Continue home atorvastatin 40mg  daily  Hx of SLE Sees Rheumatology at [facility]. Her diagnosis is not clearcut, she had many antibodies drawn at most recent visit in 03/2017, all of which were negative. However, she had a renal biopsy that showed lupus nephritis. She has previously been treated with 2 courses of cytoxan, prednisone, cell-cept, and plaquenil. Last course of steroids was reported to be over a year ago. Currently not on any treatment per discussions between Nephrology and Rheumatology due to concern for its immunosuppressive effects in the setting of multiple recent complicated infections. - May need course of steroids for possible lupus flare, will follow up BCx prior to initiating this  ESRD 2/2 lupus nephritis, on HD Recently started on HD during admission in April 2019. Last session Saturday - reports no missed sessions. May be the reason for her hypotension. - Nephrology consulted for inpatient HD; appreciate their assistance - HD today  Hx of hypothyroidism TSH normal in 03/2017 - Continue levothyroxine 64mcg daily  Dispo: Anticipated discharge in approximately 1-2 day(s).   Colbert Ewing, MD 08/01/2017, 7:21 AM Pager: Mamie Nick [phone]

## 2017-08-01 NOTE — Consult Note (Signed)
[facility] CM Inpatient Consult   08/01/2017  Crystal Clarke 1960/07/05 [id number]  Met with the patient at the bedside.  Patient is in agreement for [facility] Management to ongoing follow up.  Patient admitted from [facility] where she was receiving therapy and dialysis.  Patient states, "I just got word that [facility] has packed up my things and I guess I won't be going back there.  I just know that I would like to find a chair [dialysis] in [place] where I live.  I was set up at [facility] in [facility].  I know things would be better for me if I get my dialysis in [place].   Patient denies that she would have issues with getting to her dialysis.  She state that she has an ex-husband [POA] and "my next door neighbor is able to help me.  My sister can help too.  I just don't want to be a burden on them." She denies issues with obtaining food when she returns home.  She states she would like therapy and was hoping to continue therapy at a facility but she states that it may be a problem.  Patient is in agreement for [facility] Management to continue to follow up.  [facility] social worker was to follow at skilled facility.  Active consent form on file. Richard remains her contact and POA. Will update information for referral needs to [facility] Management team for disposition and needs.  Patient states she feels she will need an aide for bathing and all.  "I'm just waiting here to see what's next."    Will follow for disposition and needs.  For questions, please contact:  Natividad Brood, RN BSN [facility] Liaison  [phone] business mobile phone Toll free office [phone]

## 2017-08-01 NOTE — Progress Notes (Signed)
Subjective:  Due for HD today - no new c/o's Objective Vital signs in last 24 hours: Vitals:   07/31/17 0521 07/31/17 1349 07/31/17 2104 08/01/17 0531  BP: (!) 90/47 106/81 (!) 107/59 (!) 80/57  Pulse: 67  64 66  Resp: 16  16 18   Temp: 98.7 F (37.1 C) 98.3 F (36.8 C) 98.8 F (37.1 C) 98.5 F (36.9 C)  TempSrc: Oral Oral Oral Oral  SpO2: 94% 97% 99% 97%  Weight:      Height:       Weight change:   Intake/Output Summary (Last 24 hours) at 08/01/2017 1013 Last data filed at 08/01/2017 0932 Gross per 24 hour  Intake 300 ml  Output 600 ml  Net -300 ml    Assessment/Plan: 1.  Fever/Hypotension: Large number of sources including possible lupus flare, TDC, PNA and septic joint. Vanc per primary. BC NG X 24 hrs. Needs ID consult possibly  2.  L wrist pain: Seen by Ortho. Recommend aspiration by IR to R/O sepsis and gout. Per primary - asp did not yield enough fluid for eval 3.   SLE-currently not on prednisone/plaquenil. Usually followed by rheumatology at [facility]. Per primary 4.  ESRD -  T,Th, S via RIJ TDC. K+ 3.7 Use 4.0 K bath. Usual heparin dose.  Run today off schedule 5.  Hypertension/volume  - BP very soft. Currently under OP EDW 54.4 kg. Doubt we will be able to remove any volume. No evidence of volume overload.  6.  Anemia  - HGB 5.8 on admission. Has rec'd 2 units PRBCs- felt better after, HGB 8.8 today. Follow HGB. Pt refused FOB exam. Give Aranesp 100 mcg IV with HD today.  7.  Metabolic bone disease - Ca 7.8 C Ca 9.8 Phos 5.1. No binders, VDRA on OP med list. Monitor labs for now.  8.  Nutrition - Albumin 1.5. Severe PCM. Renal diet, prostat,  9.  H/O CAD, CABG.   Crystal Clarke A    Labs: Basic Metabolic Panel: Recent Labs  Lab 07/30/17 1037 07/31/17 0400  NA 137 139  K 3.5 3.7  CL 100* 105  CO2 24 22  GLUCOSE 92 87  BUN 24* 30*  CREATININE 5.06* 4.80*  CALCIUM 8.0* 7.8*  PHOS  --  5.1*   Liver Function Tests: Recent Labs  Lab 07/30/17 1037  07/31/17 0400  AST 23  --   ALT 9*  --   ALKPHOS 92  --   BILITOT 0.7  --   PROT 5.3*  --   ALBUMIN 1.6* 1.5*   Recent Labs  Lab 07/30/17 1037  LIPASE 27   No results for input(s): AMMONIA in the last 168 hours. CBC: Recent Labs  Lab 07/30/17 1336 07/31/17 0400 08/01/17 0424  WBC 7.7 5.6 6.1  NEUTROABS 5.1  --   --   HGB 5.8* 8.8* 8.5*  HCT 19.4* 28.2* 28.0*  MCV 91.5 88.4 89.5  PLT 267 246 246   Cardiac Enzymes: No results for input(s): CKTOTAL, CKMB, CKMBINDEX, TROPONINI in the last 168 hours. CBG: No results for input(s): GLUCAP in the last 168 hours.  Iron Studies: No results for input(s): IRON, TIBC, TRANSFERRIN, FERRITIN in the last 72 hours. Studies/Results: Dg Chest 2 View  Result Date: 07/30/2017 CLINICAL DATA:  Fever this morning to 100.3 degrees. History of previous episodes of sepsis. The patient has dialysis dependent renal failure. The patient is on 2 L of supplemental oxygen. EXAM: CHEST - 2 VIEW COMPARISON:  None in PACs  FINDINGS: The lungs are adequately inflated. The lung markings are coarse in the retrocardiac region on the left. There is overlying cardiac monitoring caval and electrode however. There is no pleural effusion. The heart and pulmonary vascularity are normal. There are post CABG changes. There is calcification in the wall of the aortic arch. The dual-lumen dialysis catheter tip projects over the distal third of the SVC. The observed bony thorax is unremarkable. IMPRESSION: Possible subsegmental atelectasis or early pneumonia in the left lower lobe. No CHF or pleural effusion. Followup PA and lateral chest X-ray is recommended in 3-4 weeks following trial of antibiotic therapy to ensure resolution and exclude underlying malignancy. Previous CABG.  Thoracic aortic atherosclerosis. Electronically Signed   By: David  Martinique M.D.   On: 07/30/2017 13:03   Dg Wrist Complete Left  Result Date: 07/30/2017 CLINICAL DATA:  Left wrist pain and swelling  status post fall EXAM: LEFT WRIST - COMPLETE 3+ VIEW COMPARISON:  None. FINDINGS: Generalized osteopenia. No acute fracture or dislocation. 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 Mild osteoarthritis of the first [place] joint. Mild osteoarthritis of the radiocarpal joint. 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 IMPRESSION: 1.  No acute osseous injury of the left wrist. Electronically Signed   By: Kathreen Devoid   On: 07/30/2017 13:11   Ct Head Wo Contrast  Result Date: 07/30/2017 CLINICAL DATA:  Altered level of consciousness. Recent hip surgery. Fever. EXAM: CT HEAD WITHOUT CONTRAST TECHNIQUE: Contiguous axial images were obtained from the base of the skull through the vertex without intravenous contrast. COMPARISON:  None. FINDINGS: Brain: Nonspecific deep white matter hypodensity in the internal capsules and periventricular frontal lobes. No evidence of parenchymal hemorrhage or extra-axial fluid collection. No mass lesion, mass effect, or midline shift. Cerebral volume is age appropriate. No ventriculomegaly. Vascular: No acute abnormality. Skull: No evidence of calvarial fracture. Sinuses/Orbits: The visualized paranasal sinuses are essentially clear. Other:  The mastoid air cells are unopacified. IMPRESSION: 1. Nonspecific deep white matter hypodensity in the internal capsules and periventricular frontal lobes. Differential is broad and includes chronic small vessel ischemia, posterior reversal encephalopathy syndrome, lupus cerebritis and other inflammatory causes. MRI brain without and with IV contrast may be obtained as clinically warranted. 2. No acute intracranial hemorrhage. No mass effect. No hydrocephalus. Electronically Signed   By: Ilona Sorrel M.D.   On: 07/30/2017 15:35   Ct Hip Right Wo Contrast  Result Date: 07/31/2017 CLINICAL DATA:  Subacute onset of worsening right hip pain, with right hip joint effusion. EXAM: CT OF THE RIGHT HIP WITHOUT CONTRAST TECHNIQUE:  Multidetector CT imaging of the right hip was performed according to the standard protocol. Multiplanar CT image reconstructions were also generated. COMPARISON:  Right hip radiographs performed 07/30/2017 FINDINGS: Bones/Joint/Cartilage As described on recent radiograph, there is chronic osteolysis of the right hip, with resorption of the right femoral head and neck. There is superior displacement of the remaining proximal right femur. A large fluid collection is noted surrounding the proximal right femur, measuring approximately 18 x 13 x 12 cm. Peripheral soft tissue thickening is noted, concerning for a septic effusion. The attenuation suggests against hematoma. The effusion extends about the remaining flattened right acetabular fossa. There is no evidence of acute fracture. Ligaments Suboptimally assessed by CT. Muscles and Tendons There is diffuse atrophy of much of the musculature of the proximal right thigh. Tendon structures are not well characterized. Soft tissues Overlying diffuse soft tissue edema and skin thickening are noted  along the proximal right thigh. The visualized portions of the abdomen are grossly unremarkable. IMPRESSION: 1. Chronic osteolysis of the right hip, with resorption of the right femoral head and neck. Superior displacement of the remaining proximal right femur. 2. Large fluid collection surrounding the proximal right femur, measuring approximately 18 x 13 x 12 cm. Peripheral soft tissue thickening noted, concerning for a septic joint effusion. No definite evidence of hematoma. 3. Diffuse soft tissue edema and skin thickening along the proximal right thigh. Electronically Signed   By: Garald Balding M.D.   On: 07/31/2017 06:06   Dg Fluoro Guided Needle Plc Aspiration/injection Loc  Result Date: 07/31/2017 INDICATION: Left wrist pain and swelling. EXAM: FLUORO GUIDED NEEDLE PLACEMENT COMPARISON:  Left wrist radiographs-07/30/2017 MEDICATIONS: None FLUOROSCOPY TIME:  1 minutes 54  seconds COMPLICATIONS: None immediate TECHNIQUE: Informed written consent was obtained from the patient after a discussion of the risks, benefits and alternatives to treatment. The patient was placed supine on the fluoroscopy table and the left upper extremity placed by the patient's side, palm down. A rolled towel was placed about the palmar aspect of the wrist to encourage a minimal amount of wrist flexion. The radial carpal joint at the level of the proximal scaphoid was marked fluoroscopically. The volar tissues about the wrist were prepped and draped in usual sterile fashion. A timeout was performed prior to the initiation of the procedure. After the overlying soft tissues were anesthetized with 1% lidocaine, a 25 gauge needle was advanced into the radiocarpal joint targeting the inferior 1/3 of the scaphoid. Limited contrast injection confirmed intra-articular positioning and a total of approximately 4 mL of sterile saline was injected into the joint space. Attempted aspiration of the joint was then performed with minimal success. Only about 0.5 cc of fluid were removed. Several fluoroscopic images were saved during injection for procedural documentation purposes. The procedure was terminated. The needle was withdrawn and a dressing was placed. The patient tolerated the procedure well without immediate post procedural complication. FINDINGS: Fluoroscopic imaging demonstrates successful intra-articular injection of a contrast material. A very small volume of fluid was able to be aspirated from the joint space. IMPRESSION: Successful fluoroscopic guided arthrogram/aspiration of the left wrist. Electronically Signed   By: Kerby Moors M.D.   On: 07/31/2017 15:53   Dg Hip Unilat W Or Wo Pelvis 2-3 Views Right  Result Date: 07/30/2017 CLINICAL DATA:  Multiple right hip surgeries most recently 2 3 weeks ago. History of sepsis in the past and now was febrile to 100.3 degrees. Possible altered mental status.  EXAM: DG HIP (WITH OR WITHOUT PELVIS) 2-3V RIGHT COMPARISON:  There is osteolysis of the right femoral head and neck. The femoral shaft is high riding and displaced laterally. There is radiodense material adjacent to the acetabulum. There is a prosthetic left hip joint. FINDINGS: There is no evidence of hip fracture or dislocation. There is no evidence of arthropathy or other focal bone abnormality. IMPRESSION: Chronic osteolysis of the right hip with no normal appearing femoral head or neck. High positioning of the femoral shaft and lateral displacement. Correlation with with the patient's clinical history and previous images not available to me is needed. Electronically Signed   By: David  Martinique M.D.   On: 07/30/2017 13:06   Medications: Infusions: . vancomycin      Scheduled Medications: . aspirin  81 mg Oral Daily  . atorvastatin  40 mg Oral Daily  . cholecalciferol  1,000 Units Oral Daily  . [START ON  08/07/2017] darbepoetin (ARANESP) injection - DIALYSIS  100 mcg Intravenous Q Tue-HD  . ezetimibe  10 mg Oral Daily  . feeding supplement (PRO-STAT SUGAR FREE 64)  30 mL Oral BID  . ferrous sulfate  325 mg Oral Q breakfast  . FLUoxetine  20 mg Oral Daily  . iopamidol  20 mL Intra-articular Once  . levothyroxine  100 mcg Oral QAC breakfast  . lidocaine (PF)  5 mL Intradermal Once  . pantoprazole  40 mg Oral Daily  . QUEtiapine Fumarate  150 mg Oral QHS    have reviewed scheduled and prn medications.  Physical Exam: General: alert, sitting up- NAD Heart: RRR Lungs: mostly clear Abdomen: soft, non tender Extremities: min edema Dialysis Access: right PC- no tender     08/01/2017,10:13 AM  LOS: 2 days

## 2017-08-02 ENCOUNTER — Other Ambulatory Visit: Payer: Self-pay

## 2017-08-02 ENCOUNTER — Encounter (HOSPITAL_COMMUNITY): Payer: Self-pay | Admitting: General Practice

## 2017-08-02 DIAGNOSIS — Z881 Allergy status to other antibiotic agents status: Secondary | ICD-10-CM

## 2017-08-02 DIAGNOSIS — I658 Occlusion and stenosis of other precerebral arteries: Secondary | ICD-10-CM

## 2017-08-02 DIAGNOSIS — Z888 Allergy status to other drugs, medicaments and biological substances status: Secondary | ICD-10-CM

## 2017-08-02 DIAGNOSIS — Z882 Allergy status to sulfonamides status: Secondary | ICD-10-CM

## 2017-08-02 DIAGNOSIS — Z7952 Long term (current) use of systemic steroids: Secondary | ICD-10-CM

## 2017-08-02 DIAGNOSIS — Z96641 Presence of right artificial hip joint: Secondary | ICD-10-CM

## 2017-08-02 DIAGNOSIS — M329 Systemic lupus erythematosus, unspecified: Secondary | ICD-10-CM

## 2017-08-02 LAB — RENAL FUNCTION PANEL
Albumin: 1.5 g/dL — ABNORMAL LOW (ref 3.5–5.0)
Anion gap: 12 (ref 5–15)
BUN: 51 mg/dL — ABNORMAL HIGH (ref 6–20)
CHLORIDE: 106 mmol/L (ref 101–111)
CO2: 25 mmol/L (ref 22–32)
Calcium: 7.9 mg/dL — ABNORMAL LOW (ref 8.9–10.3)
Creatinine, Ser: 4.63 mg/dL — ABNORMAL HIGH (ref 0.44–1.00)
GFR, EST AFRICAN AMERICAN: 11 mL/min — AB (ref >60)
GFR, EST NON AFRICAN AMERICAN: 10 mL/min — AB (ref >60)
Glucose, Bld: 88 mg/dL (ref 65–99)
POTASSIUM: 3.3 mmol/L — AB (ref 3.5–5.1)
Phosphorus: 4.1 mg/dL (ref 2.5–4.6)
Sodium: 143 mmol/L (ref 135–145)

## 2017-08-02 LAB — CBC
HEMATOCRIT: 27.6 % — AB (ref 36.0–46.0)
Hemoglobin: 8.5 g/dL — ABNORMAL LOW (ref 12.0–15.0)
MCH: 27.3 pg (ref 26.0–34.0)
MCHC: 30.8 g/dL (ref 30.0–36.0)
MCV: 88.7 fL (ref 78.0–100.0)
Platelets: 265 10*3/uL (ref 150–400)
RBC: 3.11 MIL/uL — AB (ref 3.87–5.11)
RDW: 17.3 % — ABNORMAL HIGH (ref 11.5–15.5)
WBC: 6.1 10*3/uL (ref 4.0–10.5)

## 2017-08-02 LAB — IRON AND TIBC
IRON: 23 ug/dL — AB (ref 28–170)
SATURATION RATIOS: 19 % (ref 10.4–31.8)
TIBC: 120 ug/dL — ABNORMAL LOW (ref 250–450)
UIBC: 97 ug/dL

## 2017-08-02 LAB — FERRITIN: FERRITIN: 759 ng/mL — AB (ref 11–307)

## 2017-08-02 MED ORDER — HEPARIN SODIUM (PORCINE) 1000 UNIT/ML DIALYSIS
3000.0000 [IU] | Freq: Once | INTRAMUSCULAR | Status: AC
  Administered 2017-08-02: 3000 [IU] via INTRAVENOUS_CENTRAL

## 2017-08-02 MED ORDER — ALTEPLASE 2 MG IJ SOLR: 2.0000 mg | Freq: Once | INTRAMUSCULAR | Status: DC | PRN

## 2017-08-02 MED ORDER — LIDOCAINE HCL (PF) 1 % IJ SOLN: 5.0000 mL | INTRAMUSCULAR | Status: DC | PRN

## 2017-08-02 MED ORDER — HEPARIN SODIUM (PORCINE) 1000 UNIT/ML DIALYSIS
1000.0000 [IU] | INTRAMUSCULAR | Status: DC | PRN
  Administered 2017-08-02: 1000 [IU] via INTRAVENOUS_CENTRAL
  Filled 2017-08-02 (×2): qty 1

## 2017-08-02 MED ORDER — SODIUM CHLORIDE 0.9 % IV SOLN: 100.0000 mL | INTRAVENOUS | Status: DC | PRN

## 2017-08-02 MED ORDER — PREDNISONE 10 MG PO TABS: ORAL_TABLET | ORAL | 0 refills | Status: AC

## 2017-08-02 MED ORDER — HYDROXYCHLOROQUINE SULFATE 200 MG PO TABS: 200.0000 mg | ORAL_TABLET | Freq: Every day | ORAL | 0 refills | Status: AC

## 2017-08-02 MED ORDER — LIDOCAINE-PRILOCAINE 2.5-2.5 % EX CREA: 1.0000 "application " | TOPICAL_CREAM | CUTANEOUS | Status: DC | PRN

## 2017-08-02 MED ORDER — LEVOTHYROXINE SODIUM 75 MCG PO TABS: 75.0000 ug | ORAL_TABLET | Freq: Every day | ORAL | 0 refills | Status: DC

## 2017-08-02 MED ORDER — PENTAFLUOROPROP-TETRAFLUOROETH EX AERO: 1.0000 "application " | INHALATION_SPRAY | CUTANEOUS | Status: DC | PRN

## 2017-08-02 MED ORDER — CLOPIDOGREL BISULFATE 75 MG PO TABS: 75.0000 mg | ORAL_TABLET | Freq: Every day | ORAL | 11 refills | Status: AC

## 2017-08-02 MED ORDER — DARBEPOETIN ALFA 100 MCG/0.5ML IJ SOSY: 100.0000 ug | PREFILLED_SYRINGE | INTRAMUSCULAR | Status: DC

## 2017-08-02 NOTE — Evaluation (Addendum)
Physical Therapy Evaluation Patient Details Name: Crystal Clarke MRN: [id number] DOB: 08/06/1960 Today's Date: 08/02/2017   History of Present Illness   57yo female with PMH of CAD s/p CABG, HTN, SLE, Raynaud's, CVA, hypothyroidism, and ESRD on HD TuThSa who presents from [facility] with reported fever and acute onset of left and right wrist swelling, pain, and erythema  Clinical Impression  Pt admitted with above diagnosis. Pt currently with functional limitations due to the deficits listed below (see PT Problem List). Min assist for sit to stand. Pt ambulated 12' with RW, distance limited by L wrist pain with use of RW. Pt is eager to DC home, she declined returning [facility]. HHPT and home health aide recommended. She reports neighbors and her ex-husband can assist her prn. Pt will benefit from skilled PT to increase their independence and safety with mobility to allow discharge to the venue listed below.       Follow Up Recommendations Home health PT;Supervision for mobility/OOB; home health aide    Equipment Recommendations  None recommended by PT    Recommendations for Other Services       Precautions / Restrictions Precautions Precautions: Fall Precaution Comments: several falls before dialysis started a month ago, no falls since starting dialysis Restrictions Weight Bearing Restrictions: No      Mobility  Bed Mobility               General bed mobility comments: up in recliner  Transfers Overall transfer level: Needs assistance Equipment used: Rolling walker (2 wheeled) Transfers: Sit to/from Stand Sit to Stand: Min assist         General transfer comment: min A and increased time to power up  Ambulation/Gait Ambulation/Gait assistance: Min guard Ambulation Distance (Feet): 45 Feet Assistive device: Rolling walker (2 wheeled) Gait Pattern/deviations: Step-through pattern;Decreased stride length Gait velocity: decr   General Gait Details: steady with  RW, no LOB, distance limited by L wrist pain with use of RW  Stairs            Wheelchair Mobility    Modified Rankin (Stroke Patients Only)       Balance Overall balance assessment: Needs assistance   Sitting balance-Leahy Scale: Good     Standing balance support: Bilateral upper extremity supported Standing balance-Leahy Scale: Poor Standing balance comment: relies on BUE support                             Pertinent Vitals/Pain Pain Assessment: 0-10 Pain Score: 3  Pain Location: kidneys Pain Descriptors / Indicators: Aching Pain Intervention(s): Limited activity within patient's tolerance;Monitored during session    Home Living Family/patient expects to be discharged to:: Private residence Living Arrangements: Alone Available Help at Discharge: Neighbor(ex husband can help too) Type of Home: Apartment(in retirement community) Home Access: Level entry     Home Layout: One level Home Equipment: Walker - 2 wheels;Grab bars - tub/shower;Grab bars - toilet;Other (comment);Wheelchair - manual;Adaptive equipment(R built up shoe) Additional Comments: admitted from [facility], wants to DC home    Prior Function Level of Independence: Independent with assistive device(s)         Comments: used RW, reports several falls prior to starting dialysis and no falls since starting dialysis, drives, could cook and clean     Hand Dominance        Extremity/Trunk Assessment   Upper Extremity Assessment Upper Extremity Assessment: Defer to OT evaluation  Lower Extremity Assessment Lower Extremity Assessment: RLE deficits/detail;LLE deficits/detail RLE Deficits / Details: h/o R THA with pelvic fx, THA removed and pt had muscle flap procedure, has built up shoe for RLE, knee ext +4/5 LLE Deficits / Details: h/o TKA and multiple other knee surgeries, knee ext AROM -20*, knee ext PROM to 0*, knee ext -4/5 in available range    Cervical / Trunk  Assessment Cervical / Trunk Assessment: Normal  Communication   Communication: No difficulties  Cognition Arousal/Alertness: Awake/alert Behavior During Therapy: WFL for tasks assessed/performed Overall Cognitive Status: Within Functional Limits for tasks assessed                                        General Comments      Exercises     Assessment/Plan    PT Assessment Patient needs continued PT services  PT Problem List Decreased strength;Decreased balance;Decreased activity tolerance;Decreased range of motion;Decreased mobility       PT Treatment Interventions DME instruction;Gait training;Therapeutic activities    PT Goals (Current goals can be found in the Care Plan section)  Acute Rehab PT Goals Patient Stated Goal: eager to DC home to her dog and bed PT Goal Formulation: With patient Time For Goal Achievement: 08/16/17 Potential to Achieve Goals: Good    Frequency Min 3X/week   Barriers to discharge        Co-evaluation PT/OT/SLP Co-Evaluation/Treatment: Yes Reason for Co-Treatment: For patient/therapist safety;To address functional/ADL transfers PT goals addressed during session: Mobility/safety with mobility;Balance;Proper use of DME;Strengthening/ROM         AM-PAC PT "6 Clicks" Daily Activity  Outcome Measure Difficulty turning over in bed (including adjusting bedclothes, sheets and blankets)?: A Little Difficulty moving from lying on back to sitting on the side of the bed? : A Lot Difficulty sitting down on and standing up from a chair with arms (e.g., wheelchair, bedside commode, etc,.)?: Unable Help needed moving to and from a bed to chair (including a wheelchair)?: A Little Help needed walking in hospital room?: A Little Help needed climbing 3-5 steps with a railing? : A Lot 6 Click Score: 14    End of Session Equipment Utilized During Treatment: Gait belt Activity Tolerance: Patient tolerated treatment well Patient left: in  chair;with call bell/phone within reach Nurse Communication: Mobility status PT Visit Diagnosis: History of falling (Z91.81);Muscle weakness (generalized) (M62.81);Difficulty in walking, not elsewhere classified (R26.2)    Time: 0102-7253 PT Time Calculation (min) (ACUTE ONLY): 30 min   Charges: PT Eval -low        PT G Codes:          Philomena Doheny 08/02/2017, 12:28 PM [phone]

## 2017-08-02 NOTE — Progress Notes (Signed)
HD tx completed @ 0550 w/o problem, UF goal  Met, blood rinsed back, VSS, report called to [facility], RN

## 2017-08-02 NOTE — Progress Notes (Signed)
Patient discharged to home with instructions and prescription. 

## 2017-08-02 NOTE — Progress Notes (Signed)
Subjective:  Crystal Clarke was reclining in bed this morning. She denied subjective fevers/chills. She thinks that the swelling and pain in her bilateral wrists is about the same, maybe a little better. She does think a course of steroids might be helpful to get her over this acute flare. Discussed plan for discharge today after PT/OT eval.  Objective:  Vital signs in last 24 hours: Vitals:   08/02/17 0500 08/02/17 0530 08/02/17 0550 08/02/17 0557  BP: 109/62 116/65 100/64 126/73  Pulse: (!) 51 (!) 59 61 60  Resp: 11 12 14 16   Temp:    (!) 97.2 F (36.2 C)  TempSrc:    Oral  SpO2: 99% 98% 99% 98%  Weight:    141 lb 1.5 oz (64 kg)  Height:       GEN: Appears older than stated age, lying in bed in NAD CV: NR & RR, systolic murmur, best heard at LUSB. No LE edema. Indwelling hemodialysis catheter in right anterior chest wall without surrounding swelling or erythema EXT: Trace BLE edema, R>L Right leg shorter than left leg. Chronic swelling of right hip. Left hand synovitis on MCPs, decreased swelling of bilateral wrists.  Assessment/Plan:  Principal Problem:   Fever Active Problems:   Anemia   Left wrist pain  Crystal Clarke is a 57yo female with PMH of CAD s/p CABG, HTN, SLE, Raynaud's, CVA, hypothyroidism, and ESRD on HD TuThSa who presents from [facility] with reported fever and acute onset of left and right wrist swelling, pain, and erythema. Aspiration of left wrist synovial fluid shows no organisms or crystals. Our working diagnosis at this point is lupus flare. Will treat with steroid taper and plaquenil and encouraged her to follow up with her outpatient Rheumatologist and PCP for further management. She has remained afebrile off of vancomycin.  Polyarticular arthralgia, secondary to lupus flareNo crystals or organisms seen on synovial fluid analysis from left wrist. BCx and fluid culture thus far are negative x2 days.  - F/u synovial fluid cultures -> NG x2d - Steroid  taper: 40mg  x2d, 30mg  x2d, 20mg  x2d, 10mg  x2d, 5mg  until f/u with Rheumatology - Hydroxychloroquine 200mg  daily - PT/OT eval - D/c today after PT/OT eval  Reported fever x1, hypotension, resolved Remains afebrile off of vancomycin. BP has improved to 126/73 this morning - thought to be related to being under her dry weight from dialysis. Other vitals are stable. Intermittent fevers could be secondary to SLE. - F/u BCx -> NG x2d - Incentive spirometer  Hx of recurrent avascular necrosis Hx of septic right hip joint Hx of left revision total hip arthroplasty, B/L knee replacement Repeat CT of right hip shows persistent 18x13x12cm fluid collection. Discussed with Orthopedics, no need for intervention at this time.  Acute anemia Hb 5.8 on admission (baseline ~7), improved to 8.8 s/p 2u pRBC transfusion. Stable at 8.5 this AM. Ferritin elevated, low iron, haptoglobin and LDH elevated, suggestive of anemia of chronic disease. - Continue home ferrous sulfate 325mg  daily - Continue protonix 40mg  daily  CAD s/p CABG Hx of HTN No chest pain. Recent admission for NSTEMI at [facility]. - Continue aspirin 81mg  daily and plavix 75mg  daily - Restart home carvedilol 6.25mg  BID on discharge - Continue home atorvastatin 40mg  daily  Hx of SLE Sees Rheumatology at [facility]. She has previously been treated with 2 courses of cytoxan, prednisone, cell-cept, and plaquenil. Last course of steroids was reported to be over a year ago. Currently not on any treatment  per discussions between Nephrology and Rheumatology due to concern for its immunosuppressive effects in the setting of multiple recent complicated infections. 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 - Nephrology consulted for inpatient HD; appreciate their assistance  Hx of  hypothyroidism TSH normal in 03/2017 - Continue levothyroxine 68mcg daily  Dispo: Anticipated discharge today.   Colbert Ewing, MD 08/02/2017, 6:22 AM Pager: Mamie Nick [phone]

## 2017-08-02 NOTE — Care Management Note (Signed)
Case Management Note  Patient Details  Name: Crystal Clarke MRN: [id number] Date of Birth: 12-18-1960  Subjective/Objective:                    Action/Plan:   Expected Discharge Date:  08/02/17               Expected Discharge Plan:  [name]  In-House Referral:  Clinical Social Work  Discharge planning Services  CM Consult  Post Acute Care Choice:  Home Health Choice offered to:  Patient  DME Arranged:  N/A DME Agency:  NA  HH Arranged:  PT, OT [place] Agency:  [facility]  Status of Service:  Completed, signed off  If discussed at [place] of Stay Meetings, dates discussed:    Additional Comments:  Marilu Favre, RN 08/02/2017, 1:30 PM

## 2017-08-02 NOTE — Progress Notes (Signed)
Subjective:  Finished HD at 0500- removed 1500 tolerated well - tells me she may be going home - to [place] Objective Vital signs in last 24 hours: Vitals:   08/02/17 0530 08/02/17 0550 08/02/17 0557 08/02/17 0652  BP: 116/65 100/64 126/73 (!) 104/57  Pulse: (!) 59 61 60 (!) 58  Resp: 12 14 16 16   Temp:   (!) 97.2 F (36.2 C) 97.8 F (36.6 C)  TempSrc:   Oral Oral  SpO2: 98% 99% 98% 100%  Weight:   64 kg (141 lb 1.5 oz)   Height:       Weight change:   Intake/Output Summary (Last 24 hours) at 08/02/2017 1005 Last data filed at 08/02/2017 0550 Gross per 24 hour  Intake 120 ml  Output 1851 ml  Net -1731 ml    Assessment/Plan: 1.  Fever/Hypotension: fever resolved, WBC normal - Large number of sources including possible lupus flare, TDC, PNA and septic joint.  BC NG X 24 hrs. Antibiotics stopped  2.  L wrist pain: Seen by Ortho. 3.   SLE-currently not on prednisone/plaquenil. Usually followed by rheumatology at [facility]. Per primary 4.  ESRD -  T,Th,S via RIJ TDC. K+ 3.7 Using 4.0 K bath. Usual heparin dose.  Plan for next HD on Sat.  I think may be possibly to have OP arranged in [place] by then- will start working on it 5.  Hypertension/volume  - BP very soft.  OP EDW 54.4 kg. 1500 removed this AM 6.  Anemia  - HGB 5.8 on admission. Has rec'd 2 units PRBCs- felt better after, HGB 8.5 today. Follow HGB. Given Aranesp 100 mcg - iron stores low- when cultures final neg can replete 7.  Metabolic bone disease - Ca 7.8 C Ca 9.8 Phos 4.1. No binders, VDRA on OP med list. Monitor labs for now.  8.  Nutrition - Albumin 1.5. Severe PCM. Renal diet, prostat,  9.  H/O CAD, CABG.   Crystal Clarke    Labs: Basic Metabolic Panel: Recent Labs  Lab 07/30/17 1037 07/31/17 0400 08/02/17 0410  NA 137 139 143  K 3.5 3.7 3.3*  CL 100* 105 106  CO2 24 22 25   GLUCOSE 92 87 88  BUN 24* 30* 51*  CREATININE 5.06* 4.80* 4.63*  CALCIUM 8.0* 7.8* 7.9*  PHOS  --  5.1* 4.1   Liver Function  Tests: Recent Labs  Lab 07/30/17 1037 07/31/17 0400 08/02/17 0410  AST 23  --   --   ALT 9*  --   --   ALKPHOS 92  --   --   BILITOT 0.7  --   --   PROT 5.3*  --   --   ALBUMIN 1.6* 1.5* 1.5*   Recent Labs  Lab 07/30/17 1037  LIPASE 27   No results for input(s): AMMONIA in the last 168 hours. CBC: Recent Labs  Lab 07/30/17 1336 07/31/17 0400 08/01/17 0424 08/02/17 0230  WBC 7.7 5.6 6.1 6.1  NEUTROABS 5.1  --   --   --   HGB 5.8* 8.8* 8.5* 8.5*  HCT 19.4* 28.2* 28.0* 27.6*  MCV 91.5 88.4 89.5 88.7  PLT 267 246 246 265   Cardiac Enzymes: No results for input(s): CKTOTAL, CKMB, CKMBINDEX, TROPONINI in the last 168 hours. CBG: No results for input(s): GLUCAP in the last 168 hours.  Iron Studies:  Recent Labs    08/02/17 0430  IRON 23*  TIBC 120*  FERRITIN 759*   Studies/Results: Dg Fluoro Guided  Needle Plc Aspiration/injection Loc  Result Date: 07/31/2017 INDICATION: Left wrist pain and swelling. EXAM: FLUORO GUIDED NEEDLE PLACEMENT COMPARISON:  Left wrist radiographs-07/30/2017 MEDICATIONS: None FLUOROSCOPY TIME:  1 minutes 54 seconds COMPLICATIONS: None immediate TECHNIQUE: Informed written consent was obtained from the patient after Clarke discussion of the risks, benefits and alternatives to treatment. The patient was placed supine on the fluoroscopy table and the left upper extremity placed by the patient's side, palm down. Clarke rolled towel was placed about the palmar aspect of the wrist to encourage Clarke minimal amount of wrist flexion. The radial carpal joint at the level of the proximal scaphoid was marked fluoroscopically. The volar tissues about the wrist were prepped and draped in usual sterile fashion. Clarke timeout was performed prior to the initiation of the procedure. After the overlying soft tissues were anesthetized with 1% lidocaine, Clarke 25 gauge needle was advanced into the radiocarpal joint targeting the inferior 1/3 of the scaphoid. Limited contrast injection  confirmed intra-articular positioning and Clarke total of approximately 4 mL of sterile saline was injected into the joint space. Attempted aspiration of the joint was then performed with minimal success. Only about 0.5 cc of fluid were removed. Several fluoroscopic images were saved during injection for procedural documentation purposes. The procedure was terminated. The needle was withdrawn and Clarke dressing was placed. The patient tolerated the procedure well without immediate post procedural complication. FINDINGS: Fluoroscopic imaging demonstrates successful intra-articular injection of Clarke contrast material. Clarke very small volume of fluid was able to be aspirated from the joint space. IMPRESSION: Successful fluoroscopic guided arthrogram/aspiration of the left wrist. Electronically Signed   By: Kerby Moors M.D.   On: 07/31/2017 15:53   Medications: Infusions: . sodium chloride    . sodium chloride      Scheduled Medications: . aspirin  81 mg Oral Daily  . atorvastatin  40 mg Oral Daily  . cholecalciferol  1,000 Units Oral Daily  . darbepoetin (ARANESP) injection - DIALYSIS  100 mcg Intravenous Q Thu-HD  . ezetimibe  10 mg Oral Daily  . feeding supplement (PRO-STAT SUGAR FREE 64)  30 mL Oral BID  . ferrous sulfate  325 mg Oral Q breakfast  . FLUoxetine  20 mg Oral Daily  . iopamidol  20 mL Intra-articular Once  . levothyroxine  75 mcg Oral QAC breakfast  . lidocaine (PF)  5 mL Intradermal Once  . pantoprazole  40 mg Oral Daily  . QUEtiapine Fumarate  150 mg Oral QHS    have reviewed scheduled and prn medications.  Physical Exam: General: alert, sitting up- NAD Heart: RRR Lungs: mostly clear Abdomen: soft, non tender Extremities: min edema Dialysis Access: right PC- non tender     08/02/2017,10:05 AM  LOS: 3 days

## 2017-08-02 NOTE — Evaluation (Signed)
Occupational Therapy Evaluation Patient Details Name: Crystal Clarke MRN: [id number] DOB: 25-Dec-1960 Today's Date: 08/02/2017    History of Present Illness  57yo female with PMH of CAD s/p CABG, HTN, SLE, Raynaud's, CVA, hypothyroidism, and ESRD on HD TuThSa who presents from [facility] with reported fever and acute onset of left and right wrist swelling, pain, and erythema   Clinical Impression   Pt with decline in function and safety with ADLs and ADL mobility with decreased strength, balance and endurance. Pt with hx of Lupus, R hip and L knee surgeries. PTA pt at [facility] for rehab but wishes to return home with support from her neighbor and ex husband. Pt would benefit from acute OT services to address impairments to maximize level of function and safety    Follow Up Recommendations  Home health OT;Supervision - Intermittent(HH aide)    Equipment Recommendations  None recommended by OT    Recommendations for Other Services       Precautions / Restrictions Precautions Precautions: Fall Precaution Comments: several falls before dialysis started a month ago, no falls since starting dialysis Restrictions Weight Bearing Restrictions: No      Mobility Bed Mobility               General bed mobility comments: up in recliner  Transfers Overall transfer level: Needs assistance Equipment used: Rolling walker (2 wheeled) Transfers: Sit to/from Stand Sit to Stand: Min assist         General transfer comment: min A and increased time to power up    Balance Overall balance assessment: Needs assistance   Sitting balance-Leahy Scale: Good     Standing balance support: Bilateral upper extremity supported;During functional activity Standing balance-Leahy Scale: Poor Standing balance comment: relies on BUE support                           ADL either performed or assessed with clinical judgement   ADL Overall ADL's : Needs  assistance/impaired Eating/Feeding: Independent;Sitting   Grooming: Wash/dry hands;Wash/dry face;Min guard;Standing   Upper Body Bathing: Supervision/ safety;Set up;Sitting   Lower Body Bathing: Moderate assistance   Upper Body Dressing : Supervision/safety;Set up;Sitting   Lower Body Dressing: Moderate assistance   Toilet Transfer: Minimal assistance;Ambulation;RW   Toileting- Clothing Manipulation and Hygiene: Moderate assistance;Sit to/from stand   Tub/ Shower Transfer: Minimal assistance;Rolling walker;3 in 1;Ambulation   Functional mobility during ADLs: Minimal assistance;Rolling walker General ADL Comments: pt has DME and A/E at home from previous ortho surgeries; was at [facility] PTA for rehab     Vision Baseline Vision/History: Wears glasses Wears Glasses: Reading only Patient Visual Report: No change from baseline       Perception     Praxis      Pertinent Vitals/Pain Pain Assessment: 0-10 Pain Score: 3  Pain Location: kidneys Pain Descriptors / Indicators: Aching Pain Intervention(s): Limited activity within patient's tolerance;Monitored during session;Repositioned     Hand Dominance Right   Extremity/Trunk Assessment Upper Extremity Assessment Upper Extremity Assessment: Generalized weakness   Lower Extremity Assessment Lower Extremity Assessment: Defer to PT evaluation RLE Deficits / Details: h/o R THA with pelvic fx, THA removed and pt had muscle flap procedure, has built up shoe for RLE, knee ext +4/5 LLE Deficits / Details: h/o TKA and multiple other knee surgeries, knee ext AROM -20*, knee ext PROM to 0*, knee ext -4/5 in available range   Cervical / Trunk Assessment Cervical /  Trunk Assessment: Normal   Communication Communication Communication: No difficulties   Cognition Arousal/Alertness: Awake/alert Behavior During Therapy: WFL for tasks assessed/performed Overall Cognitive Status: Within Functional Limits for tasks assessed                                      General Comments       Exercises     Shoulder Instructions      Home Living Family/patient expects to be discharged to:: Private residence Living Arrangements: Alone Available Help at Discharge: Neighbor Type of Home: Apartment Home Access: Level entry     [place]: One level     Bathroom Shower/Tub: Teacher, early years/pre: Handicapped height     [place]: [place] - 2 wheels;Grab bars - tub/shower;Grab bars - toilet;Other (comment);Wheelchair - Scientist, physiological: Reacher;Long-handled sponge;Sock aid;Long-handled English as a second language teacher Comments: admitted from [facility], wants to DC home      Prior Functioning/Environment Level of Independence: Independent with assistive device(s)        Comments: used RW, reports several falls prior to starting dialysis and no falls since starting dialysis, drives, could cook and clean        OT Problem List: Decreased strength;Decreased activity tolerance;Impaired balance (sitting and/or standing);Decreased coordination      OT Treatment/Interventions: Self-care/ADL training;DME and/or AE instruction;Therapeutic activities;Therapeutic exercise;Neuromuscular education;Patient/family education    OT Goals(Current goals can be found in the care plan section) Acute Rehab OT Goals Patient Stated Goal: home OT Goal Formulation: With patient Time For Goal Achievement: 08/16/17 Potential to Achieve Goals: Good ADL Goals Pt Will Perform Grooming: with supervision;with set-up;standing Pt Will Perform Lower Body Bathing: with min assist;sit to/from stand Pt Will Perform Lower Body Dressing: with min assist;sit to/from stand Pt Will Transfer to Toilet: with min guard assist;ambulating Pt Will Perform Toileting - Clothing Manipulation and hygiene: with min assist;sit to/from stand Pt Will Perform Tub/Shower Transfer: with min guard  assist;ambulating;rolling walker;3 in 1  OT Frequency: Min 2X/week   Barriers to D/C:    no barriers, pt reports that seh can have 24/7 assist from her neighbor who has assisted her before and from he rex husband that lives 1/2 mile from her       Co-evaluation PT/OT/SLP Co-Evaluation/Treatment: Yes Reason for Co-Treatment: For patient/therapist safety;To address functional/ADL transfers PT goals addressed during session: Mobility/safety with mobility;Balance;Proper use of DME;Strengthening/ROM OT goals addressed during session: ADL's and self-care;Proper use of Adaptive equipment and DME      AM-PAC PT "6 Clicks" Daily Activity     Outcome Measure Help from another person eating meals?: None Help from another person taking care of personal grooming?: A Little Help from another person toileting, which includes using toliet, bedpan, or urinal?: A Little Help from another person bathing (including washing, rinsing, drying)?: A Lot Help from another person to put on and taking off regular upper body clothing?: None Help from another person to put on and taking off regular lower body clothing?: A Lot 6 Click Score: 18   End of Session Equipment Utilized During Treatment: Gait belt;Rolling walker;Other (comment)(3 in 1)  Activity Tolerance: Patient tolerated treatment well Patient left: in chair;with call bell/phone within reach  OT Visit Diagnosis: Unsteadiness on feet (R26.81);Other abnormalities of gait and mobility (R26.89);Muscle weakness (generalized) (M62.81);History of falling (Z91.81)  Time: 6203-5597 OT Time Calculation (min): 29 min Charges:  OT General Charges $OT Visit: 1 Visit OT Evaluation $OT Eval Low Complexity: 1 Low G-Codes: OT G-codes **NOT FOR INPATIENT CLASS** Functional Assessment Tool Used: AM-PAC 6 Clicks Daily Activity     Britt Bottom 08/02/2017, 2:23 PM

## 2017-08-02 NOTE — Progress Notes (Signed)
HD tx initiated via HD cath w/o problem, pull/push/flush equally w/o problem, VSS w/ soft bp, will cont to monitor while on HD tx

## 2017-08-02 NOTE — Social Work (Signed)
CSW met with pt and pt ex partner at bedside. Pt is discharging home with home health. Pt expressed pleasure at being able to go home and get dialysis closer to home. Pt was given information about RCATs and encouraged to call and arrange transportation to dialysis the following week.   Pt and pt ex partner state concern about future flare ups, CSW encouraged them to listen and ask questions during discharge instructions and keep discharge information handy in case of need to call and follow up. Pt and pt ex partner state understanding and gratitude for CSW visit.   Alexander Mt, [place] Work [phone]

## 2017-08-02 NOTE — Social Work (Signed)
Aware pt dialysis plan is for transition to [place], if pt transitions to [facility] and still requires SNF level support then pt will have to discharge to another facility closer to dialysis center.   CSW following for therapy recommendations.   Alexander Mt, [place] Work [phone]

## 2017-08-03 ENCOUNTER — Other Ambulatory Visit: Payer: Self-pay

## 2017-08-03 ENCOUNTER — Other Ambulatory Visit: Payer: Self-pay | Admitting: *Deleted

## 2017-08-03 ENCOUNTER — Encounter: Payer: Self-pay | Admitting: *Deleted

## 2017-08-03 LAB — TYPE AND SCREEN
ABO/RH(D): A POS
Antibody Screen: NEGATIVE
Unit division: 0
Unit division: 0
Unit division: 0
Unit division: 0

## 2017-08-03 LAB — BPAM RBC
Blood Product Expiration Date: 201905262359
Blood Product Expiration Date: 201905282359
Blood Product Expiration Date: 201906012359
Blood Product Expiration Date: 201906012359
ISSUE DATE / TIME: 201905131757
ISSUE DATE / TIME: 201905132159
Unit Type and Rh: 6200
Unit Type and Rh: 6200
Unit Type and Rh: 6200
Unit Type and Rh: 6200

## 2017-08-03 LAB — HEPATITIS B SURFACE ANTIGEN: Hepatitis B Surface Ag: NEGATIVE

## 2017-08-03 NOTE — Patient Outreach (Addendum)
[facility]) Care Management  08/03/2017  Fantashia Shupert 12-Dec-1960 [id number]   CSW was able to make initial contact with patient today to perform phone assessment, as well as assess and assist with social work needs and services.  CSW noted that patient was discharged back home, after leaving the hospital on Thursday, Aug 02, 2017.  Patient was residing at [facility], [place], to receive short-term rehabilitative services.  CSW introduced self, explained role and types of services provided through [place] [facility] ([place] [facility]).  CSW further explained to patient that CSW works with patient's Telephonic RNCM, also with [place] [facility], Kandra Nicolas Florance. CSW then explained the reason for the call, indicating that Mrs. Florance thought that patient would benefit from social work services and resources to assist with transportation to and from physician appointments, as well as dialysis treatments and assist with obtaining in-home are services.  CSW obtained two HIPAA compliant identifiers from patient, which included patient's name and date of birth. Patient reported that home health services have been arranged for her through [facility].  These services include a home health nurse, physical therapist and bath aide.  Patient further reported already having all the equipment she needs in the home (I.e wheelchair, walker, rolling walker with seat, crutches, lift chair, etc.).  Patient reported that she has a vehicle and prefers to drive herself to all her appointments; however, patient would like to have a back-up means of transportation, in the event that she is unable to transport herself.  Patient is also requesting a home health aide, someone to provide light housekeeping duties, perform grocery shopping, meal preparation, etc.  CSW will pass along this information to [name], social work Social worker, also with Marketing executive, as Ms. Marcelline Deist will be covering in CSW's absence next week.  CSW explained to patient that she will be receiving a call from Ms. Marcelline Deist to assist with arranging transportation, as well as to provide patient with a list of in-home care agencies.  Patient voiced understanding and was agreeable to this plan.  No additional social work needs have been identified at this time. Nat Christen, BSW, MSW, LCSW  Licensed Education officer, [facility]  Mailing [place] [address] [place], [place] [address] Physical Address-[address], [place], [place] [address] Toll Free Main # [phone] Fax # [phone] Cell # [phone]  Office # [phone] [email or web address]

## 2017-08-03 NOTE — Addendum Note (Signed)
Addended by: Nat Christen D on: 08/03/2017 11:25 AM   Modules accepted: Orders

## 2017-08-03 NOTE — Patient Outreach (Signed)
Transition of care: Discharged on 08/02/2017  Well known patient to me. Placed call to patient who reports she had a heart attack and was sent to [facility]. Reports her kidneys took a hit and ended up on dialysis.  Reports went to [facility] for rehab and then developed new swelling in wrist and pneumonia. Was sent to [facility] and discharged on 08/02/2017.  Reviewed with patient transition of care program.    Patient confirmed that she has all her medications. Reports she has made her follow up appointment with Dr. Tobie Poet for 08/06/2017. Reports she is unsure about her dialysis. Reports she is going to the dialysis center today to complete paperwork and get on the schedule in [place].  PLAN: offered home visit and patient has accepted. Unknown dates of dialysis, so I will call patient back on 08/06/2017.  Tomasa Rand, RN, BSN, CEN [facility] [employer] [phone]

## 2017-08-04 DIAGNOSIS — N186 End stage renal disease: Secondary | ICD-10-CM | POA: Diagnosis not present

## 2017-08-04 DIAGNOSIS — D509 Iron deficiency anemia, unspecified: Secondary | ICD-10-CM | POA: Diagnosis not present

## 2017-08-04 DIAGNOSIS — D631 Anemia in chronic kidney disease: Secondary | ICD-10-CM | POA: Diagnosis not present

## 2017-08-04 DIAGNOSIS — Z111 Encounter for screening for respiratory tuberculosis: Secondary | ICD-10-CM | POA: Diagnosis not present

## 2017-08-04 DIAGNOSIS — Z452 Encounter for adjustment and management of vascular access device: Secondary | ICD-10-CM | POA: Diagnosis not present

## 2017-08-04 DIAGNOSIS — Z992 Dependence on renal dialysis: Secondary | ICD-10-CM | POA: Diagnosis not present

## 2017-08-04 DIAGNOSIS — E876 Hypokalemia: Secondary | ICD-10-CM | POA: Diagnosis not present

## 2017-08-04 DIAGNOSIS — M3210 Systemic lupus erythematosus, organ or system involvement unspecified: Secondary | ICD-10-CM | POA: Diagnosis not present

## 2017-08-04 DIAGNOSIS — D689 Coagulation defect, unspecified: Secondary | ICD-10-CM | POA: Diagnosis not present

## 2017-08-04 LAB — CULTURE, BLOOD (ROUTINE X 2)
CULTURE: NO GROWTH
Culture: NO GROWTH

## 2017-08-04 LAB — BODY FLUID CULTURE
Culture: NO GROWTH
Gram Stain: NONE SEEN

## 2017-08-05 LAB — ANAEROBIC CULTURE

## 2017-08-06 ENCOUNTER — Other Ambulatory Visit: Payer: Self-pay

## 2017-08-06 DIAGNOSIS — I214 Non-ST elevation (NSTEMI) myocardial infarction: Secondary | ICD-10-CM | POA: Diagnosis not present

## 2017-08-06 DIAGNOSIS — D631 Anemia in chronic kidney disease: Secondary | ICD-10-CM | POA: Diagnosis not present

## 2017-08-06 DIAGNOSIS — M25532 Pain in left wrist: Secondary | ICD-10-CM | POA: Diagnosis not present

## 2017-08-06 DIAGNOSIS — M25531 Pain in right wrist: Secondary | ICD-10-CM | POA: Diagnosis not present

## 2017-08-06 DIAGNOSIS — K5903 Drug induced constipation: Secondary | ICD-10-CM | POA: Diagnosis not present

## 2017-08-06 DIAGNOSIS — N186 End stage renal disease: Secondary | ICD-10-CM | POA: Diagnosis not present

## 2017-08-06 NOTE — Patient Outreach (Signed)
Care coordination: Placed call to patient to inquire about dialysis schedule to complete a home visit.  Dialysis schedule is Tuesday, Thursday, Saturday from 6a-10 am  PLAN: Booked initial home visit for 08/09/2017 at 12:00.   Tomasa Rand, RN, BSN, CEN [facility] [employer] [phone]

## 2017-08-09 ENCOUNTER — Ambulatory Visit: Payer: Self-pay | Admitting: *Deleted

## 2017-08-09 ENCOUNTER — Other Ambulatory Visit: Payer: Self-pay | Admitting: *Deleted

## 2017-08-09 ENCOUNTER — Other Ambulatory Visit: Payer: Self-pay

## 2017-08-09 NOTE — Patient Outreach (Signed)
[facility]) Care Management  08/09/2017  Crystal Clarke 1961-01-09 [id number]   This CSW attempted to reach pt today by phone and left HIPPA compliant voice message. CSW will ask Daneen Schick, BSW, to attempt outreach to her as well for community resources linking.   CSW will advise [facility] RNCM of above.   [name], MSW, [place] Worker  [place] [phone]

## 2017-08-09 NOTE — Patient Outreach (Signed)
[place] [facility]) Care Management   08/09/2017  Crystal Clarke 07-Aug-1960 [id number]  Crystal Clarke is an 57 y.o. female  Subjective: Patient reports that she was confused. Reports MD office called with elevated potassium. Reports she had chest pain and call 911. Went to [facility] and was having a heart attack. Sent to [facility] and ended up in renal failure and on dialysis.  Discharged to [facility] placed and stayed for 5 days and transferred to [facility] with wrist pain and pneumonia. Discharge home. Patient reports she is going to dialysis in [place] ( Tuesday, Thursday, Saturday schedule).  620 to 11am.  Reports ex husband is caregiver.   No new  Concerns today. Drives herself to dialysis.  Objective:  Awake and alert. Ambulating with walker.  Today's Vitals   08/09/17 1204 08/09/17 1207  BP: 138/70   Pulse: (!) 54   Resp: 18   SpO2: 98%   Weight: 135 lb (61.2 kg)   Height: 1.651 m (5' 5")   PainSc:  0-No pain   Review of Systems  Constitutional: Positive for malaise/fatigue.  Eyes:       Wearing glasses  Respiratory: Negative.   Cardiovascular: Negative.   Gastrointestinal: Positive for nausea.  Genitourinary:       Still has urine  Musculoskeletal: Positive for joint pain.       Left wrist pain,  Right leg shorter than the leg. Wears an elevated shoe  Skin:       Has a port to the right upper chest.   Neurological: Negative.   Endo/Heme/Allergies: Bruises/bleeds easily.    Physical Exam  Constitutional: She appears well-developed and well-nourished.  Cardiovascular: Normal rate, regular rhythm, normal heart sounds and intact distal pulses.  Respiratory: Effort normal and breath sounds normal.  GI: Soft. Bowel sounds are normal.  Musculoskeletal: Normal range of motion. She exhibits edema.  Right leg swelling 1 plus  Skin: Skin is warm and dry.  Psychiatric: She has a normal mood and affect. Her behavior is normal. Judgment and thought content normal.     Encounter Medications:   Outpatient Encounter Medications as of 08/09/2017  Medication Sig  . ALPRAZolam (XANAX) 0.5 MG tablet Take 0.5 mg by mouth 2 (two) times daily as needed.  Marland Kitchen aspirin 81 MG chewable tablet Chew 81 mg by mouth daily.   Marland Kitchen atorvastatin (LIPITOR) 40 MG tablet Take 40 mg by mouth daily.  . calcium carbonate (OSCAL) 1500 (600 Ca) MG TABS tablet Take 1 tablet by mouth daily.  . carvedilol (COREG) 6.25 MG tablet Take 6.25 mg by mouth 2 (two) times daily with a meal. For BP greater than 100.  . cholecalciferol (VITAMIN D) 1000 units tablet Take 2,000 Units by mouth daily.   . clopidogrel (PLAVIX) 75 MG tablet Take 1 tablet (75 mg total) by mouth daily.  . clopidogrel (PLAVIX) 75 MG tablet Take 75 mg by mouth daily.  Marland Kitchen dexlansoprazole (DEXILANT) 60 MG capsule Take 60 mg by mouth daily.  . diclofenac sodium (VOLTAREN) 1 % GEL Apply topically 4 (four) times daily.  Marland Kitchen ezetimibe (ZETIA) 10 MG tablet Take 10 mg by mouth daily.  . ferrous sulfate 325 (65 FE) MG tablet Take 325 mg by mouth daily with breakfast.   . FLUoxetine (PROZAC) 20 MG tablet Take 20 mg by mouth daily.   . hydroxychloroquine (PLAQUENIL) 200 MG tablet Take 1 tablet (200 mg total) by mouth daily.  . isosorbide mononitrate (IMDUR) 30 MG 24 hr tablet Take 30 mg  by mouth daily.  Marland Kitchen levothyroxine (SYNTHROID, LEVOTHROID) 75 MCG tablet Take 1 tablet (75 mcg total) by mouth daily before breakfast. (Patient taking differently: Take 100 mcg by mouth daily before breakfast. )  . oxyCODONE 30 MG 12 hr tablet Take 1 tablet by mouth 2 (two) times daily.  . predniSONE (DELTASONE) 10 MG tablet Take 4 tablets (40 mg total) by mouth daily with breakfast for 2 days, THEN 3 tablets (30 mg total) daily with breakfast for 2 days, THEN 2 tablets (20 mg total) daily with breakfast for 2 days, THEN 1 tablet (10 mg total) daily with breakfast for 2 days, THEN 0.5 tablets (5 mg total) daily with breakfast for 15 days.  Marland Kitchen QUEtiapine Fumarate  (SEROQUEL XR) 150 MG 24 hr tablet Take 150 mg by mouth at bedtime.  . ranitidine (ZANTAC) 150 MG capsule Take 150 mg by mouth at bedtime.  [name] (SENNA PLUS PO) Take 2 tablets by mouth 2 (two) times daily.  Marland Kitchen zolpidem (AMBIEN) 10 MG tablet Take 10 mg by mouth at bedtime as needed.  . nitroGLYCERIN (NITROSTAT) 0.4 MG SL tablet Place 0.4 mg under the tongue every 5 (five) minutes as needed.  . Oxycodone HCl 10 MG TABS Take 1 tablet by mouth 2 (two) times daily as needed.  . [DISCONTINUED] epoetin alfa (EPOGEN,PROCRIT) 69678 UNIT/ML injection Inject 20,000 Units into the skin. Every other week   No facility-administered encounter medications on file as of 08/09/2017.     Functional Status:   In your present state of health, do you have any difficulty performing the following activities: 08/09/2017 08/03/2017  Hearing? N N  Vision? N N  Difficulty concentrating or making decisions? N N  Walking or climbing stairs? Y Y  Dressing or bathing? N Y  Doing errands, shopping? N Y  Conservation officer, nature and eating ? N N  Using the Toilet? N N  In the past six months, have you accidently leaked urine? N N  Do you have problems with loss of bowel control? N N  Managing your Medications? N N  Managing your Finances? N N  Housekeeping or managing your Housekeeping? N Y  Some recent data might be hidden    Fall/Depression Screening:    Fall Risk  08/09/2017 08/03/2017 04/24/2017  Falls in the past year? Yes Yes Yes  Number falls in past yr: 2 or more 1 2 or more  Injury with Fall? No Yes Yes  Risk Factor Category  High Fall Risk High Fall Risk High Fall Risk  Risk for fall due to : - History of fall(s);Impaired balance/gait -  Follow up Falls evaluation completed;Falls prevention discussed Education provided;Falls prevention discussed Falls evaluation completed;Falls prevention discussed  Comment - - -   PHQ 2/9 Scores 08/09/2017 08/03/2017 04/24/2017 01/04/2017 12/28/2016  PHQ - 2 Score  0 0 0 2 3  PHQ- 9 Score - - - 3 10    Assessment:  (1) reviewed [facility] program.  Review transition of care program with weekly outreach calls. (2) kidney failure now on dialysis.  Meet Dr. Justin Mend today and will need permanent access for dailysis (3) takes medications as prescribed. (4) feels weak. (5) post discharge follow up with primary MD Plan:  (1) will call in 1 week.  Provided contact card again.  Welcome letter provided. (2) Reviewed importance of following daily diet. Reviewed benefits at dialysis center with social worker and case Freight forwarder.  Patient reports she meet case manager and social worker today.  (  3) reviewed medications and encouraged patient to take medications as prescribed.  (4) encouraged daily exercise. (5) encouraged follow up as planned.   Care planning and goal setting with patient during home visit.  Primary goal is to avoid a readmission.   THN CM Care Plan Problem One     Most Recent Value  Care Plan Problem One  Recent admission for joint swelling, new to dialysis, and pneumonia.   Role Documenting the Problem One  Care Management [place] for Problem One  Active  THN Long Term Goal   Patient will report no readmissions in the next 60 days.   THN Long Term Goal Start Date  08/03/17  Interventions for Problem One Long Term Goal  Reviewed plan of care.  Encouraged patient to continue to take her medications as prescribed.   THN CM Short Term Goal #1   Patient will report follow up with primary MD on 08/06/2017  [facility] CM Short Term Goal #1 Start Date  08/03/17  [facility] CM Short Term Goal #1 Met Date  08/09/17  [facility] CM Short Term Goal #2   Patient will report getting established with dialysis center in [place] within the next 5 days.  THN CM Short Term Goal #2 Start Date  08/03/17  [facility] CM Short Term Goal #2 Met Date  08/09/17    This note and barrier letter sent to MD.  Tomasa Rand, RN, BSN, [place] Coordinator [phone]

## 2017-08-15 ENCOUNTER — Other Ambulatory Visit: Payer: Self-pay

## 2017-08-15 ENCOUNTER — Ambulatory Visit: Payer: Self-pay

## 2017-08-15 NOTE — Patient Outreach (Signed)
[place] [facility]) Care Management  08/15/2017  Crystal Clarke 04/07/1960 [id number]   Telephone call to patient to follow up on referral for transportation. No answer. BSW left a HIPAA compliant voice message requesting a return call.  Plan: BSW will send patient an unsuccessful outreach letter. If no return call from patient, BSW will attempt patient again within four business days.  Daneen Schick, Arita Miss, CDP Social Worker [phone]

## 2017-08-15 NOTE — Patient Outreach (Signed)
Transition of care: Placed call to patient who answered and and reports she is doing well. Reports feeling good. Reports dialysis scheduled has changed to T, TH, Sat from 1130 am-4pm.  Reports this works better for her. Reports no new falls and no new concerns today.  PLAN: will continue weekly transition of care calls.  Tomasa Rand, RN, BSN, CEN [facility] [employer] [phone]

## 2017-08-16 ENCOUNTER — Other Ambulatory Visit: Payer: Self-pay

## 2017-08-16 ENCOUNTER — Ambulatory Visit: Payer: Self-pay

## 2017-08-16 DIAGNOSIS — N186 End stage renal disease: Secondary | ICD-10-CM

## 2017-08-16 DIAGNOSIS — Z01812 Encounter for preprocedural laboratory examination: Secondary | ICD-10-CM

## 2017-08-16 NOTE — Patient Outreach (Signed)
[place] [facility]) Care Management  08/16/2017  Poppy Mcafee [birth year] [id number]  Return call from patient, HIPAA identifiers verified. BSW explained reason for referral citing CSW, Nat Christen indicated patient needed transportation assistance. Patient states she is able to drive self and has her own vehicle. Patient interested in a transportation back-up for when "weather is bad". BSW to mail patient information on [facility]. BSW to contact patient in the next two weeks to discuss service.  Daneen Schick, Arita Miss, CDP Social Worker [phone]

## 2017-08-18 DIAGNOSIS — D509 Iron deficiency anemia, unspecified: Secondary | ICD-10-CM | POA: Diagnosis not present

## 2017-08-18 DIAGNOSIS — R52 Pain, unspecified: Secondary | ICD-10-CM | POA: Diagnosis not present

## 2017-08-18 DIAGNOSIS — E876 Hypokalemia: Secondary | ICD-10-CM | POA: Diagnosis not present

## 2017-08-18 DIAGNOSIS — Z452 Encounter for adjustment and management of vascular access device: Secondary | ICD-10-CM | POA: Diagnosis not present

## 2017-08-18 DIAGNOSIS — Z23 Encounter for immunization: Secondary | ICD-10-CM | POA: Diagnosis not present

## 2017-08-18 DIAGNOSIS — D631 Anemia in chronic kidney disease: Secondary | ICD-10-CM | POA: Diagnosis not present

## 2017-08-18 DIAGNOSIS — N186 End stage renal disease: Secondary | ICD-10-CM | POA: Diagnosis not present

## 2017-08-18 DIAGNOSIS — D689 Coagulation defect, unspecified: Secondary | ICD-10-CM | POA: Diagnosis not present

## 2017-08-19 DIAGNOSIS — Z96652 Presence of left artificial knee joint: Secondary | ICD-10-CM | POA: Diagnosis not present

## 2017-08-19 DIAGNOSIS — T8454XD Infection and inflammatory reaction due to internal left knee prosthesis, subsequent encounter: Secondary | ICD-10-CM | POA: Diagnosis not present

## 2017-08-21 ENCOUNTER — Ambulatory Visit: Payer: Self-pay

## 2017-08-22 DIAGNOSIS — E038 Other specified hypothyroidism: Secondary | ICD-10-CM | POA: Diagnosis not present

## 2017-08-22 DIAGNOSIS — E782 Mixed hyperlipidemia: Secondary | ICD-10-CM | POA: Diagnosis not present

## 2017-08-22 DIAGNOSIS — M3214 Glomerular disease in systemic lupus erythematosus: Secondary | ICD-10-CM | POA: Diagnosis not present

## 2017-08-22 DIAGNOSIS — N186 End stage renal disease: Secondary | ICD-10-CM | POA: Diagnosis not present

## 2017-08-22 DIAGNOSIS — R7301 Impaired fasting glucose: Secondary | ICD-10-CM | POA: Diagnosis not present

## 2017-08-22 DIAGNOSIS — I119 Hypertensive heart disease without heart failure: Secondary | ICD-10-CM | POA: Diagnosis not present

## 2017-08-23 ENCOUNTER — Ambulatory Visit: Payer: Self-pay

## 2017-08-23 ENCOUNTER — Other Ambulatory Visit: Payer: Self-pay

## 2017-08-23 NOTE — Patient Outreach (Signed)
Transition of care call:  Placed call to patient who reports she is at dialysis. Reports no new problems. States she is having some left hip pain from sitting for 4 hours having dialysis. Reports she will bring a pillow with her next time to see if that helps.  Reports seeing Dr. Tobie Poet yesterday and had labs drawn.  Denies any recent falls.  PLAN: will call patient back in 1 week. Encouraged patient to call sooner if needed.  Tomasa Rand, RN, BSN, CEN [facility] [employer] [phone]

## 2017-08-30 ENCOUNTER — Other Ambulatory Visit: Payer: Self-pay

## 2017-08-30 NOTE — Patient Outreach (Signed)
Transition of care call:  Placed call to patient and left a message. Requested a call back. Patient returned call and states that she is at dialysis. Reports everything is going well. Denies any new concerns except a cough. Patient states she called Dr. Tobie Poet and she was called in tessalon pearls for cough.   PLAN: will follow up with patient in 1 week.  Tomasa Rand, RN, BSN, CEN [facility] [employer] [phone]

## 2017-08-30 NOTE — Patient Outreach (Signed)
[place] [facility]) Care Management  08/30/2017  Marelyn Rouser 1960-12-06 [id number]  BSW contacted patient to confirm patient received [facility] information in the mail. Patient confirmed she did receive information. BSW went over process with patient of how to contact [facility] and enroll in service. Patient voiced understanding. Patient thanked BSW for resources and denied any other social work needs. BSW to perform discipline closure. BSW to update patients RN CM, Tomasa Rand of social work closure.  Daneen Schick, Arita Miss, CDP Social Worker [phone]

## 2017-09-05 ENCOUNTER — Other Ambulatory Visit: Payer: Self-pay

## 2017-09-05 NOTE — Patient Outreach (Signed)
Transition of care: Placed call to patient for transition of care. No answer. Left a message requesting a call back.  Incoming call and voicemail from patient stating she is doing well. No new problems or concerns.   PLAN: Patient has completed transition of care program without a readmission.  Will contact patient again in 30 days.   Tomasa Rand, RN, BSN, CEN [facility] [employer] [phone]

## 2017-09-06 ENCOUNTER — Ambulatory Visit (HOSPITAL_COMMUNITY): Admission: RE | Admit: 2017-09-06 | Payer: PPO | Source: Ambulatory Visit

## 2017-09-06 ENCOUNTER — Encounter: Payer: PPO | Admitting: Vascular Surgery

## 2017-09-06 ENCOUNTER — Ambulatory Visit (HOSPITAL_COMMUNITY): Payer: PPO

## 2017-09-16 DIAGNOSIS — N186 End stage renal disease: Secondary | ICD-10-CM | POA: Diagnosis not present

## 2017-09-16 DIAGNOSIS — Z992 Dependence on renal dialysis: Secondary | ICD-10-CM | POA: Diagnosis not present

## 2017-09-16 DIAGNOSIS — M3210 Systemic lupus erythematosus, organ or system involvement unspecified: Secondary | ICD-10-CM | POA: Diagnosis not present

## 2017-09-18 DIAGNOSIS — E876 Hypokalemia: Secondary | ICD-10-CM | POA: Diagnosis not present

## 2017-09-18 DIAGNOSIS — D689 Coagulation defect, unspecified: Secondary | ICD-10-CM | POA: Diagnosis not present

## 2017-09-18 DIAGNOSIS — Z96652 Presence of left artificial knee joint: Secondary | ICD-10-CM | POA: Diagnosis not present

## 2017-09-18 DIAGNOSIS — Z452 Encounter for adjustment and management of vascular access device: Secondary | ICD-10-CM | POA: Diagnosis not present

## 2017-09-18 DIAGNOSIS — N186 End stage renal disease: Secondary | ICD-10-CM | POA: Diagnosis not present

## 2017-09-18 DIAGNOSIS — R52 Pain, unspecified: Secondary | ICD-10-CM | POA: Diagnosis not present

## 2017-09-18 DIAGNOSIS — D631 Anemia in chronic kidney disease: Secondary | ICD-10-CM | POA: Diagnosis not present

## 2017-09-18 DIAGNOSIS — T8454XD Infection and inflammatory reaction due to internal left knee prosthesis, subsequent encounter: Secondary | ICD-10-CM | POA: Diagnosis not present

## 2017-09-21 ENCOUNTER — Ambulatory Visit (HOSPITAL_COMMUNITY)
Admission: RE | Admit: 2017-09-21 | Discharge: 2017-09-21 | Disposition: A | Discharge status: Home / Self Care | Payer: PPO | Source: Ambulatory Visit | Attending: Vascular Surgery | Admitting: Vascular Surgery

## 2017-09-21 DIAGNOSIS — N186 End stage renal disease: Secondary | ICD-10-CM

## 2017-09-21 DIAGNOSIS — Z4931 Encounter for adequacy testing for hemodialysis: Secondary | ICD-10-CM | POA: Insufficient documentation

## 2017-09-21 DIAGNOSIS — Z01812 Encounter for preprocedural laboratory examination: Secondary | ICD-10-CM | POA: Diagnosis not present

## 2017-09-27 ENCOUNTER — Ambulatory Visit (INDEPENDENT_AMBULATORY_CARE_PROVIDER_SITE_OTHER): Payer: PPO | Admitting: Vascular Surgery

## 2017-09-27 ENCOUNTER — Other Ambulatory Visit: Payer: Self-pay

## 2017-09-27 ENCOUNTER — Encounter: Payer: Self-pay | Admitting: Vascular Surgery

## 2017-09-27 ENCOUNTER — Other Ambulatory Visit: Payer: Self-pay | Admitting: *Deleted

## 2017-09-27 ENCOUNTER — Encounter: Payer: Self-pay | Admitting: *Deleted

## 2017-09-27 VITALS — BP 92/63 | HR 67 | Temp 97.0°F | Resp 18 | Ht 65.0 in | Wt 127.5 lb

## 2017-09-27 DIAGNOSIS — N186 End stage renal disease: Secondary | ICD-10-CM | POA: Diagnosis not present

## 2017-09-27 DIAGNOSIS — Z992 Dependence on renal dialysis: Secondary | ICD-10-CM

## 2017-09-27 NOTE — H&P (View-Only) (Signed)
VASCULAR [facility] VEIN SPECIALISTS OF  HISTORY AND PHYSICAL   History of Present Illness:  Patient is a 57 y.o. year old female who presents for placement of a permanent hemodialysis access. The patient is right handed.  The patient is currently on hemodialysis.  He is using a right sided catheter.  She has not had any prior access procedures.  She did have a Hickman catheter on the left side in the past.  She does not have any chronic left arm swelling.  Other chronic medical problems include anemia, congestive heart failure, hyperlipidemia, hypertension, coronary artery disease. Cause of her renal failure was thought to be secondary to lupus.  She is on Plavix and aspirin for her coronary disease.  Past Medical History:  Diagnosis Date  . Anemia   . Anxiety   . Arthritis   . Blood transfusion without reported diagnosis   . CHF (congestive heart failure) ([place])   . Chronic kidney disease   . Depression   . GERD (gastroesophageal reflux disease)   . Heart murmur   . Hyperlipidemia   . Hypertension   . Lupus (systemic lupus erythematosus) ([place])   . 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 Myocardial infarction ([place])   . Osteoporosis   . Stroke ([place])   . Thyroid disease     Past Surgical History:  Procedure Laterality Date  . CORONARY ARTERY BYPASS GRAFT    . JOINT REPLACEMENT     right knee, left knee and left hip     Social History Social History   Tobacco Use  . Smoking status: Never Smoker  . Smokeless tobacco: Never Used  Substance Use Topics  . Alcohol use: No  . Drug use: No    Family History History reviewed. No pertinent family history.  Allergies  Allergies  Allergen Reactions  . Ace Inhibitors   . Ancef [Cefazolin]   . Sulfa Antibiotics   . Vancomycin     Hx Red Man Syndrome     Current Outpatient Medications  Medication Sig Dispense Refill  . ALPRAZolam (XANAX) 0.5 MG tablet Take 0.5 mg by mouth 2 (two) times daily as needed.    Marland Kitchen aspirin  81 MG chewable tablet Chew 81 mg by mouth daily.     Marland Kitchen atorvastatin (LIPITOR) 40 MG tablet Take 40 mg by mouth daily.    . clopidogrel (PLAVIX) 75 MG tablet Take 1 tablet (75 mg total) by mouth daily. 30 tablet 11  . clopidogrel (PLAVIX) 75 MG tablet Take 75 mg by mouth daily.    Marland Kitchen dexlansoprazole (DEXILANT) 60 MG capsule Take 60 mg by mouth daily.    . diclofenac sodium (VOLTAREN) 1 % GEL Apply topically 4 (four) times daily.    Marland Kitchen ezetimibe (ZETIA) 10 MG tablet Take 10 mg by mouth daily.    Marland Kitchen FLUoxetine (PROZAC) 20 MG tablet Take 20 mg by mouth daily.     Marland Kitchen levothyroxine (SYNTHROID, LEVOTHROID) 75 MCG tablet Take 1 tablet (75 mcg total) by mouth daily before breakfast. (Patient taking differently: Take 100 mcg by mouth daily before breakfast. ) 30 tablet 0  . nitroGLYCERIN (NITROSTAT) 0.4 MG SL tablet Place 0.4 mg under the tongue every 5 (five) minutes as needed.    Marland Kitchen oxyCODONE 30 MG 12 hr tablet Take 1 tablet by mouth 2 (two) times daily.    . Oxycodone HCl 10 MG TABS Take 1 tablet by mouth 2 (two) times daily as needed.    Marland Kitchen QUEtiapine Fumarate (  SEROQUEL XR) 150 MG 24 hr tablet Take 150 mg by mouth at bedtime.    . ranitidine (ZANTAC) 150 MG capsule Take 150 mg by mouth at bedtime.    [name] (SENNA PLUS PO) Take 2 tablets by mouth 2 (two) times daily.    Marland Kitchen zolpidem (AMBIEN) 10 MG tablet Take 10 mg by mouth at bedtime as needed.    . calcium carbonate (OSCAL) 1500 (600 Ca) MG TABS tablet Take 1 tablet by mouth daily.    . carvedilol (COREG) 6.25 MG tablet Take 6.25 mg by mouth 2 (two) times daily with a meal. For BP greater than 100.    . cholecalciferol (VITAMIN D) 1000 units tablet Take 2,000 Units by mouth daily.     . ferrous sulfate 325 (65 FE) MG tablet Take 325 mg by mouth daily with breakfast.     . isosorbide mononitrate (IMDUR) 30 MG 24 hr tablet Take 30 mg by mouth daily.     No current facility-administered medications for this visit.     ROS:    General:  No weight loss, Fever, chills  HEENT: No recent headaches, no nasal bleeding, no visual changes, no sore throat  Neurologic: No dizziness, blackouts, seizures. No recent symptoms of stroke or mini- stroke. No recent episodes of slurred speech, or temporary blindness.  Cardiac: No recent episodes of chest pain/pressure, no shortness of breath at rest.  + shortness of breath with exertion.  Denies history of atrial fibrillation or irregular heartbeat  Vascular: No history of rest pain in feet.  No history of claudication.  No history of non-healing ulcer, No history of DVT   Pulmonary: No home oxygen, no productive cough, no hemoptysis,  No asthma or wheezing  Musculoskeletal:  [ ]  Arthritis, [ ]  Low back pain,  [ ]  Joint pain  Hematologic:No history of hypercoagulable state.  No history of easy bleeding.  No history of anemia  Gastrointestinal: No hematochezia or melena,  No gastroesophageal reflux, no trouble swallowing  Urinary: [ ]  chronic Kidney disease, [ ]  on HD - [ ]  MWF or [x ] TTHS, [ ]  Burning with urination, [ ]  Frequent urination, [ ]  Difficulty urinating;   Skin: No rashes  Psychological: No history of anxiety,  No history of depression   Physical Examination  Vitals:   09/27/17 1421  BP: 92/63  Pulse: 67  Resp: 18  Temp: (!) 97 F (36.1 C)  TempSrc: Oral  SpO2: (!) 87%  Weight: 127 lb 8 oz (57.8 kg)  Height: 5\' 5"  (1.651 m)    Body mass index is 21.22 kg/m.  General:  Alert and oriented, no acute distress HEENT: Normal Neck: No bruit or JVD Pulmonary: Clear to auscultation bilaterally Cardiac: Regular Rate and Rhythm without murmur Gastrointestinal: Soft, non-tender, non-distended Skin: No rash Extremity Pulses:  2+ radial, brachial pulses bilaterally Musculoskeletal: No deformity or edema  Neurologic: Upper and lower extremity motor 5/5 and symmetric  DATA:  She had a vein mapping ultrasound arterial upper extremity duplex today.   Arterial duplex shows a very small radial artery bilaterally.  Brachial artery is about 3 mm in diameter.  Vein ultrasound shows diminutive cephalic and basilic veins bilaterally.  I reviewed and interpreted the study.  ASSESSMENT: Patient needs long-term hemodialysis access.  She has no adequate veins for fistula placement.   PLAN: Left upper arm AV graft to be placed on October 03, 2017.  Risk benefits possible complications of procedure details including but not  limited to bleeding infection ischemic steal graft thrombosis requirement for other future procedures were described to the patient and her husband today.  They understand and agree to proceed.  Ruta Hinds, MD [facility] [place] Office: [phone] Pager: [phone]

## 2017-09-27 NOTE — Progress Notes (Signed)
[facility] [place] HISTORY AND PHYSICAL   History of Present Illness:  Patient is a 57 y.o. year old female who presents for placement of a permanent hemodialysis access. The patient is right handed.  The patient is currently on hemodialysis.  He is using a right sided catheter.  She has not had any prior access procedures.  She did have a Hickman catheter on the left side in the past.  She does not have any chronic left arm swelling.  Other chronic medical problems include anemia, congestive heart failure, hyperlipidemia, hypertension, coronary artery disease. Cause of her renal failure was thought to be secondary to lupus.  She is on Plavix and aspirin for her coronary disease.  Past Medical History:  Diagnosis Date  . Anemia   . Anxiety   . Arthritis   . Blood transfusion without reported diagnosis   . CHF (congestive heart failure) ([place])   . Chronic kidney disease   . Depression   . GERD (gastroesophageal reflux disease)   . Heart murmur   . Hyperlipidemia   . Hypertension   . Lupus (systemic lupus erythematosus) ([place])   . 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 Myocardial infarction ([place])   . Osteoporosis   . Stroke ([place])   . Thyroid disease     Past Surgical History:  Procedure Laterality Date  . CORONARY ARTERY BYPASS GRAFT    . JOINT REPLACEMENT     right knee, left knee and left hip     Social History Social History   Tobacco Use  . Smoking status: Never Smoker  . Smokeless tobacco: Never Used  Substance Use Topics  . Alcohol use: No  . Drug use: No    Family History History reviewed. No pertinent family history.  Allergies  Allergies  Allergen Reactions  . Ace Inhibitors   . Ancef [Cefazolin]   . Sulfa Antibiotics   . Vancomycin     Hx Red Man Syndrome     Current Outpatient Medications  Medication Sig Dispense Refill  . ALPRAZolam (XANAX) 0.5 MG tablet Take 0.5 mg by mouth 2 (two) times daily as needed.    Marland Kitchen aspirin  81 MG chewable tablet Chew 81 mg by mouth daily.     Marland Kitchen atorvastatin (LIPITOR) 40 MG tablet Take 40 mg by mouth daily.    . clopidogrel (PLAVIX) 75 MG tablet Take 1 tablet (75 mg total) by mouth daily. 30 tablet 11  . clopidogrel (PLAVIX) 75 MG tablet Take 75 mg by mouth daily.    Marland Kitchen dexlansoprazole (DEXILANT) 60 MG capsule Take 60 mg by mouth daily.    . diclofenac sodium (VOLTAREN) 1 % GEL Apply topically 4 (four) times daily.    Marland Kitchen ezetimibe (ZETIA) 10 MG tablet Take 10 mg by mouth daily.    Marland Kitchen FLUoxetine (PROZAC) 20 MG tablet Take 20 mg by mouth daily.     Marland Kitchen levothyroxine (SYNTHROID, LEVOTHROID) 75 MCG tablet Take 1 tablet (75 mcg total) by mouth daily before breakfast. (Patient taking differently: Take 100 mcg by mouth daily before breakfast. ) 30 tablet 0  . nitroGLYCERIN (NITROSTAT) 0.4 MG SL tablet Place 0.4 mg under the tongue every 5 (five) minutes as needed.    Marland Kitchen oxyCODONE 30 MG 12 hr tablet Take 1 tablet by mouth 2 (two) times daily.    . Oxycodone HCl 10 MG TABS Take 1 tablet by mouth 2 (two) times daily as needed.    Marland Kitchen QUEtiapine Fumarate (  SEROQUEL XR) 150 MG 24 hr tablet Take 150 mg by mouth at bedtime.    . ranitidine (ZANTAC) 150 MG capsule Take 150 mg by mouth at bedtime.    [name] (SENNA PLUS PO) Take 2 tablets by mouth 2 (two) times daily.    Marland Kitchen zolpidem (AMBIEN) 10 MG tablet Take 10 mg by mouth at bedtime as needed.    . calcium carbonate (OSCAL) 1500 (600 Ca) MG TABS tablet Take 1 tablet by mouth daily.    . carvedilol (COREG) 6.25 MG tablet Take 6.25 mg by mouth 2 (two) times daily with a meal. For BP greater than 100.    . cholecalciferol (VITAMIN D) 1000 units tablet Take 2,000 Units by mouth daily.     . ferrous sulfate 325 (65 FE) MG tablet Take 325 mg by mouth daily with breakfast.     . isosorbide mononitrate (IMDUR) 30 MG 24 hr tablet Take 30 mg by mouth daily.     No current facility-administered medications for this visit.     ROS:    General:  No weight loss, Fever, chills  HEENT: No recent headaches, no nasal bleeding, no visual changes, no sore throat  Neurologic: No dizziness, blackouts, seizures. No recent symptoms of stroke or mini- stroke. No recent episodes of slurred speech, or temporary blindness.  Cardiac: No recent episodes of chest pain/pressure, no shortness of breath at rest.  + shortness of breath with exertion.  Denies history of atrial fibrillation or irregular heartbeat  Vascular: No history of rest pain in feet.  No history of claudication.  No history of non-healing ulcer, No history of DVT   Pulmonary: No home oxygen, no productive cough, no hemoptysis,  No asthma or wheezing  Musculoskeletal:  [ ]  Arthritis, [ ]  Low back pain,  [ ]  Joint pain  Hematologic:No history of hypercoagulable state.  No history of easy bleeding.  No history of anemia  Gastrointestinal: No hematochezia or melena,  No gastroesophageal reflux, no trouble swallowing  Urinary: [ ]  chronic Kidney disease, [ ]  on HD - [ ]  MWF or [x ] TTHS, [ ]  Burning with urination, [ ]  Frequent urination, [ ]  Difficulty urinating;   Skin: No rashes  Psychological: No history of anxiety,  No history of depression   Physical Examination  Vitals:   09/27/17 1421  BP: 92/63  Pulse: 67  Resp: 18  Temp: (!) 97 F (36.1 C)  TempSrc: Oral  SpO2: (!) 87%  Weight: 127 lb 8 oz (57.8 kg)  Height: 5\' 5"  (1.651 m)    Body mass index is 21.22 kg/m.  General:  Alert and oriented, no acute distress HEENT: Normal Neck: No bruit or JVD Pulmonary: Clear to auscultation bilaterally Cardiac: Regular Rate and Rhythm without murmur Gastrointestinal: Soft, non-tender, non-distended Skin: No rash Extremity Pulses:  2+ radial, brachial pulses bilaterally Musculoskeletal: No deformity or edema  Neurologic: Upper and lower extremity motor 5/5 and symmetric  DATA:  She had a vein mapping ultrasound arterial upper extremity duplex today.   Arterial duplex shows a very small radial artery bilaterally.  Brachial artery is about 3 mm in diameter.  Vein ultrasound shows diminutive cephalic and basilic veins bilaterally.  I reviewed and interpreted the study.  ASSESSMENT: Patient needs long-term hemodialysis access.  She has no adequate veins for fistula placement.   PLAN: Left upper arm AV graft to be placed on October 03, 2017.  Risk benefits possible complications of procedure details including but not  limited to bleeding infection ischemic steal graft thrombosis requirement for other future procedures were described to the patient and her husband today.  They understand and agree to proceed.  Ruta Hinds, MD [facility] [place] Office: [phone] Pager: [phone]

## 2017-10-01 DIAGNOSIS — Z6821 Body mass index (BMI) 21.0-21.9, adult: Secondary | ICD-10-CM | POA: Diagnosis not present

## 2017-10-01 DIAGNOSIS — Z0000 Encounter for general adult medical examination without abnormal findings: Secondary | ICD-10-CM | POA: Diagnosis not present

## 2017-10-02 ENCOUNTER — Encounter (HOSPITAL_COMMUNITY): Payer: Self-pay | Admitting: *Deleted

## 2017-10-02 ENCOUNTER — Other Ambulatory Visit: Payer: Self-pay

## 2017-10-02 MED ORDER — CIPROFLOXACIN IN D5W 400 MG/200ML IV SOLN
400.0000 mg | INTRAVENOUS | Status: AC
  Administered 2017-10-03: 400 mg via INTRAVENOUS
  Filled 2017-10-02: qty 200

## 2017-10-02 NOTE — Progress Notes (Signed)
Anesthesia Chart Review: same day workup   Case:  [id number] Date/Time:  10/03/17 1010   Procedure:  INSERTION OF ARTERIOVENOUS (AV) GORE-TEX GRAFT ARM (Left )   Anesthesia type:  Choice   Pre-op diagnosis:  end stage renal disease   Location:  [facility] OR ROOM 11 / [facility]   Surgeon:  Elam Dutch, MD      DISCUSSION:57 y.o. year old female never smoker scheduled for above procedure. Pertinent medical hx includes ESRD on HD T/Th/S, anemia, congestive heart failure, hyperlipidemia, hypertension, coronary artery disease (s/p CABG 1998, s/p NSTEMI 07/17/2017), hypothyroid, bilateral TKR, Left THA s/p closed left hip reduction 03/2017, chronic right hip dislocation/AVN with seroma.  She is on Plavix and aspirin for her coronary disease. Pt will stop Plavix on Friday 09-28-17 but will continue her ASA per Dr. Oneida Alar  Pt hospitalized at [facility] 4/30-07/23/2017 for NSTEMI. This MI caused acute on chronic kidney injury 2/2 SLE nephritis and resulted in her being started on HD. Heart cath deferred initially due to kidney function and pt treated medically. She was started on HD and cath was again discussed but pt refused at that time. ECHO performed in hospital showed EF 55-60%, see full results below. It does not appear that pt has followed up with cardiology since discharge.  Pt hospitalized at [facility] 5/13-5/16/2019 for SLE flare and anemia with hgb 5.8 on admission (improved to 8.5 after 2 unit PRBC). During this admission there was some question about possible subsegmental atelectasis or early pneumonia in the left lower lobe. She was not having any pulmonary symptoms at the time and it was recommended to have f/u CXR in 3-4 weeks but it does not appear this was done.   Case discussed with Dr. Marcell Barlow who recommended case but done under MAC. She will need DOS eval by anesthesiologist. Anticipate she can proceed as planned barring acute status change.  VS: There were no vitals taken for this visit.  PROVIDERSRochel Brome, MD  Tristan Schroeder MD is Cardiologist last seen 01/19/2015  Thapa, Rupak es Rheumatologist lat seen 04/02/2017  LABS: Labs reviewed: Acceptable for surgery. and Pt with known anemia, lab values appear stable  Lab Results  Component Value Date   WBC 6.1 08/02/2017   HGB 8.5 (L) 08/02/2017   HCT 27.6 (L) 08/02/2017   PLT 265 08/02/2017   GLUCOSE 88 08/02/2017   ALT 9 (L) 07/30/2017   AST 23 07/30/2017   NA 143 08/02/2017   K 3.3 (L) 08/02/2017   CL 106 08/02/2017   CREATININE 4.63 (H) 08/02/2017   BUN 51 (H) 08/02/2017   CO2 25 08/02/2017   INR 1.30 07/30/2017   CBC Latest Ref Rng & Units 08/02/2017 08/01/2017 07/31/2017  WBC 4.0 - 10.5 K/uL 6.1 6.1 5.6  Hemoglobin 12.0 - 15.0 g/dL 8.5(L) 8.5(L) 8.8(L)  Hematocrit 36.0 - 46.0 % 27.6(L) 28.0(L) 28.2(L)  Platelets 150 - 400 K/uL 265 246 246     IMAGES: CHEST - 2 VIEW 07/30/2017 COMPARISON:  None in PACs FINDINGS: The lungs are adequately inflated. The lung markings are coarse in the retrocardiac region on the left. There is overlying cardiac monitoring caval and electrode however. There is no pleural effusion. The heart and pulmonary vascularity are normal. There are post CABG changes. There is calcification in the wall of the aortic arch. The dual-lumen dialysis catheter tip projects over the distal third of the SVC. The observed bony thorax is unremarkable.  IMPRESSION: Possible subsegmental atelectasis or early pneumonia  in the left lower lobe. No CHF or pleural effusion. Followup PA and lateral chest X-ray is recommended in 3-4 weeks following trial of antibiotic therapy to ensure resolution and exclude underlying malignancy.  Previous CABG.  Thoracic aortic atherosclerosis.   EKG:  07/30/2017: Sinus rhythm. Atrial premature complex. Abnormal T, consider ischemia, lateral leads  CV: ECHO 07/17/2017 ([place]): SUMMARY The left ventricular size is normal. There is normal left ventricular  wall  thickness. Left ventricular systolic function is normal. LV ejection fraction  = 55-60%. The right ventricle is normal in size and function. There is no pericardial effusion. There is mild tricuspid regurgitation. There is no significant change in comparison with the last study. - FINDINGS:  LEFT VENTRICLE The left ventricular size is normal. There is normal left ventricular wall  thickness. Left ventricular systolic function is normal. LV ejection fraction  = 55-60%. Left ventricular filling pattern is indeterminate. No segmental  wall motion abnormalities seen in the left ventricle.  Past Medical History:  Diagnosis Date  . Anemia   . Anxiety   . Arthritis   . Blood transfusion without reported diagnosis   . CHF (congestive heart failure) ([place])   . Chronic kidney disease   . Depression   . GERD (gastroesophageal reflux disease)   . Headache   . Heart murmur   . Hyperlipidemia   . Hypertension   . Lupus (systemic lupus erythematosus) ([place])   . 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 Myocardial infarction ([place])   . Osteoporosis   . PONV (postoperative nausea and vomiting)   . Pre-diabetes   . Stroke ([place])   . Thyroid disease     Past Surgical History:  Procedure Laterality Date  . BREAST SURGERY    . COLONOSCOPY    . CORONARY ARTERY BYPASS GRAFT    . JOINT REPLACEMENT     right knee, left knee and left hip  . WISDOM TOOTH EXTRACTION      MEDICATIONS: No current facility-administered medications for this encounter.    Marland Kitchen ALPRAZolam (XANAX) 0.5 MG tablet  . ARTIFICIAL TEAR OP  . aspirin EC 81 MG tablet  . atorvastatin (LIPITOR) 40 MG tablet  . dexlansoprazole (DEXILANT) 60 MG capsule  . diclofenac sodium (VOLTAREN) 1 % GEL  . ezetimibe (ZETIA) 10 MG tablet  . FLUoxetine (PROZAC) 10 MG tablet  . hydroxychloroquine (PLAQUENIL) 200 MG tablet  . levothyroxine (SYNTHROID, LEVOTHROID) 100 MCG tablet  . MOVANTIK 25 MG TABS tablet  . Multiple Vitamin  (DAILY VITE) TABS  . nitroGLYCERIN (NITROSTAT) 0.4 MG SL tablet  . oxyCODONE 30 MG 12 hr tablet  . Oxycodone HCl 10 MG TABS  . QUEtiapine Fumarate (SEROQUEL XR) 150 MG 24 hr tablet  . ranitidine (ZANTAC) 150 MG capsule  . zolpidem (AMBIEN) 10 MG tablet  . clopidogrel (PLAVIX) 75 MG tablet  . levothyroxine (SYNTHROID, LEVOTHROID) 75 MCG tablet  . Sennosides-Docusate Sodium (SENNA PLUS PO)    Wynonia Musty [facility] Short Stay Center/Anesthesiology Phone [phone] 10/02/2017 9:41 AM

## 2017-10-02 NOTE — Progress Notes (Signed)
Pt denies SOB, chest pain, and being under the care of a cardiologist. Pt denies having a cardiac cath but stated that a nuclear stress test was performed by Dr. Agustin Cree, at [facility] ; records requested. Pt stated that she is pre-diabetic and does not check her blood glucose. Pt stated that her last dose of Plavix was Friday as instructed. Anesthesia asked to review pt history. Pt verbalized understanding of all pre-op instructions.

## 2017-10-03 ENCOUNTER — Telehealth: Payer: Self-pay | Admitting: Vascular Surgery

## 2017-10-03 ENCOUNTER — Encounter (HOSPITAL_COMMUNITY): Payer: Self-pay | Admitting: Surgery

## 2017-10-03 ENCOUNTER — Ambulatory Visit (HOSPITAL_COMMUNITY): Payer: PPO | Admitting: Physician Assistant

## 2017-10-03 ENCOUNTER — Ambulatory Visit (HOSPITAL_COMMUNITY)
Admission: RE | Admit: 2017-10-03 | Discharge: 2017-10-03 | Disposition: A | Discharge status: Home / Self Care | Payer: PPO | Source: Ambulatory Visit | Attending: Vascular Surgery | Admitting: Vascular Surgery

## 2017-10-03 ENCOUNTER — Encounter (HOSPITAL_COMMUNITY)
Admission: RE | Disposition: A | Discharge status: Home / Self Care | Payer: Self-pay | Source: Ambulatory Visit | Attending: Vascular Surgery

## 2017-10-03 DIAGNOSIS — Z951 Presence of aortocoronary bypass graft: Secondary | ICD-10-CM | POA: Insufficient documentation

## 2017-10-03 DIAGNOSIS — Z7901 Long term (current) use of anticoagulants: Secondary | ICD-10-CM | POA: Insufficient documentation

## 2017-10-03 DIAGNOSIS — D649 Anemia, unspecified: Secondary | ICD-10-CM | POA: Diagnosis not present

## 2017-10-03 DIAGNOSIS — Z992 Dependence on renal dialysis: Secondary | ICD-10-CM | POA: Insufficient documentation

## 2017-10-03 DIAGNOSIS — Z8673 Personal history of transient ischemic attack (TIA), and cerebral infarction without residual deficits: Secondary | ICD-10-CM | POA: Insufficient documentation

## 2017-10-03 DIAGNOSIS — M810 Age-related osteoporosis without current pathological fracture: Secondary | ICD-10-CM | POA: Insufficient documentation

## 2017-10-03 DIAGNOSIS — F419 Anxiety disorder, unspecified: Secondary | ICD-10-CM | POA: Insufficient documentation

## 2017-10-03 DIAGNOSIS — I120 Hypertensive chronic kidney disease with stage 5 chronic kidney disease or end stage renal disease: Secondary | ICD-10-CM | POA: Diagnosis not present

## 2017-10-03 DIAGNOSIS — Z888 Allergy status to other drugs, medicaments and biological substances status: Secondary | ICD-10-CM | POA: Insufficient documentation

## 2017-10-03 DIAGNOSIS — K219 Gastro-esophageal reflux disease without esophagitis: Secondary | ICD-10-CM | POA: Insufficient documentation

## 2017-10-03 DIAGNOSIS — Z79890 Hormone replacement therapy (postmenopausal): Secondary | ICD-10-CM | POA: Diagnosis not present

## 2017-10-03 DIAGNOSIS — Z882 Allergy status to sulfonamides status: Secondary | ICD-10-CM | POA: Diagnosis not present

## 2017-10-03 DIAGNOSIS — N185 Chronic kidney disease, stage 5: Secondary | ICD-10-CM | POA: Diagnosis not present

## 2017-10-03 DIAGNOSIS — Z7982 Long term (current) use of aspirin: Secondary | ICD-10-CM | POA: Insufficient documentation

## 2017-10-03 DIAGNOSIS — M1990 Unspecified osteoarthritis, unspecified site: Secondary | ICD-10-CM | POA: Insufficient documentation

## 2017-10-03 DIAGNOSIS — M3214 Glomerular disease in systemic lupus erythematosus: Secondary | ICD-10-CM | POA: Insufficient documentation

## 2017-10-03 DIAGNOSIS — Z96653 Presence of artificial knee joint, bilateral: Secondary | ICD-10-CM | POA: Insufficient documentation

## 2017-10-03 DIAGNOSIS — Z881 Allergy status to other antibiotic agents status: Secondary | ICD-10-CM | POA: Insufficient documentation

## 2017-10-03 DIAGNOSIS — Z79899 Other long term (current) drug therapy: Secondary | ICD-10-CM | POA: Diagnosis not present

## 2017-10-03 DIAGNOSIS — I252 Old myocardial infarction: Secondary | ICD-10-CM | POA: Insufficient documentation

## 2017-10-03 DIAGNOSIS — I509 Heart failure, unspecified: Secondary | ICD-10-CM | POA: Insufficient documentation

## 2017-10-03 DIAGNOSIS — M329 Systemic lupus erythematosus, unspecified: Secondary | ICD-10-CM | POA: Insufficient documentation

## 2017-10-03 DIAGNOSIS — I132 Hypertensive heart and chronic kidney disease with heart failure and with stage 5 chronic kidney disease, or end stage renal disease: Secondary | ICD-10-CM | POA: Insufficient documentation

## 2017-10-03 DIAGNOSIS — N186 End stage renal disease: Secondary | ICD-10-CM | POA: Insufficient documentation

## 2017-10-03 DIAGNOSIS — E039 Hypothyroidism, unspecified: Secondary | ICD-10-CM | POA: Diagnosis not present

## 2017-10-03 DIAGNOSIS — E785 Hyperlipidemia, unspecified: Secondary | ICD-10-CM | POA: Diagnosis not present

## 2017-10-03 DIAGNOSIS — N189 Chronic kidney disease, unspecified: Secondary | ICD-10-CM

## 2017-10-03 DIAGNOSIS — Z96642 Presence of left artificial hip joint: Secondary | ICD-10-CM | POA: Diagnosis not present

## 2017-10-03 HISTORY — DX: Prediabetes: R73.03

## 2017-10-03 HISTORY — DX: Headache, unspecified: R51.9

## 2017-10-03 HISTORY — DX: Headache: R51

## 2017-10-03 HISTORY — DX: Other specified postprocedural states: Z98.890

## 2017-10-03 HISTORY — PX: AV FISTULA PLACEMENT: SHX1204

## 2017-10-03 HISTORY — DX: Nausea with vomiting, unspecified: R11.2

## 2017-10-03 LAB — POCT I-STAT 4, (NA,K, GLUC, HGB,HCT)
GLUCOSE: 88 mg/dL (ref 70–99)
HCT: 43 % (ref 36.0–46.0)
Hemoglobin: 14.6 g/dL (ref 12.0–15.0)
Potassium: 3.7 mmol/L (ref 3.5–5.1)
Sodium: 133 mmol/L — ABNORMAL LOW (ref 135–145)

## 2017-10-03 LAB — GLUCOSE, CAPILLARY: Glucose-Capillary: 87 mg/dL (ref 70–99)

## 2017-10-03 SURGERY — INSERTION OF ARTERIOVENOUS (AV) GORE-TEX GRAFT ARM
Anesthesia: Monitor Anesthesia Care | Laterality: Left

## 2017-10-03 MED ORDER — LIDOCAINE HCL (PF) 1 % IJ SOLN
INTRAMUSCULAR | Status: AC
  Filled 2017-10-03: qty 30

## 2017-10-03 MED ORDER — PROPOFOL 500 MG/50ML IV EMUL
INTRAVENOUS | Status: DC | PRN
  Administered 2017-10-03: 50 ug/kg/min via INTRAVENOUS
  Administered 2017-10-03: 100 ug/kg/min via INTRAVENOUS

## 2017-10-03 MED ORDER — FENTANYL CITRATE (PF) 250 MCG/5ML IJ SOLN
INTRAMUSCULAR | Status: AC
  Filled 2017-10-03: qty 5

## 2017-10-03 MED ORDER — FENTANYL CITRATE (PF) 100 MCG/2ML IJ SOLN
INTRAMUSCULAR | Status: DC | PRN
  Administered 2017-10-03 (×3): 50 ug via INTRAVENOUS

## 2017-10-03 MED ORDER — SODIUM CHLORIDE 0.9 % IV SOLN
INTRAVENOUS | Status: AC
  Filled 2017-10-03: qty 1.2

## 2017-10-03 MED ORDER — 0.9 % SODIUM CHLORIDE (POUR BTL) OPTIME
TOPICAL | Status: DC | PRN
  Administered 2017-10-03: 1000 mL

## 2017-10-03 MED ORDER — GLYCOPYRROLATE 0.2 MG/ML IJ SOLN
INTRAMUSCULAR | Status: DC | PRN
  Administered 2017-10-03: 0.2 mg via INTRAVENOUS

## 2017-10-03 MED ORDER — SODIUM CHLORIDE 0.9 % IV SOLN
INTRAVENOUS | Status: DC
  Administered 2017-10-03: 09:00:00 via INTRAVENOUS

## 2017-10-03 MED ORDER — PROPOFOL 10 MG/ML IV BOLUS: INTRAVENOUS | Status: DC | PRN

## 2017-10-03 MED ORDER — HEPARIN SODIUM (PORCINE) 1000 UNIT/ML IJ SOLN
INTRAMUSCULAR | Status: AC
  Filled 2017-10-03: qty 1

## 2017-10-03 MED ORDER — LIDOCAINE HCL (PF) 1 % IJ SOLN
INTRAMUSCULAR | Status: DC | PRN
  Administered 2017-10-03: 30 mL

## 2017-10-03 MED ORDER — PHENYLEPHRINE HCL 10 MG/ML IJ SOLN
INTRAMUSCULAR | Status: DC | PRN
  Administered 2017-10-03 (×7): 80 ug via INTRAVENOUS

## 2017-10-03 MED ORDER — MIDAZOLAM HCL 2 MG/2ML IJ SOLN
INTRAMUSCULAR | Status: AC
  Filled 2017-10-03: qty 2

## 2017-10-03 MED ORDER — EPHEDRINE SULFATE 50 MG/ML IJ SOLN
INTRAMUSCULAR | Status: DC | PRN
  Administered 2017-10-03: 10 mg via INTRAVENOUS

## 2017-10-03 MED ORDER — HEPARIN SODIUM (PORCINE) 1000 UNIT/ML IJ SOLN
INTRAMUSCULAR | Status: DC | PRN
  Administered 2017-10-03: 5000 [IU] via INTRAVENOUS

## 2017-10-03 MED ORDER — SODIUM CHLORIDE 0.9 % IV SOLN
INTRAVENOUS | Status: DC | PRN
  Administered 2017-10-03: 11:00:00

## 2017-10-03 MED ORDER — PROPOFOL 10 MG/ML IV BOLUS
INTRAVENOUS | Status: AC
  Filled 2017-10-03: qty 20

## 2017-10-03 MED ORDER — PROTAMINE SULFATE 10 MG/ML IV SOLN
INTRAVENOUS | Status: DC | PRN
  Administered 2017-10-03: 50 mg via INTRAVENOUS

## 2017-10-03 MED ORDER — PROTAMINE SULFATE 10 MG/ML IV SOLN
INTRAVENOUS | Status: AC
  Filled 2017-10-03: qty 5

## 2017-10-03 MED ORDER — MIDAZOLAM HCL 5 MG/5ML IJ SOLN
INTRAMUSCULAR | Status: DC | PRN
  Administered 2017-10-03 (×2): 1 mg via INTRAVENOUS

## 2017-10-03 SURGICAL SUPPLY — 31 items
ARMBAND PINK RESTRICT EXTREMIT (MISCELLANEOUS) ×2 IMPLANT
CANISTER SUCT 3000ML PPV (MISCELLANEOUS) ×2 IMPLANT
CANNULA VESSEL 3MM 2 BLNT TIP (CANNULA) ×2 IMPLANT
CLIP VESOCCLUDE MED 6/CT (CLIP) ×2 IMPLANT
CLIP VESOCCLUDE SM WIDE 6/CT (CLIP) ×2 IMPLANT
DECANTER SPIKE VIAL GLASS SM (MISCELLANEOUS) IMPLANT
DERMABOND ADVANCED (GAUZE/BANDAGES/DRESSINGS) ×1
DERMABOND ADVANCED .7 DNX12 (GAUZE/BANDAGES/DRESSINGS) ×1 IMPLANT
DRAIN PENROSE 1/4X12 LTX STRL (WOUND CARE) ×2 IMPLANT
ELECT REM PT RETURN 9FT ADLT (ELECTROSURGICAL) ×2
ELECTRODE REM PT RTRN 9FT ADLT (ELECTROSURGICAL) ×1 IMPLANT
GLOVE BIO SURGEON STRL SZ 6.5 (GLOVE) ×6 IMPLANT
GLOVE BIO SURGEON STRL SZ7.5 (GLOVE) ×2 IMPLANT
GOWN STRL REUS W/ TWL LRG LVL3 (GOWN DISPOSABLE) ×4 IMPLANT
GOWN STRL REUS W/TWL LRG LVL3 (GOWN DISPOSABLE) ×4
GRAFT GORETEX STRT 4-7X45 (Vascular Products) ×2 IMPLANT
HEMOSTAT SPONGE AVITENE ULTRA (HEMOSTASIS) IMPLANT
KIT BASIN OR (CUSTOM PROCEDURE TRAY) ×2 IMPLANT
KIT TURNOVER KIT B (KITS) ×2 IMPLANT
NS IRRIG 1000ML POUR BTL (IV SOLUTION) ×2 IMPLANT
PACK CV ACCESS (CUSTOM PROCEDURE TRAY) ×2 IMPLANT
PAD ARMBOARD 7.5X6 YLW CONV (MISCELLANEOUS) ×4 IMPLANT
SUT PROLENE 6 0 CC (SUTURE) ×12 IMPLANT
SUT VIC AB 3-0 SH 27 (SUTURE) ×3
SUT VIC AB 3-0 SH 27X BRD (SUTURE) ×3 IMPLANT
SUT VIC AB 4-0 PS2 18 (SUTURE) ×2 IMPLANT
SUT VICRYL 4-0 PS2 18IN ABS (SUTURE) ×4 IMPLANT
SYR TOOMEY 50ML (SYRINGE) IMPLANT
TOWEL GREEN STERILE (TOWEL DISPOSABLE) ×2 IMPLANT
UNDERPAD 30X30 (UNDERPADS AND DIAPERS) ×2 IMPLANT
WATER STERILE IRR 1000ML POUR (IV SOLUTION) ×2 IMPLANT

## 2017-10-03 NOTE — Interval H&P Note (Signed)
History and Physical Interval Note:  10/03/2017 10:21 AM  Crystal Clarke  has presented today for surgery, with the diagnosis of end stage renal disease  The various methods of treatment have been discussed with the patient and family. After consideration of risks, benefits and other options for treatment, the patient has consented to  Procedure(s): INSERTION OF ARTERIOVENOUS (AV) GORE-TEX GRAFT ARM (Left) as a surgical intervention .  The patient's history has been reviewed, patient examined, no change in status, stable for surgery.  I have reviewed the patient's chart and labs.  Questions were answered to the patient's satisfaction.     Ruta Hinds

## 2017-10-03 NOTE — Discharge Instructions (Signed)
° °  [facility]  Discharge Instructions  AV Fistula or Graft Surgery for Dialysis Access  Please refer to the following instructions for your post-procedure care. Your surgeon or physician assistant will discuss any changes with you.  Activity  You may drive the day following your surgery, if you are comfortable and no longer taking prescription pain medication. Resume full activity as the soreness in your incision resolves.  Bathing/Showering  You may shower after you go home. Keep your incision dry for 48 hours. Do not soak in a bathtub, hot tub, or swim until the incision heals completely. You may not shower if you have a hemodialysis catheter.  Incision Care  Clean your incision with mild soap and water after 48 hours. Pat the area dry with a clean towel. You do not need a bandage unless otherwise instructed. Do not apply any ointments or creams to your incision. You may have skin glue on your incision. Do not peel it off. It will come off on its own in about one week. Your arm may swell a bit after surgery. To reduce swelling use pillows to elevate your arm so it is above your heart. Your doctor will tell you if you need to lightly wrap your arm with an ACE bandage.  Diet  Resume your normal diet. There are not special food restrictions following this procedure. In order to heal from your surgery, it is CRITICAL to get adequate nutrition. Your body requires vitamins, minerals, and protein. Vegetables are the best source of vitamins and minerals. Vegetables also provide the perfect balance of protein. Processed food has little nutritional value, so try to avoid this.  Medications  Resume taking all of your medications. If your incision is causing pain, you may take over-the counter pain relievers such as acetaminophen (Tylenol). If you were prescribed a stronger pain medication, please be aware these medications can cause nausea and constipation. Prevent  nausea by taking the medication with a snack or meal. Avoid constipation by drinking plenty of fluids and eating foods with high amount of fiber, such as fruits, vegetables, and grains.  Do not take Tylenol if you are taking prescription pain medications.  Follow up Your surgeon may want to see you in the office following your access surgery. If so, this will be arranged at the time of your surgery.  Please call us immediately for any of the following conditions:  Increased pain, redness, drainage (pus) from your incision site Fever of 101 degrees or higher Severe or worsening pain at your incision site Hand pain or numbness.  Reduce your risk of vascular disease:  Stop smoking. If you would like help, call [facility] at 1-800-QUIT-NOW (780)137-5982) or [place] at [place] your cholesterol Maintain a desired weight Control your diabetes Keep your blood pressure down  Dialysis  It will take several weeks to several months for your new dialysis access to be ready for use. Your surgeon will determine when it is okay to use it. Your nephrologist will continue to direct your dialysis. You can continue to use your Permcath until your new access is ready for use.   10/03/2017 Crystal Clarke [id number] 1960/05/20  Surgeon(s): Fields, Jessy Oto, MD  Procedure(s): INSERTION OF ARTERIOVENOUS (AV) GORE-TEX STRETCH 4-7MM GRAFT ARM  x Do not stick graft for 4 weeks    If you have any questions, please call the office at [phone].

## 2017-10-03 NOTE — Anesthesia Postprocedure Evaluation (Signed)
Anesthesia Post Note  Patient: Crystal Clarke  Procedure(s) Performed: INSERTION OF ARTERIOVENOUS (AV) GORE-TEX STRETCH 4-7MM GRAFT ARM (Left )     Patient location during evaluation: PACU Anesthesia Type: MAC Level of consciousness: awake and alert Pain management: pain level controlled Vital Signs Assessment: post-procedure vital signs reviewed and stable Respiratory status: spontaneous breathing, nonlabored ventilation, respiratory function stable and patient connected to nasal cannula oxygen Cardiovascular status: stable and blood pressure returned to baseline Postop Assessment: no apparent nausea or vomiting Anesthetic complications: no    Last Vitals:  Vitals:   10/03/17 1350 10/03/17 1405  BP: 91/61 (!) 90/56  Pulse: 88 91  Resp: 20 12  Temp:  (!) 36.4 C  SpO2: 96% 92%    Last Pain:  Vitals:   10/03/17 1405  TempSrc:   PainSc: 0-No pain                 Montez Hageman

## 2017-10-03 NOTE — Op Note (Signed)
Procedure: Left Upper Arm AV graft  Preop: ESRD  Postop: ESRD  Anesthesia: Local with sedation  Findings:4-7 mm PTFE end to side to axillary vein  VERY small artery and vein  Asst: Leontine Locket PA-C   Procedure Details: After adequate sedation, the left upper extremity was prepped and draped in usual sterile fashion. Local anesthesia was infiltrated in the antecubital crease and axilla.   A longitudinal incision was then made near the antecubital crease the left arm.  There were no suitable antecubital veins for outflow.   The incision was carried into the subcutaneous tissues down to level of the brachial artery.  This was taken down and the vein ligated and divided adjacent to the prior anastomosis.  Next the brachial artery was dissected free in the medial portion incision. The artery was  2.5 mm in diameter. The vessel loops were placed proximal and distal to the planned site of arteriotomy. At this point, a longitudinal incision was made in the axilla and carried through the subcutaneous tissues and fascia to expose the axillary vein.  The nerves were protected.  The vein was approximately 3 mm in diameter. Next, a subcutaneous tunnel was created connecting the upper arm to the lower arm incision in an arcing configuration over the biceps muscle.  A 4-7 mm PTFE graft was then brought through this subcutaneous tunnel. The patient was given 5000 units of intravenous heparin. After appropriate circulation time, the vessel loops were used to control the artery. A longitudinal opening was made in the left brachial artery.  The 4 mm end of the graft was beveled and sewn end to side to the artery using a 6 0 prolene.  At completion of the anastomosis the artery was forward bled, backbled and thoroughly flushed.  The anastomosis was secured, vessel loops were released and there was palpable pulse in the graft.  The graft was clamped just above the arterial anastomosis with a fistula clamp. The graft  was then pulled taut to length at the axillary incision.  The axillary vein was controlled with a fine bulldog clamp in the upper axilla proximally and distally.  The vein was opened longitudinally.  The distal end of the graft was then beveled and sewn end to side to the vein using a running 6 0 prolene.  Just prior to completion of the anastomosis, everything was forward bled, back bled and thoroughly flushed.  The anastomosis was secured and the fistula clamp removed from the proximal graft.  A thrill was immediately palpable in the graft.  After hemostasis was obtained, the subcutaneous tissues were reapproximated using a running 3-0 Vicryl suture. The skin was then closed with a 4 0 Vicryl subcuticular stitch. Dermabond was applied to the skin incisions.  The patient tolerated the procedure well and there were no complications.  Instrument sponge and needle count was correct at the end of the case.  The patient was taken to the recovery room in stable condition.   Ruta Hinds, MD [facility] [place] Office: [phone] Pager: [phone]

## 2017-10-03 NOTE — Anesthesia Preprocedure Evaluation (Addendum)
Anesthesia Evaluation  Patient identified by MRN, date of birth, ID band Patient awake    Reviewed: Allergy & Precautions, NPO status , Patient's Chart, lab work & pertinent test results  History of Anesthesia Complications (+) PONV  Airway Mallampati: II  TM Distance: >3 FB Neck ROM: Full    Dental no notable dental hx.    Pulmonary neg pulmonary ROS,    Pulmonary exam normal breath sounds clear to auscultation       Cardiovascular hypertension, + Past MI and + CABG  Normal cardiovascular exam Rhythm:Regular Rate:Normal     Neuro/Psych CVA negative psych ROS   GI/Hepatic negative GI ROS, Neg liver ROS,   Endo/Other  negative endocrine ROS  Renal/GU ESRF and DialysisRenal disease  negative genitourinary   Musculoskeletal negative musculoskeletal ROS (+) Lupus   Abdominal   Peds negative pediatric ROS (+)  Hematology negative hematology ROS (+)   Anesthesia Other Findings   Reproductive/Obstetrics negative OB ROS                            Anesthesia Physical Anesthesia Plan  ASA: III  Anesthesia Plan: MAC   Post-op Pain Management:    Induction:   PONV Risk Score and Plan: 3 and Ondansetron and Treatment may vary due to age or medical condition  Airway Management Planned: Nasal Cannula and Simple Face Mask  Additional Equipment:   Intra-op Plan:   Post-operative Plan:   Informed Consent: I have reviewed the patients History and Physical, chart, labs and discussed the procedure including the risks, benefits and alternatives for the proposed anesthesia with the patient or authorized representative who has indicated his/her understanding and acceptance.   Dental advisory given  Plan Discussed with: CRNA  Anesthesia Plan Comments:         Anesthesia Quick Evaluation

## 2017-10-03 NOTE — Transfer of Care (Signed)
Immediate Anesthesia Transfer of Care Note  Patient: Crystal Clarke  Procedure(s) Performed: INSERTION OF ARTERIOVENOUS (AV) GORE-TEX STRETCH 4-7MM GRAFT ARM (Left )  Patient Location: PACU  Anesthesia Type:MAC  Level of Consciousness: awake and alert   Airway & Oxygen Therapy: Patient Spontanous Breathing and Patient connected to nasal cannula oxygen  Post-op Assessment: Report given to RN, Post -op Vital signs reviewed and stable and Patient moving all extremities  Post vital signs: Reviewed and stable  Last Vitals:  Vitals Value Taken Time  BP 91/62 10/03/2017  1:35 PM  Temp 36.6 C 10/03/2017  1:35 PM  Pulse 88 10/03/2017  1:39 PM  Resp 14 10/03/2017  1:39 PM  SpO2 95 % 10/03/2017  1:39 PM  Vitals shown include unvalidated device data.  Last Pain:  Vitals:   10/03/17 1335  TempSrc:   PainSc: (P) 0-No pain      Patients Stated Pain Goal: 3 (03/88/82 8003)  Complications: No apparent anesthesia complications

## 2017-10-03 NOTE — Telephone Encounter (Signed)
sch appt lvm 10/25/17 1pm p/o PA

## 2017-10-04 ENCOUNTER — Encounter (HOSPITAL_COMMUNITY): Payer: Self-pay | Admitting: Vascular Surgery

## 2017-10-05 ENCOUNTER — Other Ambulatory Visit: Payer: Self-pay

## 2017-10-06 NOTE — Patient Outreach (Signed)
Telephone outreach:  12:12 pm Telephone call to patient who reports she is in dialysis to call her back after 4pm.   4:30pm  Return call to patient. Patient reports she is out of dialysis and feels weak and has pain in her arm. Reports a new graft.   Denies any other problems. Denies any recent falls. Overall feels things are going well.  Reviewed case closure and patient is in agreement to case closure and will call me for other needs in the future.  PLAN: case closed. Goals met. Will send letter to patient and MD.  Tomasa Rand, RN, BSN, CEN [place] Coordinator [phone]

## 2017-10-11 ENCOUNTER — Other Ambulatory Visit (HOSPITAL_COMMUNITY): Payer: Self-pay | Admitting: Nephrology

## 2017-10-11 DIAGNOSIS — M79642 Pain in left hand: Secondary | ICD-10-CM

## 2017-10-12 ENCOUNTER — Ambulatory Visit (HOSPITAL_COMMUNITY)
Admission: RE | Admit: 2017-10-12 | Discharge: 2017-10-12 | Disposition: A | Discharge status: Home / Self Care | Payer: PPO | Source: Ambulatory Visit | Attending: Vascular Surgery | Admitting: Vascular Surgery

## 2017-10-12 ENCOUNTER — Encounter: Payer: Self-pay | Admitting: Physician Assistant

## 2017-10-12 ENCOUNTER — Ambulatory Visit (INDEPENDENT_AMBULATORY_CARE_PROVIDER_SITE_OTHER): Payer: Self-pay | Admitting: Physician Assistant

## 2017-10-12 ENCOUNTER — Other Ambulatory Visit: Payer: Self-pay

## 2017-10-12 DIAGNOSIS — M79642 Pain in left hand: Secondary | ICD-10-CM | POA: Diagnosis not present

## 2017-10-12 DIAGNOSIS — R200 Anesthesia of skin: Secondary | ICD-10-CM | POA: Insufficient documentation

## 2017-10-12 DIAGNOSIS — Z992 Dependence on renal dialysis: Secondary | ICD-10-CM

## 2017-10-12 DIAGNOSIS — N186 End stage renal disease: Secondary | ICD-10-CM

## 2017-10-12 NOTE — Progress Notes (Signed)
    Postoperative Access Visit   History of Present Illness   Crystal Clarke is a 57 y.o. year old female who presents for postoperative follow-up for: left upper arm arteriovenous graft by Dr. Oneida Alar on 10/03/17.  Pre-op vein mapping noted no adequate veins for fistula creation.  She has not had prior access surgeries.  She is dialyzing via R IJ TDC.  She returns to office complaining of L hand pain that comes and goes, numbness in index finger tip, and swelling at [facility] fossa incision since surgery.   The patient's wounds are healing well without drainage.  She has not been making an effort to exercise her left hand and keep hand below her heart.  The patient is able to complete their activities of daily living including ambulating with walker and cleansing.  Physical Examination   Vitals:   10/12/17 1550  BP: 110/76  Pulse: 79  Resp: 14  Temp: (!) 97.4 F (36.3 C)  TempSrc: Oral  SpO2: 100%  Weight: 128 lb (58.1 kg)  Height: 5\' 5"  (1.651 m)   Body mass index is 21.3 kg/m.  left arm Incision L axilla and AC fossa c/d/i; some local edema Ac fossa incision however no erythema or other sign of infection, hand grip is 5/5, sensation in digits is intact except for L index and thumb tip, palpable thrill, bruit can be auscultated; 1+ L radial pulse; palmar arch signal by doppler    Steal duplex: L radial artery PSV 129 w compression and 39 w/o compression   Medical Decision Making   Crystal Clarke is a 57 y.o. year old female who presents s/p left upper arm arteriovenous graft   L arm AVG is patent with a palpable thrill  Patient likely stealing some however she is adequately perfusing her hand with doppler signal of palmar arch and 1+ palpable L radial pulse  I encouraged her to exercise her hand and keep her LUE in a dependent position and to follow up with Dr. Oneida Alar as scheduled 10/23/17  If symptoms worsen or fail to improve she will call/return to office sooner  Patient believes she  can tolerate these symptoms and is agreeable to this plan   Dagoberto Ligas PA-C [facility] [place] Office: [phone]

## 2017-10-17 DIAGNOSIS — Z992 Dependence on renal dialysis: Secondary | ICD-10-CM | POA: Diagnosis not present

## 2017-10-17 DIAGNOSIS — N186 End stage renal disease: Secondary | ICD-10-CM | POA: Diagnosis not present

## 2017-10-17 DIAGNOSIS — M3210 Systemic lupus erythematosus, organ or system involvement unspecified: Secondary | ICD-10-CM | POA: Diagnosis not present

## 2017-10-18 DIAGNOSIS — Z23 Encounter for immunization: Secondary | ICD-10-CM | POA: Diagnosis not present

## 2017-10-18 DIAGNOSIS — N186 End stage renal disease: Secondary | ICD-10-CM | POA: Diagnosis not present

## 2017-10-18 DIAGNOSIS — D689 Coagulation defect, unspecified: Secondary | ICD-10-CM | POA: Diagnosis not present

## 2017-10-18 DIAGNOSIS — D631 Anemia in chronic kidney disease: Secondary | ICD-10-CM | POA: Diagnosis not present

## 2017-10-18 DIAGNOSIS — D509 Iron deficiency anemia, unspecified: Secondary | ICD-10-CM | POA: Diagnosis not present

## 2017-10-18 DIAGNOSIS — Z452 Encounter for adjustment and management of vascular access device: Secondary | ICD-10-CM | POA: Diagnosis not present

## 2017-10-19 ENCOUNTER — Encounter: Payer: Self-pay | Admitting: Vascular Surgery

## 2017-10-19 ENCOUNTER — Ambulatory Visit (INDEPENDENT_AMBULATORY_CARE_PROVIDER_SITE_OTHER): Payer: Self-pay | Admitting: Vascular Surgery

## 2017-10-19 ENCOUNTER — Encounter: Payer: Self-pay | Admitting: *Deleted

## 2017-10-19 ENCOUNTER — Other Ambulatory Visit: Payer: Self-pay | Admitting: *Deleted

## 2017-10-19 ENCOUNTER — Encounter (HOSPITAL_COMMUNITY): Payer: Self-pay | Admitting: *Deleted

## 2017-10-19 ENCOUNTER — Other Ambulatory Visit: Payer: Self-pay

## 2017-10-19 VITALS — BP 110/69 | HR 63 | Temp 97.6°F | Resp 14 | Ht 65.0 in | Wt 128.0 lb

## 2017-10-19 DIAGNOSIS — Z992 Dependence on renal dialysis: Secondary | ICD-10-CM

## 2017-10-19 DIAGNOSIS — N186 End stage renal disease: Secondary | ICD-10-CM

## 2017-10-19 NOTE — Progress Notes (Signed)
Spoke with pt for pre-op call. Pt has an extensive medical history with CAD and CABG. Pt denies any recent chest pain or sob. Pt's cardiologist is Dr. Agustin Cree in [place]. Pt has lupus which had caused renal failure. Pt states she is pre-diabetic. Does not know what her last A1C was and does not check her blood sugar at home. Pt is on Plavix, last dose was 10/18/17

## 2017-10-19 NOTE — Progress Notes (Signed)
Patient ID: Crystal Clarke, female   DOB: 1960/04/05, 57 y.o.   MRN: [id number]  Reason for Consult: Follow-up (left hand steal symptoms)   Referred by Maurice Small, MD  Subjective:     HPI:  Crystal Clarke is a 57 y.o. female recently had left upper arm AV graft placed.  She is on dialysis via tunneled catheter.  She is having some pain in her left arm initially now it is gotten so bad she states is 10 out of 10 and constant.  She is able to tolerate it.  She has associated purple discoloration of her fingers that waxes and wanes.  Nothing is really made her pain any better.  She does not have any tissue loss or ulceration.  Past Medical History:  Diagnosis Date  . Anemia   . Anxiety   . Arthritis   . Blood transfusion without reported diagnosis   . CHF (congestive heart failure) ([place])   . Chronic kidney disease   . Depression   . GERD (gastroesophageal reflux disease)   . Headache   . Heart murmur   . Hyperlipidemia   . Hypertension   . Lupus (systemic lupus erythematosus) ([place])   . 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 Myocardial infarction ([place])   . Osteoporosis   . PONV (postoperative nausea and vomiting)   . Pre-diabetes   . Stroke ([place])   . Thyroid disease    Family History  Problem Relation Age of Onset  . Dementia Father    Past Surgical History:  Procedure Laterality Date  . AV FISTULA PLACEMENT Left 10/03/2017   Procedure: INSERTION OF ARTERIOVENOUS (AV) GORE-TEX STRETCH 4-7MM GRAFT ARM;  Surgeon: Elam Dutch, MD;  Location: [place];  Service: Vascular;  Laterality: Left;  . BREAST SURGERY    . COLONOSCOPY    . CORONARY ARTERY BYPASS GRAFT    . JOINT REPLACEMENT     right knee, left knee and left hip  . WISDOM TOOTH EXTRACTION      Short Social History:  Social History   Tobacco Use  . Smoking status: Never Smoker  . Smokeless tobacco: Never Used  Substance Use Topics  . Alcohol use: No    Allergies  Allergen Reactions  . Vancomycin Other  (See Comments)    RED MAN SYNDROME  . Sulfa Antibiotics     UNSPECIFIED REACTION   . Ace Inhibitors Nausea And Vomiting  . Ancef [Cefazolin] Rash    Current Outpatient Medications  Medication Sig Dispense Refill  . ALPRAZolam (XANAX) 0.5 MG tablet Take 0.5 mg by mouth daily as needed for anxiety.     . ARTIFICIAL TEAR OP Place 1 drop into both eyes daily as needed (dry eyes).    Marland Kitchen aspirin EC 81 MG tablet Take 81 mg by mouth daily.    Marland Kitchen atorvastatin (LIPITOR) 40 MG tablet Take 40 mg by mouth at bedtime.     . clopidogrel (PLAVIX) 75 MG tablet Take 1 tablet (75 mg total) by mouth daily. 30 tablet 11  . dexlansoprazole (DEXILANT) 60 MG capsule Take 60 mg by mouth daily.    . diclofenac sodium (VOLTAREN) 1 % GEL Apply 1 application topically 4 (four) times daily as needed (pain).     Marland Kitchen ezetimibe (ZETIA) 10 MG tablet Take 10 mg by mouth every evening.     Marland Kitchen FLUoxetine (PROZAC) 10 MG capsule     . FLUoxetine (PROZAC) 10 MG tablet Take 10 mg by mouth every  evening.    . hydroxychloroquine (PLAQUENIL) 200 MG tablet Take 200 mg by mouth daily.    Marland Kitchen levothyroxine (SYNTHROID, LEVOTHROID) 100 MCG tablet Take 100 mcg by mouth daily before breakfast.    . MOVANTIK 25 MG TABS tablet Take 25 mg by mouth daily as needed for constipation.  2  . Multiple Vitamin (DAILY VITE) TABS Take 1 tablet by mouth every evening.    . nitroGLYCERIN (NITROSTAT) 0.4 MG SL tablet Place 0.4 mg under the tongue every 5 (five) minutes as needed.    Marland Kitchen oxyCODONE 30 MG 12 hr tablet Take 30 mg by mouth 2 (two) times daily.     . Oxycodone HCl 10 MG TABS Take 10 mg by mouth 2 (two) times daily as needed (breakthrough pain).     . QUEtiapine Fumarate (SEROQUEL XR) 150 MG 24 hr tablet Take 150 mg by mouth at bedtime.    . ranitidine (ZANTAC) 150 MG capsule Take 150 mg by mouth at bedtime.    . ranitidine (ZANTAC) 150 MG tablet Take 150 mg by mouth at bedtime.  3  . Sennosides-Docusate Sodium (SENNA PLUS PO) Take 2 tablets by mouth  2 (two) times daily.    Marland Kitchen zolpidem (AMBIEN) 10 MG tablet Take 10 mg by mouth at bedtime as needed for sleep.      No current facility-administered medications for this visit.     Review of Systems  Musculoskeletal:       Left hand pain       Objective:  Objective   Vitals:   10/19/17 1048  BP: 110/69  Pulse: 63  Resp: 14  Temp: 97.6 F (36.4 C)  TempSrc: Oral  SpO2: 97%  Weight: 128 lb (58.1 kg)  Height: 5\' 5"  (1.651 m)   Body mass index is 21.3 kg/m.  Physical Exam  Constitutional: She appears well-developed.  Eyes: Pupils are equal, round, and reactive to light.  Cardiovascular:  I cannot palpate a radial pulse but with compression of the graft she has a 2+ radial pulse in the left  Pulmonary/Chest: Effort normal.  Skin: Skin is warm and dry.  Psychiatric: She has a normal mood and affect. Her behavior is normal. Judgment and thought content normal.         Assessment/Plan:     57 year old female follows up after left arm AV graft.  At this point she is having 10 out of 10 pain in her left hand that is constant.  She has clinical steal.  I have offered her surgery today but she recently has eaten.  She states that she can tolerate the pain until Monday and will get her scheduled for graft excision.  Certainly if her issues get worse she can be seen in the ER and be scheduled emergently.  She demonstrates good understanding we will get her set up today.     Waynetta Sandy MD [facility]

## 2017-10-19 NOTE — H&P (View-Only) (Signed)
Patient ID: Crystal Clarke, female   DOB: 14-Nov-1960, 57 y.o.   MRN: [id number]  Reason for Consult: Follow-up (left hand steal symptoms)   Referred by Maurice Small, MD  Subjective:     HPI:  Crystal Clarke is a 57 y.o. female recently had left upper arm AV graft placed.  She is on dialysis via tunneled catheter.  She is having some pain in her left arm initially now it is gotten so bad she states is 10 out of 10 and constant.  She is able to tolerate it.  She has associated purple discoloration of her fingers that waxes and wanes.  Nothing is really made her pain any better.  She does not have any tissue loss or ulceration.  Past Medical History:  Diagnosis Date  . Anemia   . Anxiety   . Arthritis   . Blood transfusion without reported diagnosis   . CHF (congestive heart failure) ([place])   . Chronic kidney disease   . Depression   . GERD (gastroesophageal reflux disease)   . Headache   . Heart murmur   . Hyperlipidemia   . Hypertension   . Lupus (systemic lupus erythematosus) ([place])   . 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 Myocardial infarction ([place])   . Osteoporosis   . PONV (postoperative nausea and vomiting)   . Pre-diabetes   . Stroke ([place])   . Thyroid disease    Family History  Problem Relation Age of Onset  . Dementia Father    Past Surgical History:  Procedure Laterality Date  . AV FISTULA PLACEMENT Left 10/03/2017   Procedure: INSERTION OF ARTERIOVENOUS (AV) GORE-TEX STRETCH 4-7MM GRAFT ARM;  Surgeon: Elam Dutch, MD;  Location: [place];  Service: Vascular;  Laterality: Left;  . BREAST SURGERY    . COLONOSCOPY    . CORONARY ARTERY BYPASS GRAFT    . JOINT REPLACEMENT     right knee, left knee and left hip  . WISDOM TOOTH EXTRACTION      Short Social History:  Social History   Tobacco Use  . Smoking status: Never Smoker  . Smokeless tobacco: Never Used  Substance Use Topics  . Alcohol use: No    Allergies  Allergen Reactions  . Vancomycin Other  (See Comments)    RED MAN SYNDROME  . Sulfa Antibiotics     UNSPECIFIED REACTION   . Ace Inhibitors Nausea And Vomiting  . Ancef [Cefazolin] Rash    Current Outpatient Medications  Medication Sig Dispense Refill  . ALPRAZolam (XANAX) 0.5 MG tablet Take 0.5 mg by mouth daily as needed for anxiety.     . ARTIFICIAL TEAR OP Place 1 drop into both eyes daily as needed (dry eyes).    Marland Kitchen aspirin EC 81 MG tablet Take 81 mg by mouth daily.    Marland Kitchen atorvastatin (LIPITOR) 40 MG tablet Take 40 mg by mouth at bedtime.     . clopidogrel (PLAVIX) 75 MG tablet Take 1 tablet (75 mg total) by mouth daily. 30 tablet 11  . dexlansoprazole (DEXILANT) 60 MG capsule Take 60 mg by mouth daily.    . diclofenac sodium (VOLTAREN) 1 % GEL Apply 1 application topically 4 (four) times daily as needed (pain).     Marland Kitchen ezetimibe (ZETIA) 10 MG tablet Take 10 mg by mouth every evening.     Marland Kitchen FLUoxetine (PROZAC) 10 MG capsule     . FLUoxetine (PROZAC) 10 MG tablet Take 10 mg by mouth every  evening.    . hydroxychloroquine (PLAQUENIL) 200 MG tablet Take 200 mg by mouth daily.    Marland Kitchen levothyroxine (SYNTHROID, LEVOTHROID) 100 MCG tablet Take 100 mcg by mouth daily before breakfast.    . MOVANTIK 25 MG TABS tablet Take 25 mg by mouth daily as needed for constipation.  2  . Multiple Vitamin (DAILY VITE) TABS Take 1 tablet by mouth every evening.    . nitroGLYCERIN (NITROSTAT) 0.4 MG SL tablet Place 0.4 mg under the tongue every 5 (five) minutes as needed.    Marland Kitchen oxyCODONE 30 MG 12 hr tablet Take 30 mg by mouth 2 (two) times daily.     . Oxycodone HCl 10 MG TABS Take 10 mg by mouth 2 (two) times daily as needed (breakthrough pain).     . QUEtiapine Fumarate (SEROQUEL XR) 150 MG 24 hr tablet Take 150 mg by mouth at bedtime.    . ranitidine (ZANTAC) 150 MG capsule Take 150 mg by mouth at bedtime.    . ranitidine (ZANTAC) 150 MG tablet Take 150 mg by mouth at bedtime.  3  . Sennosides-Docusate Sodium (SENNA PLUS PO) Take 2 tablets by mouth  2 (two) times daily.    Marland Kitchen zolpidem (AMBIEN) 10 MG tablet Take 10 mg by mouth at bedtime as needed for sleep.      No current facility-administered medications for this visit.     Review of Systems  Musculoskeletal:       Left hand pain       Objective:  Objective   Vitals:   10/19/17 1048  BP: 110/69  Pulse: 63  Resp: 14  Temp: 97.6 F (36.4 C)  TempSrc: Oral  SpO2: 97%  Weight: 128 lb (58.1 kg)  Height: 5\' 5"  (1.651 m)   Body mass index is 21.3 kg/m.  Physical Exam  Constitutional: She appears well-developed.  Eyes: Pupils are equal, round, and reactive to light.  Cardiovascular:  I cannot palpate a radial pulse but with compression of the graft she has a 2+ radial pulse in the left  Pulmonary/Chest: Effort normal.  Skin: Skin is warm and dry.  Psychiatric: She has a normal mood and affect. Her behavior is normal. Judgment and thought content normal.         Assessment/Plan:     57 year old female follows up after left arm AV graft.  At this point she is having 10 out of 10 pain in her left hand that is constant.  She has clinical steal.  I have offered her surgery today but she recently has eaten.  She states that she can tolerate the pain until Monday and will get her scheduled for graft excision.  Certainly if her issues get worse she can be seen in the ER and be scheduled emergently.  She demonstrates good understanding we will get her set up today.     Waynetta Sandy MD [facility]

## 2017-10-22 ENCOUNTER — Other Ambulatory Visit: Payer: Self-pay

## 2017-10-22 ENCOUNTER — Ambulatory Visit (HOSPITAL_COMMUNITY): Payer: PPO | Admitting: Anesthesiology

## 2017-10-22 ENCOUNTER — Encounter (HOSPITAL_COMMUNITY): Payer: Self-pay | Admitting: *Deleted

## 2017-10-22 ENCOUNTER — Ambulatory Visit (HOSPITAL_COMMUNITY)
Admission: RE | Admit: 2017-10-22 | Discharge: 2017-10-22 | Disposition: A | Discharge status: Home / Self Care | Payer: PPO | Source: Ambulatory Visit | Attending: Vascular Surgery | Admitting: Vascular Surgery

## 2017-10-22 ENCOUNTER — Encounter (HOSPITAL_COMMUNITY)
Admission: RE | Disposition: A | Discharge status: Home / Self Care | Payer: Self-pay | Source: Ambulatory Visit | Attending: Vascular Surgery

## 2017-10-22 DIAGNOSIS — Z7902 Long term (current) use of antithrombotics/antiplatelets: Secondary | ICD-10-CM | POA: Insufficient documentation

## 2017-10-22 DIAGNOSIS — Z8673 Personal history of transient ischemic attack (TIA), and cerebral infarction without residual deficits: Secondary | ICD-10-CM | POA: Insufficient documentation

## 2017-10-22 DIAGNOSIS — F329 Major depressive disorder, single episode, unspecified: Secondary | ICD-10-CM | POA: Insufficient documentation

## 2017-10-22 DIAGNOSIS — M810 Age-related osteoporosis without current pathological fracture: Secondary | ICD-10-CM | POA: Diagnosis not present

## 2017-10-22 DIAGNOSIS — D649 Anemia, unspecified: Secondary | ICD-10-CM | POA: Diagnosis not present

## 2017-10-22 DIAGNOSIS — E785 Hyperlipidemia, unspecified: Secondary | ICD-10-CM | POA: Diagnosis not present

## 2017-10-22 DIAGNOSIS — I252 Old myocardial infarction: Secondary | ICD-10-CM | POA: Diagnosis not present

## 2017-10-22 DIAGNOSIS — I2510 Atherosclerotic heart disease of native coronary artery without angina pectoris: Secondary | ICD-10-CM | POA: Insufficient documentation

## 2017-10-22 DIAGNOSIS — I509 Heart failure, unspecified: Secondary | ICD-10-CM | POA: Diagnosis not present

## 2017-10-22 DIAGNOSIS — M329 Systemic lupus erythematosus, unspecified: Secondary | ICD-10-CM | POA: Insufficient documentation

## 2017-10-22 DIAGNOSIS — E079 Disorder of thyroid, unspecified: Secondary | ICD-10-CM | POA: Insufficient documentation

## 2017-10-22 DIAGNOSIS — N189 Chronic kidney disease, unspecified: Secondary | ICD-10-CM | POA: Insufficient documentation

## 2017-10-22 DIAGNOSIS — Z882 Allergy status to sulfonamides status: Secondary | ICD-10-CM | POA: Insufficient documentation

## 2017-10-22 DIAGNOSIS — Z951 Presence of aortocoronary bypass graft: Secondary | ICD-10-CM | POA: Diagnosis not present

## 2017-10-22 DIAGNOSIS — R7303 Prediabetes: Secondary | ICD-10-CM | POA: Diagnosis not present

## 2017-10-22 DIAGNOSIS — M3214 Glomerular disease in systemic lupus erythematosus: Secondary | ICD-10-CM | POA: Insufficient documentation

## 2017-10-22 DIAGNOSIS — Y832 Surgical operation with anastomosis, bypass or graft as the cause of abnormal reaction of the patient, or of later complication, without mention of misadventure at the time of the procedure: Secondary | ICD-10-CM | POA: Insufficient documentation

## 2017-10-22 DIAGNOSIS — I130 Hypertensive heart and chronic kidney disease with heart failure and stage 1 through stage 4 chronic kidney disease, or unspecified chronic kidney disease: Secondary | ICD-10-CM | POA: Insufficient documentation

## 2017-10-22 DIAGNOSIS — K219 Gastro-esophageal reflux disease without esophagitis: Secondary | ICD-10-CM | POA: Insufficient documentation

## 2017-10-22 DIAGNOSIS — T82898A Other specified complication of vascular prosthetic devices, implants and grafts, initial encounter: Secondary | ICD-10-CM | POA: Insufficient documentation

## 2017-10-22 DIAGNOSIS — Z7982 Long term (current) use of aspirin: Secondary | ICD-10-CM | POA: Insufficient documentation

## 2017-10-22 DIAGNOSIS — F419 Anxiety disorder, unspecified: Secondary | ICD-10-CM | POA: Diagnosis not present

## 2017-10-22 DIAGNOSIS — M1990 Unspecified osteoarthritis, unspecified site: Secondary | ICD-10-CM | POA: Insufficient documentation

## 2017-10-22 HISTORY — DX: Atherosclerotic heart disease of native coronary artery without angina pectoris: I25.10

## 2017-10-22 HISTORY — DX: Hypothyroidism, unspecified: E03.9

## 2017-10-22 HISTORY — PX: AVGG REMOVAL: SHX5153

## 2017-10-22 HISTORY — DX: Pneumonia, unspecified organism: J18.9

## 2017-10-22 HISTORY — DX: Constipation, unspecified: K59.00

## 2017-10-22 LAB — POCT I-STAT 4, (NA,K, GLUC, HGB,HCT)
Glucose, Bld: 94 mg/dL (ref 70–99)
HCT: 42 % (ref 36.0–46.0)
Hemoglobin: 14.3 g/dL (ref 12.0–15.0)
POTASSIUM: 3.7 mmol/L (ref 3.5–5.1)
Sodium: 138 mmol/L (ref 135–145)

## 2017-10-22 LAB — GLUCOSE, CAPILLARY
GLUCOSE-CAPILLARY: 79 mg/dL (ref 70–99)
Glucose-Capillary: 76 mg/dL (ref 70–99)
Glucose-Capillary: 96 mg/dL (ref 70–99)

## 2017-10-22 SURGERY — REMOVAL OF ARTERIOVENOUS GORETEX GRAFT (AVGG)
Anesthesia: Monitor Anesthesia Care | Laterality: Left

## 2017-10-22 MED ORDER — ONDANSETRON HCL 4 MG/2ML IJ SOLN
INTRAMUSCULAR | Status: DC | PRN
  Administered 2017-10-22: 4 mg via INTRAVENOUS

## 2017-10-22 MED ORDER — PROPOFOL 500 MG/50ML IV EMUL: INTRAVENOUS | Status: DC | PRN

## 2017-10-22 MED ORDER — SODIUM CHLORIDE 0.9 % IV SOLN
INTRAVENOUS | Status: DC | PRN
  Administered 2017-10-22: 500 mL

## 2017-10-22 MED ORDER — CIPROFLOXACIN IN D5W 400 MG/200ML IV SOLN
INTRAVENOUS | Status: AC
  Filled 2017-10-22: qty 200

## 2017-10-22 MED ORDER — ONDANSETRON HCL 4 MG/2ML IJ SOLN
INTRAMUSCULAR | Status: AC
  Filled 2017-10-22: qty 2

## 2017-10-22 MED ORDER — OXYCODONE-ACETAMINOPHEN 5-325 MG PO TABS: 1.0000 | ORAL_TABLET | Freq: Four times a day (QID) | ORAL | 0 refills | Status: DC | PRN

## 2017-10-22 MED ORDER — SODIUM CHLORIDE 0.9 % IV SOLN
INTRAVENOUS | Status: DC
  Administered 2017-10-22: 11:00:00 via INTRAVENOUS

## 2017-10-22 MED ORDER — LIDOCAINE HCL (PF) 1 % IJ SOLN
INTRAMUSCULAR | Status: DC | PRN
  Administered 2017-10-22: 19 mL

## 2017-10-22 MED ORDER — MIDAZOLAM HCL 2 MG/2ML IJ SOLN
INTRAMUSCULAR | Status: AC
  Filled 2017-10-22: qty 2

## 2017-10-22 MED ORDER — FENTANYL CITRATE (PF) 100 MCG/2ML IJ SOLN: 25.0000 ug | INTRAMUSCULAR | Status: DC | PRN

## 2017-10-22 MED ORDER — CIPROFLOXACIN IN D5W 400 MG/200ML IV SOLN
INTRAVENOUS | Status: DC | PRN
  Administered 2017-10-22: 400 mg via INTRAVENOUS

## 2017-10-22 MED ORDER — SODIUM CHLORIDE 0.9 % IV SOLN
INTRAVENOUS | Status: DC | PRN
  Administered 2017-10-22: 20 ug/min via INTRAVENOUS

## 2017-10-22 MED ORDER — LIDOCAINE HCL (PF) 1 % IJ SOLN
INTRAMUSCULAR | Status: AC
  Filled 2017-10-22: qty 30

## 2017-10-22 MED ORDER — PROPOFOL 10 MG/ML IV BOLUS
INTRAVENOUS | Status: DC | PRN
  Administered 2017-10-22 (×2): 15 mg via INTRAVENOUS
  Administered 2017-10-22: 10 mg via INTRAVENOUS

## 2017-10-22 MED ORDER — PROPOFOL 500 MG/50ML IV EMUL
INTRAVENOUS | Status: DC | PRN
  Administered 2017-10-22: 50 ug/kg/min via INTRAVENOUS

## 2017-10-22 MED ORDER — FENTANYL CITRATE (PF) 100 MCG/2ML IJ SOLN
INTRAMUSCULAR | Status: DC | PRN
  Administered 2017-10-22 (×2): 50 ug via INTRAVENOUS

## 2017-10-22 MED ORDER — FENTANYL CITRATE (PF) 250 MCG/5ML IJ SOLN
INTRAMUSCULAR | Status: AC
  Filled 2017-10-22: qty 5

## 2017-10-22 MED ORDER — 0.9 % SODIUM CHLORIDE (POUR BTL) OPTIME
TOPICAL | Status: DC | PRN
  Administered 2017-10-22: 1000 mL

## 2017-10-22 MED ORDER — LIDOCAINE 2% (20 MG/ML) 5 ML SYRINGE
INTRAMUSCULAR | Status: AC
  Filled 2017-10-22: qty 5

## 2017-10-22 MED ORDER — MIDAZOLAM HCL 5 MG/5ML IJ SOLN
INTRAMUSCULAR | Status: DC | PRN
  Administered 2017-10-22: 2 mg via INTRAVENOUS

## 2017-10-22 SURGICAL SUPPLY — 34 items
BANDAGE ACE 4X5 VEL STRL LF (GAUZE/BANDAGES/DRESSINGS) ×2 IMPLANT
CANISTER SUCT 3000ML PPV (MISCELLANEOUS) ×2 IMPLANT
CANNULA VESSEL 3MM 2 BLNT TIP (CANNULA) IMPLANT
CLIP VESOCCLUDE MED 6/CT (CLIP) ×2 IMPLANT
CLIP VESOCCLUDE SM WIDE 6/CT (CLIP) ×2 IMPLANT
DECANTER SPIKE VIAL GLASS SM (MISCELLANEOUS) IMPLANT
DERMABOND ADVANCED (GAUZE/BANDAGES/DRESSINGS) ×1
DERMABOND ADVANCED .7 DNX12 (GAUZE/BANDAGES/DRESSINGS) ×1 IMPLANT
ELECT REM PT RETURN 9FT ADLT (ELECTROSURGICAL) ×2
ELECTRODE REM PT RTRN 9FT ADLT (ELECTROSURGICAL) ×1 IMPLANT
GLOVE BIO SURGEON STRL SZ7.5 (GLOVE) ×2 IMPLANT
GLOVE BIOGEL PI IND STRL 8 (GLOVE) ×1 IMPLANT
GLOVE BIOGEL PI INDICATOR 8 (GLOVE) ×1
GLOVE ECLIPSE 8.0 STRL XLNG CF (GLOVE) ×2 IMPLANT
GOWN STRL REUS W/ TWL LRG LVL3 (GOWN DISPOSABLE) ×3 IMPLANT
GOWN STRL REUS W/TWL 2XL LVL3 (GOWN DISPOSABLE) ×2 IMPLANT
GOWN STRL REUS W/TWL LRG LVL3 (GOWN DISPOSABLE) ×3
IV NS IRRIG 3000ML ARTHROMATIC (IV SOLUTION) IMPLANT
KIT BASIN OR (CUSTOM PROCEDURE TRAY) ×2 IMPLANT
KIT TURNOVER KIT B (KITS) ×2 IMPLANT
NEEDLE HYPO 25GX1X1/2 BEV (NEEDLE) ×2 IMPLANT
NS IRRIG 1000ML POUR BTL (IV SOLUTION) ×2 IMPLANT
PACK CV ACCESS (CUSTOM PROCEDURE TRAY) ×2 IMPLANT
PAD ARMBOARD 7.5X6 YLW CONV (MISCELLANEOUS) ×4 IMPLANT
SPONGE SURGIFOAM ABS GEL 100 (HEMOSTASIS) IMPLANT
SUT PROLENE 5 0 C 1 24 (SUTURE) ×2 IMPLANT
SUT PROLENE 6 0 CC (SUTURE) ×2 IMPLANT
SUT VIC AB 3-0 SH 27 (SUTURE) ×1
SUT VIC AB 3-0 SH 27X BRD (SUTURE) ×1 IMPLANT
SUT VICRYL 4-0 PS2 18IN ABS (SUTURE) ×2 IMPLANT
TOWEL GREEN STERILE (TOWEL DISPOSABLE) ×2 IMPLANT
TUBING CYSTO DISP (UROLOGICAL SUPPLIES) IMPLANT
UNDERPAD 30X30 (UNDERPADS AND DIAPERS) ×2 IMPLANT
WATER STERILE IRR 1000ML POUR (IV SOLUTION) ×2 IMPLANT

## 2017-10-22 NOTE — Anesthesia Postprocedure Evaluation (Signed)
Anesthesia Post Note  Patient: Crystal Clarke  Procedure(s) Performed: REMOVAL OF ARTERIOVENOUS GORETEX GRAFT ([place]) ARM (Left )     Patient location during evaluation: PACU Anesthesia Type: MAC Level of consciousness: awake Pain management: pain level controlled Vital Signs Assessment: post-procedure vital signs reviewed and stable Respiratory status: spontaneous breathing Cardiovascular status: stable Postop Assessment: no headache Anesthetic complications: no    Last Vitals:  Vitals:   10/22/17 1605 10/22/17 1617  BP: 104/63 127/72  Pulse: 65 65  Resp: 12 14  Temp: 36.5 C   SpO2: 94% 94%    Last Pain:  Vitals:   10/22/17 1605  PainSc: 0-No pain                 Quashon Jesus

## 2017-10-22 NOTE — Op Note (Signed)
Procedure: Removal left upper arm AV graft  Preoperative diagnosis: Ischemic steal left hand  Postoperative diagnosis: Same  Anesthesia: Local with IV sedation  Assistant: Luanne Bras, RNFA  Operative findings: Cuff of PTFE left on arterial and venous side.  Central 90% of graft removed  Operative details: After obtaining informed consent, the patient was taken the operating.  The patient was placed in supine position operating table.  After adequate sedation patient's entire left upper extremities prepped and draped in usual sterile fashion.  Local anesthesia was then treated of her pre-existing scar near the antecubital crease and in the axilla.  Incision was made through the pre-existing scar near the antecubital crease carried down through the subtendinous tissues down the level to graft.  The graft was patent.  It was dissected free circumferentially.  There was dense scar tissue down near the arterial anastomosis.  So I dissected the graft out all the way down to but did not disturb the brachial artery itself.  The graft was clamped just above the level anastomosis and ligated with 220 silks and a 5-0 Prolene suture ligature.  The graft was then transected.  Attention was then turned to the axillary incision.  This was reopened carried down through subtenons tissues down level graft.  The graft was dissected free circumferentially.  The graft was then pulled through the tunneled segment.  There is some oozing that was controlled with hemostasis.  Dissection was carried down the level of the venous anastomosis.  The graft was ligated just above this.  It was then transected and removed.  Hemostasis was obtained in both incisions.  Subcutaneous tissues of both incisions were reapproximated using a running 3-0 Vicryl suture.  The skin of both incisions was closed with a 4-0 Vicryl subcuticular stitch.  The patient had a 2+ palpable radial pulse at the end of the case.  Patient was taken to the  recovery room in stable condition.  Instrument sponge needle count was correct at the end of the case.  Ruta Hinds, MD [facility] [place] Office: [phone] Pager: [phone]

## 2017-10-22 NOTE — Anesthesia Procedure Notes (Signed)
Procedure Name: MAC Date/Time: 10/22/2017 2:25 PM Performed by: White, Amedeo Plenty, CRNA Pre-anesthesia Checklist: Patient identified, Emergency Drugs available, Suction available and Patient being monitored Patient Re-evaluated:Patient Re-evaluated prior to induction Oxygen Delivery Method: Nasal cannula

## 2017-10-22 NOTE — Interval H&P Note (Signed)
History and Physical Interval Note:  10/22/2017 1:56 PM  Crystal Clarke  has presented today for surgery, with the diagnosis of steal syndrome left hand  The various methods of treatment have been discussed with the patient and family. After consideration of risks, benefits and other options for treatment, the patient has consented to  Procedure(s): REMOVAL OF ARTERIOVENOUS GORETEX GRAFT ([place]) ARM (Left) as a surgical intervention .  The patient's history has been reviewed, patient examined, no change in status, stable for surgery.  I have reviewed the patient's chart and labs.  Questions were answered to the patient's satisfaction.     Ruta Hinds

## 2017-10-22 NOTE — Anesthesia Preprocedure Evaluation (Addendum)
Anesthesia Evaluation  Patient identified by MRN, date of birth, ID band Patient awake    History of Anesthesia Complications (+) PONV  Airway Mallampati: II  TM Distance: >3 FB     Dental   Pulmonary pneumonia,    breath sounds clear to auscultation       Cardiovascular hypertension, + CAD, + Past MI and +CHF   Rhythm:Regular Rate:Normal     Neuro/Psych  Headaches,    GI/Hepatic Neg liver ROS, GERD  ,  Endo/Other  Hypothyroidism   Renal/GU Renal disease     Musculoskeletal  (+) Arthritis ,   Abdominal   Peds  Hematology  (+) anemia ,   Anesthesia Other Findings   Reproductive/Obstetrics                            Anesthesia Physical Anesthesia Plan  ASA: III  Anesthesia Plan: MAC   Post-op Pain Management:    Induction: Intravenous  PONV Risk Score and Plan: Treatment may vary due to age or medical condition, Ondansetron, Dexamethasone and Midazolam  Airway Management Planned: Nasal Cannula and Simple Face Mask  Additional Equipment:   Intra-op Plan:   Post-operative Plan:   Informed Consent: I have reviewed the patients History and Physical, chart, labs and discussed the procedure including the risks, benefits and alternatives for the proposed anesthesia with the patient or authorized representative who has indicated his/her understanding and acceptance.   Dental advisory given  Plan Discussed with: CRNA and Anesthesiologist  Anesthesia Plan Comments:         Anesthesia Quick Evaluation

## 2017-10-22 NOTE — Transfer of Care (Signed)
Immediate Anesthesia Transfer of Care Note  Patient: Crystal Clarke  Procedure(s) Performed: REMOVAL OF ARTERIOVENOUS GORETEX GRAFT ([place]) ARM (Left )  Patient Location: PACU  Anesthesia Type:General  Level of Consciousness: awake, alert  and patient cooperative  Airway & Oxygen Therapy: Patient Spontanous Breathing  Post-op Assessment: Report given to RN and Post -op Vital signs reviewed and stable  Post vital signs: Reviewed and stable  Last Vitals:  Vitals Value Taken Time  BP 93/64 10/22/2017  3:20 PM  Temp    Pulse 67 10/22/2017  3:21 PM  Resp 11 10/22/2017  3:21 PM  SpO2 98 % 10/22/2017  3:21 PM  Vitals shown include unvalidated device data.  Last Pain:  Vitals:   10/22/17 1006  PainSc: 0-No pain      Patients Stated Pain Goal: 3 (25/27/12 9290)  Complications: No apparent anesthesia complications

## 2017-10-23 ENCOUNTER — Encounter (HOSPITAL_COMMUNITY): Payer: Self-pay | Admitting: Vascular Surgery

## 2017-11-01 ENCOUNTER — Other Ambulatory Visit: Payer: Self-pay

## 2017-11-01 ENCOUNTER — Ambulatory Visit (INDEPENDENT_AMBULATORY_CARE_PROVIDER_SITE_OTHER): Payer: Self-pay | Admitting: Vascular Surgery

## 2017-11-01 ENCOUNTER — Encounter: Payer: Self-pay | Admitting: Vascular Surgery

## 2017-11-01 VITALS — BP 106/67 | HR 73 | Temp 96.0°F | Resp 18 | Ht 65.0 in | Wt 134.0 lb

## 2017-11-01 DIAGNOSIS — Z992 Dependence on renal dialysis: Secondary | ICD-10-CM

## 2017-11-01 DIAGNOSIS — N186 End stage renal disease: Secondary | ICD-10-CM

## 2017-11-01 NOTE — Progress Notes (Signed)
She is a 57 year old female who returns for postoperative follow-up today.  She previously had a left upper arm AV graft placed.  She developed a steal in the early postop.  She had removal of her graft last week.  She states she still has a little bit of numbness and tingling in the first 3 digits of the left hand but overall is improved.  She denies any incisional drainage.  She currently is dialyzing via right sided catheter.  She wants to consider peritoneal dialysis.  Physical exam:  Vitals:   11/01/17 1222  BP: 106/67  Pulse: 73  Resp: 18  Temp: (!) 96 F (35.6 C)  TempSrc: Oral  SpO2: 96%  Weight: 134 lb (60.8 kg)  Height: 5\' 5"  (1.651 m)    Extremities: Healing antecubital and axillary incision left arm 2+ brachial radial pulse, right upper extremity 2+ brachial radial pulse  Data: I reviewed the patient's previous vein mapping ultrasound which shows diffusely small veins in both arms.  She would not be a candidate for an AV fistula.  Assessment: Resolving steal symptoms left hand but still with some persistent numbness in the left hand.  Patient currently is dialyzing via catheter.  Consideration needs to be given for a new permanent access.  Plan: The patient will follow-up with me on as-needed basis if she and Dr. Justin Mend decide to proceed with a right arm access.  She states that she is meeting with Dr. Justin Mend next week to discuss whether or not peritoneal dialysis might be better for her.  I will await hearing from Dr. Justin Mend or the patient before going to place a new graft.  Ruta Hinds, MD [facility] [place] Office: [phone] Pager: [phone]

## 2017-11-17 DIAGNOSIS — M3210 Systemic lupus erythematosus, organ or system involvement unspecified: Secondary | ICD-10-CM | POA: Diagnosis not present

## 2017-11-17 DIAGNOSIS — N186 End stage renal disease: Secondary | ICD-10-CM | POA: Diagnosis not present

## 2017-11-17 DIAGNOSIS — Z992 Dependence on renal dialysis: Secondary | ICD-10-CM | POA: Diagnosis not present

## 2017-11-20 DIAGNOSIS — R52 Pain, unspecified: Secondary | ICD-10-CM | POA: Diagnosis not present

## 2017-11-20 DIAGNOSIS — D689 Coagulation defect, unspecified: Secondary | ICD-10-CM | POA: Diagnosis not present

## 2017-11-20 DIAGNOSIS — Z452 Encounter for adjustment and management of vascular access device: Secondary | ICD-10-CM | POA: Diagnosis not present

## 2017-11-20 DIAGNOSIS — N186 End stage renal disease: Secondary | ICD-10-CM | POA: Diagnosis not present

## 2017-11-20 DIAGNOSIS — Z23 Encounter for immunization: Secondary | ICD-10-CM | POA: Diagnosis not present

## 2017-11-23 DIAGNOSIS — E038 Other specified hypothyroidism: Secondary | ICD-10-CM | POA: Diagnosis not present

## 2017-11-23 DIAGNOSIS — I119 Hypertensive heart disease without heart failure: Secondary | ICD-10-CM | POA: Diagnosis not present

## 2017-11-23 DIAGNOSIS — E782 Mixed hyperlipidemia: Secondary | ICD-10-CM | POA: Diagnosis not present

## 2017-11-23 DIAGNOSIS — N186 End stage renal disease: Secondary | ICD-10-CM | POA: Diagnosis not present

## 2017-11-23 DIAGNOSIS — G894 Chronic pain syndrome: Secondary | ICD-10-CM | POA: Diagnosis not present

## 2017-11-23 DIAGNOSIS — R7301 Impaired fasting glucose: Secondary | ICD-10-CM | POA: Diagnosis not present

## 2017-11-23 DIAGNOSIS — M3214 Glomerular disease in systemic lupus erythematosus: Secondary | ICD-10-CM | POA: Diagnosis not present

## 2017-11-28 DIAGNOSIS — N186 End stage renal disease: Secondary | ICD-10-CM | POA: Diagnosis not present

## 2017-11-28 DIAGNOSIS — I2510 Atherosclerotic heart disease of native coronary artery without angina pectoris: Secondary | ICD-10-CM | POA: Diagnosis not present

## 2017-11-28 DIAGNOSIS — Z951 Presence of aortocoronary bypass graft: Secondary | ICD-10-CM | POA: Diagnosis not present

## 2017-11-28 DIAGNOSIS — I739 Peripheral vascular disease, unspecified: Secondary | ICD-10-CM | POA: Diagnosis not present

## 2017-11-28 DIAGNOSIS — M329 Systemic lupus erythematosus, unspecified: Secondary | ICD-10-CM | POA: Diagnosis not present

## 2017-12-10 ENCOUNTER — Ambulatory Visit: Payer: PPO | Admitting: Cardiology

## 2017-12-17 DIAGNOSIS — N186 End stage renal disease: Secondary | ICD-10-CM | POA: Diagnosis not present

## 2017-12-17 DIAGNOSIS — M3210 Systemic lupus erythematosus, organ or system involvement unspecified: Secondary | ICD-10-CM | POA: Diagnosis not present

## 2017-12-17 DIAGNOSIS — Z992 Dependence on renal dialysis: Secondary | ICD-10-CM | POA: Diagnosis not present

## 2017-12-18 DIAGNOSIS — D509 Iron deficiency anemia, unspecified: Secondary | ICD-10-CM | POA: Diagnosis not present

## 2017-12-18 DIAGNOSIS — Z452 Encounter for adjustment and management of vascular access device: Secondary | ICD-10-CM | POA: Diagnosis not present

## 2017-12-18 DIAGNOSIS — D689 Coagulation defect, unspecified: Secondary | ICD-10-CM | POA: Diagnosis not present

## 2017-12-18 DIAGNOSIS — R509 Fever, unspecified: Secondary | ICD-10-CM | POA: Diagnosis not present

## 2017-12-18 DIAGNOSIS — D631 Anemia in chronic kidney disease: Secondary | ICD-10-CM | POA: Diagnosis not present

## 2017-12-18 DIAGNOSIS — R52 Pain, unspecified: Secondary | ICD-10-CM | POA: Diagnosis not present

## 2017-12-18 DIAGNOSIS — N186 End stage renal disease: Secondary | ICD-10-CM | POA: Diagnosis not present

## 2017-12-18 DIAGNOSIS — Z23 Encounter for immunization: Secondary | ICD-10-CM | POA: Diagnosis not present

## 2018-01-17 DIAGNOSIS — Z992 Dependence on renal dialysis: Secondary | ICD-10-CM | POA: Diagnosis not present

## 2018-01-17 DIAGNOSIS — M3210 Systemic lupus erythematosus, organ or system involvement unspecified: Secondary | ICD-10-CM | POA: Diagnosis not present

## 2018-01-17 DIAGNOSIS — N186 End stage renal disease: Secondary | ICD-10-CM | POA: Diagnosis not present

## 2018-01-22 DIAGNOSIS — D509 Iron deficiency anemia, unspecified: Secondary | ICD-10-CM | POA: Diagnosis not present

## 2018-01-22 DIAGNOSIS — R52 Pain, unspecified: Secondary | ICD-10-CM | POA: Diagnosis not present

## 2018-01-22 DIAGNOSIS — N186 End stage renal disease: Secondary | ICD-10-CM | POA: Diagnosis not present

## 2018-01-22 DIAGNOSIS — D689 Coagulation defect, unspecified: Secondary | ICD-10-CM | POA: Diagnosis not present

## 2018-01-22 DIAGNOSIS — Z23 Encounter for immunization: Secondary | ICD-10-CM | POA: Diagnosis not present

## 2018-01-22 DIAGNOSIS — D631 Anemia in chronic kidney disease: Secondary | ICD-10-CM | POA: Diagnosis not present

## 2018-01-22 DIAGNOSIS — Z452 Encounter for adjustment and management of vascular access device: Secondary | ICD-10-CM | POA: Diagnosis not present

## 2018-02-01 DIAGNOSIS — S30820A Blister (nonthermal) of lower back and pelvis, initial encounter: Secondary | ICD-10-CM | POA: Diagnosis not present

## 2018-02-01 DIAGNOSIS — L89312 Pressure ulcer of right buttock, stage 2: Secondary | ICD-10-CM | POA: Diagnosis not present

## 2018-02-05 ENCOUNTER — Encounter: Payer: Self-pay | Admitting: Cardiology

## 2018-02-05 ENCOUNTER — Ambulatory Visit (INDEPENDENT_AMBULATORY_CARE_PROVIDER_SITE_OTHER): Payer: PPO | Admitting: Cardiology

## 2018-02-05 VITALS — BP 120/64 | HR 88 | Ht 65.5 in | Wt 136.8 lb

## 2018-02-05 DIAGNOSIS — M328 Other forms of systemic lupus erythematosus: Secondary | ICD-10-CM

## 2018-02-05 DIAGNOSIS — I2510 Atherosclerotic heart disease of native coronary artery without angina pectoris: Secondary | ICD-10-CM

## 2018-02-05 DIAGNOSIS — Z992 Dependence on renal dialysis: Secondary | ICD-10-CM | POA: Diagnosis not present

## 2018-02-05 DIAGNOSIS — N186 End stage renal disease: Secondary | ICD-10-CM | POA: Diagnosis not present

## 2018-02-05 DIAGNOSIS — Z951 Presence of aortocoronary bypass graft: Secondary | ICD-10-CM

## 2018-02-05 NOTE — Progress Notes (Signed)
Mailed discharge paperwork and echo appointment due to patient needing to leave early in office for dialysis.

## 2018-02-05 NOTE — Patient Instructions (Signed)
Medication Instructions:  Your physician recommends that you continue on your current medications as directed. Please refer to the Current Medication list given to you today.  If you need a refill on your cardiac medications before your next appointment, please call your pharmacy.   Lab work: None.  If you have labs (blood work) drawn today and your tests are completely normal, you will receive your results only by: Marland Kitchen MyChart Message (if you have MyChart) OR . A paper copy in the mail If you have any lab test that is abnormal or we need to change your treatment, we will call you to review the results.  Testing/Procedures: Your physician has requested that you have an echocardiogram. Echocardiography is a painless test that uses sound waves to create images of your heart. It provides your doctor with information about the size and shape of your heart and how well your heart's chambers and valves are working. This procedure takes approximately one hour. There are no restrictions for this procedure.    Follow-Up: At [facility], you and your health needs are our priority.  As part of our continuing mission to provide you with exceptional heart care, we have created designated Provider Care Teams.  These Care Teams include your primary Cardiologist (physician) and Advanced Practice Providers (APPs -  Physician Assistants and Nurse Practitioners) who all work together to provide you with the care you need, when you need it. You will need a follow up appointment in 4 months.  Please call our office 2 months in advance to schedule this appointment.  You may see No primary care provider on file. or another member of our [employer] Provider Team in [place]: Shirlee More, MD . Jyl Heinz, MD  Any Other Special Instructions Will Be Listed Below (If Applicable).  Echocardiogram An echocardiogram, or echocardiography, uses sound waves (ultrasound) to produce an image of your heart. The  echocardiogram is simple, painless, obtained within a short period of time, and offers valuable information to your health care provider. The images from an echocardiogram can provide information such as:  Evidence of coronary artery disease (CAD).  Heart size.  Heart muscle function.  Heart valve function.  Aneurysm detection.  Evidence of a past heart attack.  Fluid buildup around the heart.  Heart muscle thickening.  Assess heart valve function.  Tell a health care provider about:  Any allergies you have.  All medicines you are taking, including vitamins, herbs, eye drops, creams, and over-the-counter medicines.  Any problems you or family members have had with anesthetic medicines.  Any blood disorders you have.  Any surgeries you have had.  Any medical conditions you have.  Whether you are pregnant or may be pregnant. What happens before the procedure? No special preparation is needed. Eat and drink normally. What happens during the procedure?  In order to produce an image of your heart, gel will be applied to your chest and a wand-like tool (transducer) will be moved over your chest. The gel will help transmit the sound waves from the transducer. The sound waves will harmlessly bounce off your heart to allow the heart images to be captured in real-time motion. These images will then be recorded.  You may need an IV to receive a medicine that improves the quality of the pictures. What happens after the procedure? You may return to your normal schedule including diet, activities, and medicines, unless your health care provider tells you otherwise. This information is not intended to replace advice  given to you by your health care provider. Make sure you discuss any questions you have with your health care provider. Document Released: 03/03/2000 Document Revised: 10/23/2015 Document Reviewed: 11/11/2012 Elsevier Interactive Patient Education  2017 [employer].

## 2018-02-05 NOTE — Progress Notes (Signed)
Cardiology Office Note:    Date:  02/05/2018   ID:  Katheren Puller, DOB 1960-03-29, MRN [id number]  PCP:  Rochel Brome, MD  Cardiologist:  Jenne Campus, MD    Referring MD: Rochel Brome, MD   Chief Complaint  Patient presents with  . Pre-op Exam  I need surgery  History of Present Illness:    Crystal Clarke is a 57 y.o. female that had known for many years she does have coronary artery disease with coronary artery bypass graft years ago she also got quite advanced lupus erythematosus that eventually ended up creating renal failure.  Apparently in April she ended up going to [facility] because of non-STEMI I did with talks about doing cardiac catheterization however she was confused at the time and also not dialyzed yet dialysis was initiated and after that she was offered cardiac catheterization however she declined she did not want not want to have it done.  Today she comes to my office because she will require to surgery to place a catheter in her abdominal cavity for peritoneal dialysis denies have any chest pain tightness squeezing pressure burning chest.  She did have polio when she was a child and she uses crutches therefore it is difficult to judge her ability to exercise.  Past Medical History:  Diagnosis Date  . Anemia   . Anxiety   . Arthritis   . Blood transfusion without reported diagnosis   . CHF (congestive heart failure) ([place])   . Chronic kidney disease   . Constipation   . Coronary artery disease   . Depression   . GERD (gastroesophageal reflux disease)   . Headache   . Heart murmur   . Hyperlipidemia   . Hypertension   . Hypothyroidism   . Lupus (systemic lupus erythematosus) ([place])   . 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 Myocardial infarction ([place])   . Osteoporosis   . Pneumonia   . PONV (postoperative nausea and vomiting)   . Pre-diabetes   . Stroke ([place])   . Thyroid disease     Past Surgical History:  Procedure Laterality Date  . AV  FISTULA PLACEMENT Left 10/03/2017   Procedure: INSERTION OF ARTERIOVENOUS (AV) GORE-TEX STRETCH 4-7MM GRAFT ARM;  Surgeon: Elam Dutch, MD;  Location: [place];  Service: Vascular;  Laterality: Left;  . [place] REMOVAL Left 10/22/2017   Procedure: REMOVAL OF ARTERIOVENOUS GORETEX GRAFT ([place]) ARM;  Surgeon: Elam Dutch, MD;  Location: [place];  Service: Vascular;  Laterality: Left;  . BREAST SURGERY Left    excisional biopsy  . COLONOSCOPY    . CORONARY ARTERY BYPASS GRAFT    . JOINT REPLACEMENT     right knee, left knee and left hip  . MUSCLE FLAP CLOSURE Right 1990   right hip  . WISDOM TOOTH EXTRACTION      Current Medications: Current Meds  Medication Sig  . ALPRAZolam (XANAX) 0.5 MG tablet Take 0.5 mg by mouth daily as needed for anxiety.   . ARTIFICIAL TEAR OP Place 1 drop into both eyes daily as needed (dry eyes).  Marland Kitchen aspirin EC 81 MG tablet Take 81 mg by mouth daily.  Marland Kitchen atorvastatin (LIPITOR) 40 MG tablet Take 40 mg by mouth at bedtime.   . clopidogrel (PLAVIX) 75 MG tablet Take 1 tablet (75 mg total) by mouth daily.  Marland Kitchen dexlansoprazole (DEXILANT) 60 MG capsule Take 60 mg by mouth daily.  . diclofenac sodium (VOLTAREN) 1 % GEL  Apply 1 application topically 4 (four) times daily as needed (pain).   Marland Kitchen ezetimibe (ZETIA) 10 MG tablet Take 10 mg by mouth every evening.   Marland Kitchen FLUoxetine (PROZAC) 10 MG capsule   . hydroxychloroquine (PLAQUENIL) 200 MG tablet Take 200 mg by mouth daily.  Marland Kitchen levothyroxine (SYNTHROID, LEVOTHROID) 100 MCG tablet Take 100 mcg by mouth daily before breakfast.  . MOVANTIK 25 MG TABS tablet Take 25 mg by mouth daily as needed for constipation.  . Multiple Vitamin (DAILY VITE) TABS Take 1 tablet by mouth every evening.  . nitroGLYCERIN (NITROSTAT) 0.4 MG SL tablet Place 0.4 mg under the tongue every 5 (five) minutes as needed.  Marland Kitchen oxyCODONE 30 MG 12 hr tablet Take 30 mg by mouth 2 (two) times daily.   . Oxycodone HCl 10 MG TABS Take 10 mg by mouth 2 (two) times  daily as needed (breakthrough pain).   . QUEtiapine Fumarate (SEROQUEL XR) 150 MG 24 hr tablet Take 150 mg by mouth at bedtime.  . ranitidine (ZANTAC) 150 MG capsule Take 150 mg by mouth at bedtime.  [name] (SENNA PLUS PO) Take 2 tablets by mouth 2 (two) times daily.  Marland Kitchen zolpidem (AMBIEN) 10 MG tablet Take 10 mg by mouth at bedtime as needed for sleep.      Allergies:   Vancomycin; Sulfa antibiotics; Ace inhibitors; and Ancef [cefazolin]   Social History   Socioeconomic History  . Marital status: Divorced    Spouse name: Not on file  . Number of children: Not on file  . Years of education: Not on file  . Highest education level: Not on file  Occupational History  . Not on file  Social Needs  . Financial resource strain: Not on file  . Food insecurity:    Worry: Not on file    Inability: Not on file  . Transportation needs:    Medical: Not on file    Non-medical: Not on file  Tobacco Use  . Smoking status: Never Smoker  . Smokeless tobacco: Never Used  Substance and Sexual Activity  . Alcohol use: No  . Drug use: No  . Sexual activity: Not Currently  Lifestyle  . Physical activity:    Days per week: Not on file    Minutes per session: Not on file  . Stress: Not on file  Relationships  . Social connections:    Talks on phone: Not on file    Gets together: Not on file    Attends religious service: Not on file    Active member of club or organization: Not on file    Attends meetings of clubs or organizations: Not on file    Relationship status: Not on file  Other Topics Concern  . Not on file  Social History Narrative  . Not on file     Family History: The patient's family history includes Dementia in her father. ROS:   Please see the history of present illness.    All 14 point review of systems negative except as described per history of present illness  EKGs/Labs/Other Studies Reviewed:      Recent Labs: 07/30/2017: ALT 9 08/02/2017:  BUN 51; Creatinine, Ser 4.63; Platelets 265 10/22/2017: Hemoglobin 14.3; Potassium 3.7; Sodium 138  Recent Lipid Panel No results found for: CHOL, TRIG, HDL, CHOLHDL, VLDL, LDLCALC, LDLDIRECT  Physical Exam:    VS:  BP 120/64   Pulse 88   Ht 5' 5.5" (1.664 m)   Wt 136 lb 12.8  oz (62.1 kg)   SpO2 91%   BMI 22.42 kg/m     Wt Readings from Last 3 Encounters:  02/05/18 136 lb 12.8 oz (62.1 kg)  11/01/17 134 lb (60.8 kg)  10/22/17 128 lb (58.1 kg)     GEN:  Well nourished, well developed in no acute distress HEENT: Normal NECK: No JVD; No carotid bruits LYMPHATICS: No lymphadenopathy CARDIAC: RRR, no murmurs, no rubs, no gallops RESPIRATORY:  Clear to auscultation without rales, wheezing or rhonchi  ABDOMEN: Soft, non-tender, non-distended MUSCULOSKELETAL:  No edema; No deformity  SKIN: Warm and dry LOWER EXTREMITIES: no swelling NEUROLOGIC:  Alert and oriented x 3 PSYCHIATRIC:  Normal affect   ASSESSMENT:    1. ESRD on dialysis ([place])   2. Status post coronary artery bypass graft   3. Coronary artery disease involving native coronary artery of native heart without angina pectoris   4. Other forms of systemic lupus erythematosus, unspecified organ involvement status ([place])    PLAN:    In order of problems listed above:  1. Coronary artery disease status post coronary artery bypass graft seems to be doing well from that point review had episode of non-STEMI in April he refused to have any work-up done at that time seems to be doing well clinically right now.  She apparently required surgery to place a tunneled catheter to help with the dialysis however she seems to be doing well overall from what I can understand that she understand the surgery can be done under sedation and local anesthesia if that is the case of absent no objections against the surgery.  We are talking about potentially doing some stress testing to try to evaluate him for presence of active coronary artery  disease she declined she refused she says she does not want to have anything like that done. 2. Dialysis she is doing well from that point review again plan is to switch to peritoneal dialysis the reason for that is because of advanced atherosclerosis she has no access to have hemodialysis in long-term now she got dual lumen catheter in the right subclavian artery. 3. Lupus erythematosus.  Noted.  Overall I have not seen Crystal Clarke for last few years she seems to be looking good.  I will ask her to have echocardiogram to assess left ventricular ejection fraction.  And again if surgery cannot be done under local anesthesia with sedation we can proceed.   Medication Adjustments/Labs and Tests Ordered: Current medicines are reviewed at length with the patient today.  Concerns regarding medicines are outlined above.  No orders of the defined types were placed in this encounter.  Medication changes: No orders of the defined types were placed in this encounter.   Signed, Park Liter, MD, [facility] 02/05/2018 12:00 PM    [place]

## 2018-02-16 DIAGNOSIS — Z992 Dependence on renal dialysis: Secondary | ICD-10-CM | POA: Diagnosis not present

## 2018-02-16 DIAGNOSIS — M3210 Systemic lupus erythematosus, organ or system involvement unspecified: Secondary | ICD-10-CM | POA: Diagnosis not present

## 2018-02-16 DIAGNOSIS — N186 End stage renal disease: Secondary | ICD-10-CM | POA: Diagnosis not present

## 2018-02-19 DIAGNOSIS — N186 End stage renal disease: Secondary | ICD-10-CM | POA: Diagnosis not present

## 2018-02-19 DIAGNOSIS — D689 Coagulation defect, unspecified: Secondary | ICD-10-CM | POA: Diagnosis not present

## 2018-02-19 DIAGNOSIS — Z452 Encounter for adjustment and management of vascular access device: Secondary | ICD-10-CM | POA: Diagnosis not present

## 2018-02-19 DIAGNOSIS — Z833 Family history of diabetes mellitus: Secondary | ICD-10-CM | POA: Diagnosis not present

## 2018-02-19 DIAGNOSIS — R52 Pain, unspecified: Secondary | ICD-10-CM | POA: Diagnosis not present

## 2018-02-19 DIAGNOSIS — E039 Hypothyroidism, unspecified: Secondary | ICD-10-CM | POA: Diagnosis not present

## 2018-02-19 DIAGNOSIS — E782 Mixed hyperlipidemia: Secondary | ICD-10-CM | POA: Diagnosis not present

## 2018-02-20 DIAGNOSIS — I739 Peripheral vascular disease, unspecified: Secondary | ICD-10-CM | POA: Diagnosis not present

## 2018-02-20 DIAGNOSIS — I2510 Atherosclerotic heart disease of native coronary artery without angina pectoris: Secondary | ICD-10-CM | POA: Diagnosis not present

## 2018-02-20 DIAGNOSIS — N186 End stage renal disease: Secondary | ICD-10-CM | POA: Diagnosis not present

## 2018-02-20 DIAGNOSIS — M329 Systemic lupus erythematosus, unspecified: Secondary | ICD-10-CM | POA: Diagnosis not present

## 2018-02-25 DIAGNOSIS — F331 Major depressive disorder, recurrent, moderate: Secondary | ICD-10-CM | POA: Diagnosis not present

## 2018-02-25 DIAGNOSIS — E782 Mixed hyperlipidemia: Secondary | ICD-10-CM | POA: Diagnosis not present

## 2018-02-25 DIAGNOSIS — I119 Hypertensive heart disease without heart failure: Secondary | ICD-10-CM | POA: Diagnosis not present

## 2018-02-25 DIAGNOSIS — G894 Chronic pain syndrome: Secondary | ICD-10-CM | POA: Diagnosis not present

## 2018-02-25 DIAGNOSIS — M3214 Glomerular disease in systemic lupus erythematosus: Secondary | ICD-10-CM | POA: Diagnosis not present

## 2018-02-25 DIAGNOSIS — Z992 Dependence on renal dialysis: Secondary | ICD-10-CM | POA: Diagnosis not present

## 2018-02-25 DIAGNOSIS — R7301 Impaired fasting glucose: Secondary | ICD-10-CM | POA: Diagnosis not present

## 2018-02-25 DIAGNOSIS — M542 Cervicalgia: Secondary | ICD-10-CM | POA: Diagnosis not present

## 2018-02-25 DIAGNOSIS — E038 Other specified hypothyroidism: Secondary | ICD-10-CM | POA: Diagnosis not present

## 2018-02-25 DIAGNOSIS — N186 End stage renal disease: Secondary | ICD-10-CM | POA: Diagnosis not present

## 2018-03-19 DIAGNOSIS — Z7982 Long term (current) use of aspirin: Secondary | ICD-10-CM | POA: Diagnosis not present

## 2018-03-19 DIAGNOSIS — M3210 Systemic lupus erythematosus, organ or system involvement unspecified: Secondary | ICD-10-CM | POA: Diagnosis not present

## 2018-03-19 DIAGNOSIS — Z7952 Long term (current) use of systemic steroids: Secondary | ICD-10-CM | POA: Diagnosis not present

## 2018-03-19 DIAGNOSIS — N186 End stage renal disease: Secondary | ICD-10-CM | POA: Diagnosis not present

## 2018-03-19 DIAGNOSIS — Z4902 Encounter for fitting and adjustment of peritoneal dialysis catheter: Secondary | ICD-10-CM | POA: Diagnosis not present

## 2018-03-19 DIAGNOSIS — Z7902 Long term (current) use of antithrombotics/antiplatelets: Secondary | ICD-10-CM | POA: Diagnosis not present

## 2018-03-19 DIAGNOSIS — Z951 Presence of aortocoronary bypass graft: Secondary | ICD-10-CM | POA: Diagnosis not present

## 2018-03-19 DIAGNOSIS — N185 Chronic kidney disease, stage 5: Secondary | ICD-10-CM | POA: Diagnosis not present

## 2018-03-19 DIAGNOSIS — Z992 Dependence on renal dialysis: Secondary | ICD-10-CM | POA: Diagnosis not present

## 2018-03-19 DIAGNOSIS — Z87891 Personal history of nicotine dependence: Secondary | ICD-10-CM | POA: Diagnosis not present

## 2018-03-19 DIAGNOSIS — I252 Old myocardial infarction: Secondary | ICD-10-CM | POA: Diagnosis not present

## 2018-03-19 DIAGNOSIS — M329 Systemic lupus erythematosus, unspecified: Secondary | ICD-10-CM | POA: Diagnosis not present

## 2018-03-21 DIAGNOSIS — D631 Anemia in chronic kidney disease: Secondary | ICD-10-CM | POA: Diagnosis not present

## 2018-03-21 DIAGNOSIS — Z23 Encounter for immunization: Secondary | ICD-10-CM | POA: Diagnosis not present

## 2018-03-21 DIAGNOSIS — Z452 Encounter for adjustment and management of vascular access device: Secondary | ICD-10-CM | POA: Diagnosis not present

## 2018-03-21 DIAGNOSIS — N186 End stage renal disease: Secondary | ICD-10-CM | POA: Diagnosis not present

## 2018-03-21 DIAGNOSIS — D689 Coagulation defect, unspecified: Secondary | ICD-10-CM | POA: Diagnosis not present

## 2018-03-26 ENCOUNTER — Other Ambulatory Visit: Payer: Self-pay

## 2018-04-15 DIAGNOSIS — R17 Unspecified jaundice: Secondary | ICD-10-CM | POA: Diagnosis not present

## 2018-04-15 DIAGNOSIS — N2581 Secondary hyperparathyroidism of renal origin: Secondary | ICD-10-CM | POA: Diagnosis not present

## 2018-04-15 DIAGNOSIS — N186 End stage renal disease: Secondary | ICD-10-CM | POA: Diagnosis not present

## 2018-04-15 DIAGNOSIS — Z79899 Other long term (current) drug therapy: Secondary | ICD-10-CM | POA: Diagnosis not present

## 2018-04-15 DIAGNOSIS — D509 Iron deficiency anemia, unspecified: Secondary | ICD-10-CM | POA: Diagnosis not present

## 2018-04-15 DIAGNOSIS — N2589 Other disorders resulting from impaired renal tubular function: Secondary | ICD-10-CM | POA: Diagnosis not present

## 2018-04-15 DIAGNOSIS — E46 Unspecified protein-calorie malnutrition: Secondary | ICD-10-CM | POA: Diagnosis not present

## 2018-04-15 DIAGNOSIS — D689 Coagulation defect, unspecified: Secondary | ICD-10-CM | POA: Diagnosis not present

## 2018-04-15 DIAGNOSIS — R82998 Other abnormal findings in urine: Secondary | ICD-10-CM | POA: Diagnosis not present

## 2018-04-18 DIAGNOSIS — N2581 Secondary hyperparathyroidism of renal origin: Secondary | ICD-10-CM | POA: Diagnosis not present

## 2018-04-18 DIAGNOSIS — N186 End stage renal disease: Secondary | ICD-10-CM | POA: Diagnosis not present

## 2018-04-19 DIAGNOSIS — M3210 Systemic lupus erythematosus, organ or system involvement unspecified: Secondary | ICD-10-CM | POA: Diagnosis not present

## 2018-04-19 DIAGNOSIS — Z992 Dependence on renal dialysis: Secondary | ICD-10-CM | POA: Diagnosis not present

## 2018-04-19 DIAGNOSIS — N186 End stage renal disease: Secondary | ICD-10-CM | POA: Diagnosis not present

## 2018-04-20 DIAGNOSIS — Z992 Dependence on renal dialysis: Secondary | ICD-10-CM | POA: Diagnosis not present

## 2018-04-20 DIAGNOSIS — M3210 Systemic lupus erythematosus, organ or system involvement unspecified: Secondary | ICD-10-CM | POA: Diagnosis not present

## 2018-04-20 DIAGNOSIS — N186 End stage renal disease: Secondary | ICD-10-CM | POA: Diagnosis not present

## 2018-04-22 DIAGNOSIS — E46 Unspecified protein-calorie malnutrition: Secondary | ICD-10-CM | POA: Diagnosis not present

## 2018-04-22 DIAGNOSIS — Z79899 Other long term (current) drug therapy: Secondary | ICD-10-CM | POA: Diagnosis not present

## 2018-04-22 DIAGNOSIS — N2581 Secondary hyperparathyroidism of renal origin: Secondary | ICD-10-CM | POA: Diagnosis not present

## 2018-04-22 DIAGNOSIS — Z452 Encounter for adjustment and management of vascular access device: Secondary | ICD-10-CM | POA: Diagnosis not present

## 2018-04-22 DIAGNOSIS — D689 Coagulation defect, unspecified: Secondary | ICD-10-CM | POA: Diagnosis not present

## 2018-04-22 DIAGNOSIS — D509 Iron deficiency anemia, unspecified: Secondary | ICD-10-CM | POA: Diagnosis not present

## 2018-04-22 DIAGNOSIS — N186 End stage renal disease: Secondary | ICD-10-CM | POA: Diagnosis not present

## 2018-04-22 DIAGNOSIS — E878 Other disorders of electrolyte and fluid balance, not elsewhere classified: Secondary | ICD-10-CM | POA: Diagnosis not present

## 2018-04-22 DIAGNOSIS — D631 Anemia in chronic kidney disease: Secondary | ICD-10-CM | POA: Diagnosis not present

## 2018-04-26 DIAGNOSIS — N2581 Secondary hyperparathyroidism of renal origin: Secondary | ICD-10-CM | POA: Diagnosis not present

## 2018-04-26 DIAGNOSIS — Z452 Encounter for adjustment and management of vascular access device: Secondary | ICD-10-CM | POA: Diagnosis not present

## 2018-04-26 DIAGNOSIS — D689 Coagulation defect, unspecified: Secondary | ICD-10-CM | POA: Diagnosis not present

## 2018-04-26 DIAGNOSIS — N186 End stage renal disease: Secondary | ICD-10-CM | POA: Diagnosis not present

## 2018-05-08 ENCOUNTER — Other Ambulatory Visit: Payer: Self-pay

## 2018-05-19 DEATH — deceased

## 2019-03-26 IMAGING — CT CT HIP*R* W/O CM
2 of 3 series · 17 of 46 positions shown, 19 images · non-contrast
Comparison: Right hip radiographs performed 07/30/2017

CLINICAL DATA: Subacute onset of worsening right hip pain, with
right hip joint effusion.

EXAM:
CT OF THE RIGHT HIP WITHOUT CONTRAST
TECHNIQUE: Multidetector CT imaging of the right hip was performed according to
the standard protocol. Multiplanar CT image reconstructions were
also generated.

[Series 5: hip 2.0 st · axial · 0.42mm/px · z∈[+1084,+1300]mm · 14 of 124 slices shown, 16 images]
[im 8/124  soft-tissue]
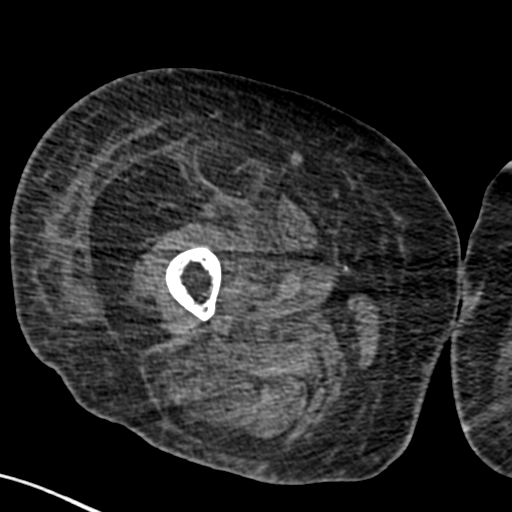
[im 8/124  bone]
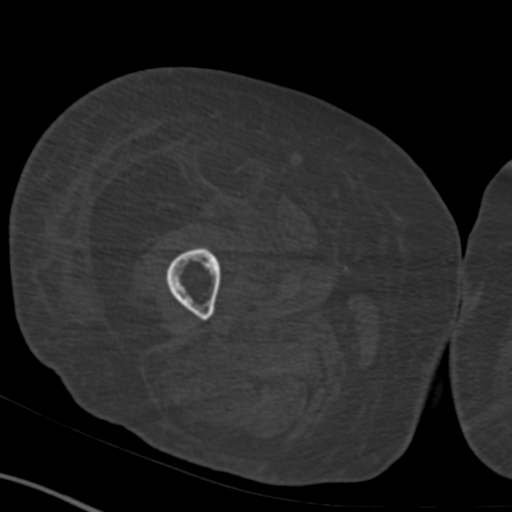
[im 16/124  soft-tissue]
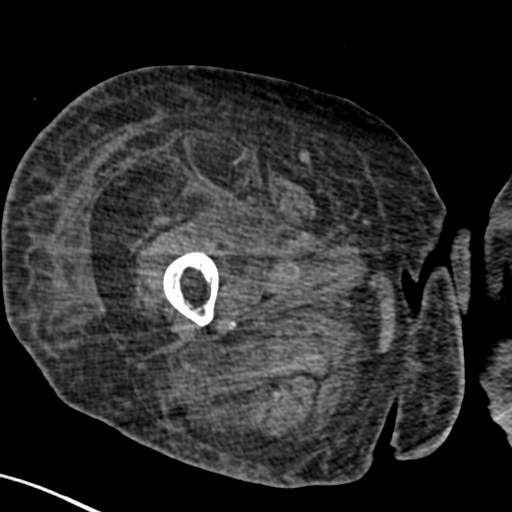
[im 24/124  soft-tissue]
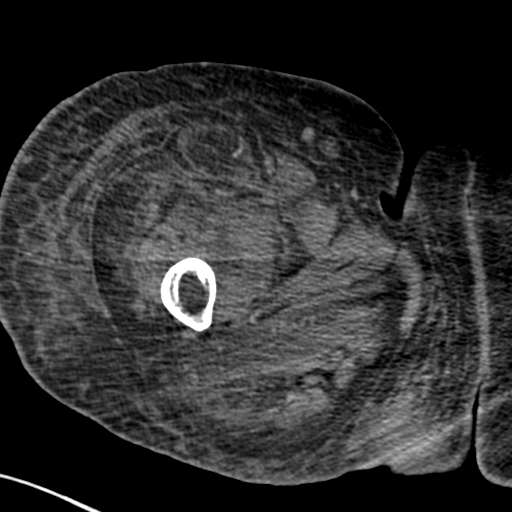
[im 32/124  soft-tissue]
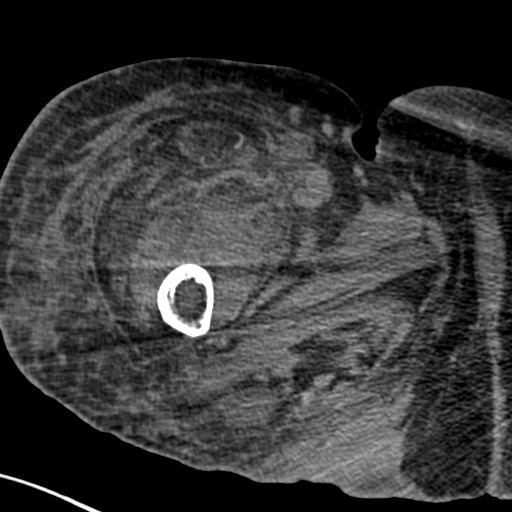
[im 40/124  soft-tissue]
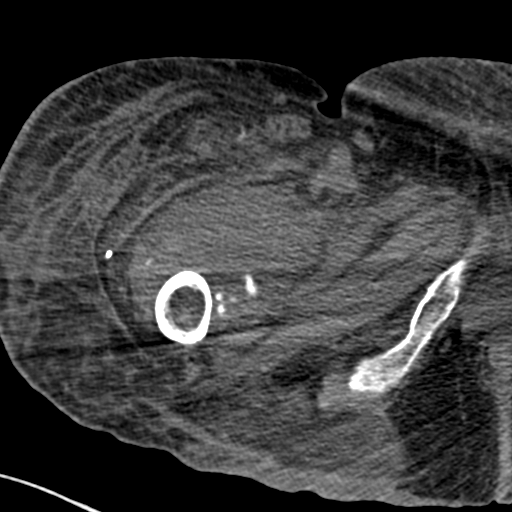
[im 48/124  soft-tissue]
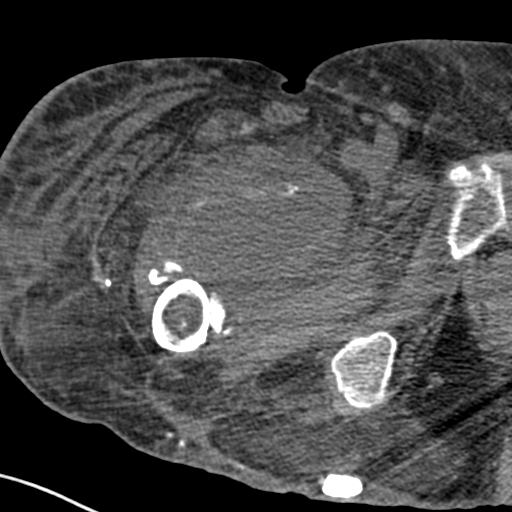
[im 56/124  soft-tissue]
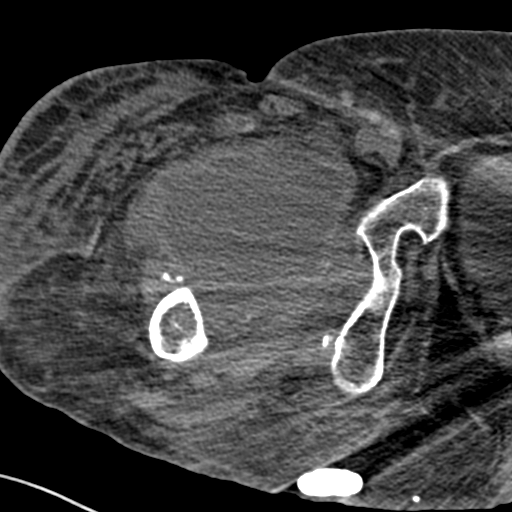
[im 68/124  soft-tissue]
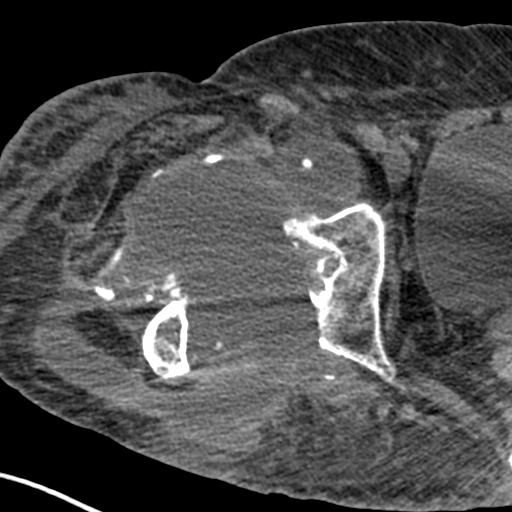
[im 76/124  soft-tissue]
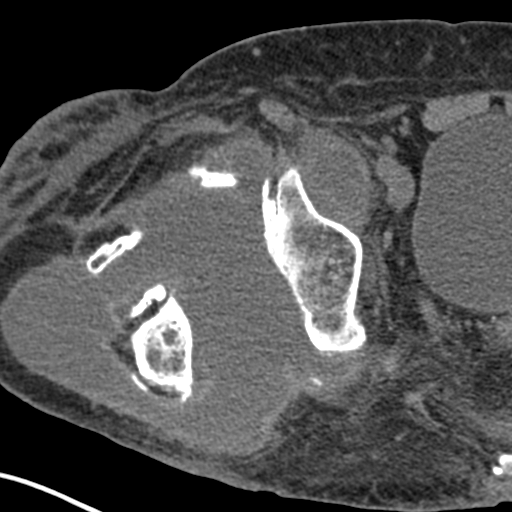
[im 76/124  bone]
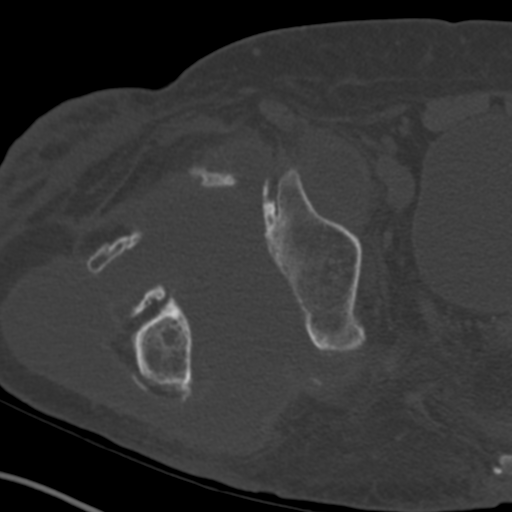
[im 84/124  soft-tissue]
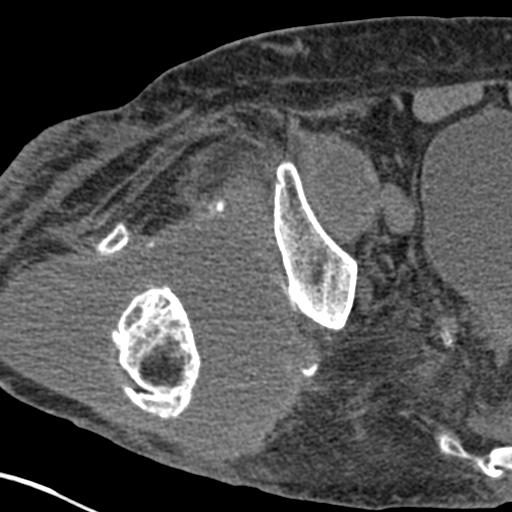
[im 92/124  soft-tissue]
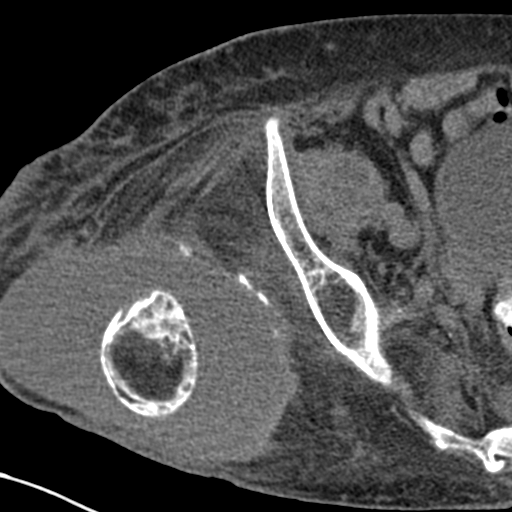
[im 100/124  soft-tissue]
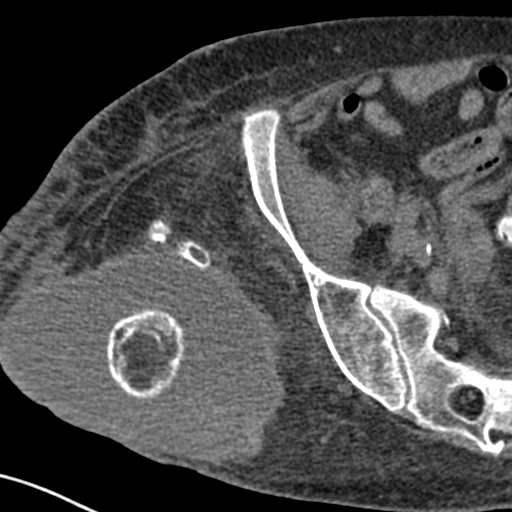
[im 108/124  soft-tissue]
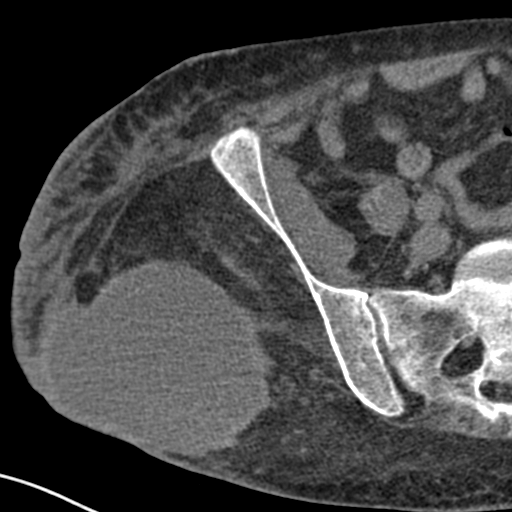
[im 116/124  soft-tissue]
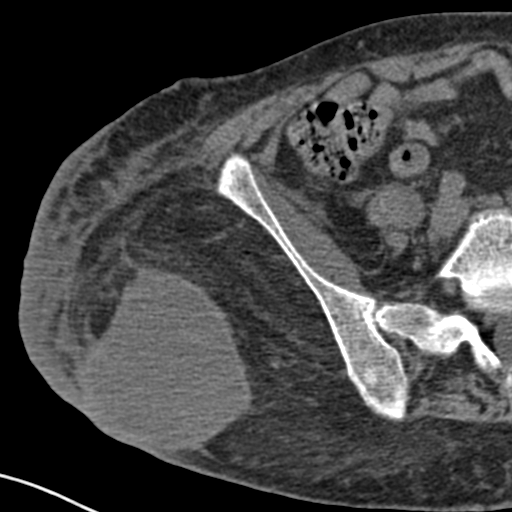

[Series 8: hip 2.0 cor. st · coronal · 0.47mm/px · 3 of 97 slices shown]
[im 33/97  soft-tissue]
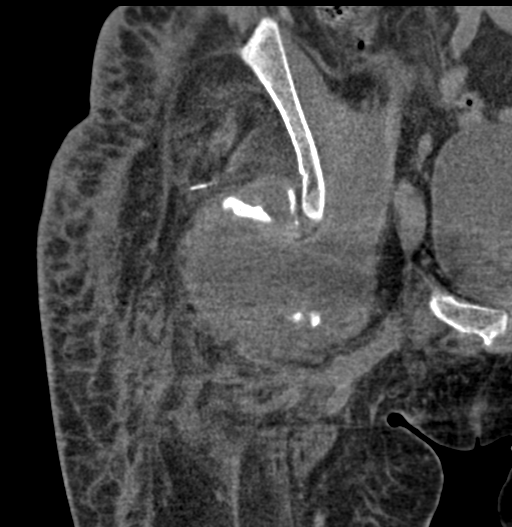
[im 43/97  soft-tissue]
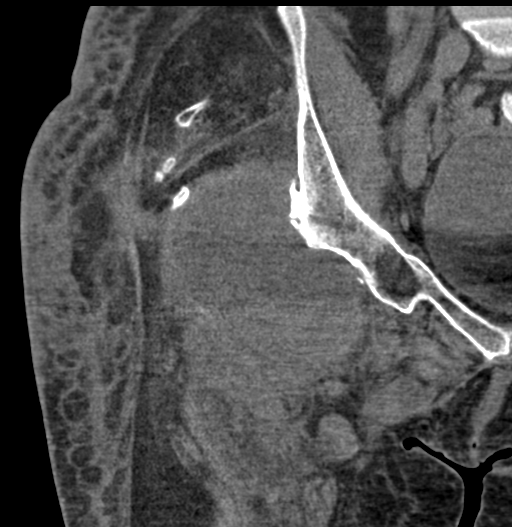
[im 54/97  soft-tissue]
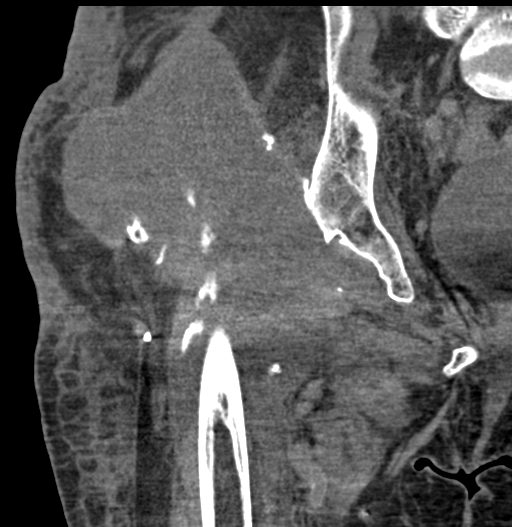

[17 of 46 positions shown; findings below may reference images not displayed]

FINDINGS: Bones/Joint/Cartilage

As described on recent radiograph, there is chronic osteolysis of
the right hip, with resorption of the right femoral head and neck.
There is superior displacement of the remaining proximal right
femur. A large fluid collection is noted surrounding the proximal
right femur, measuring approximately 18 x 13 x 12 cm. Peripheral
soft tissue thickening is noted, concerning for a septic effusion.
The attenuation suggests against hematoma.

The effusion extends about the remaining flattened right acetabular
fossa. There is no evidence of acute fracture.

Ligaments

Suboptimally assessed by CT.

Muscles and Tendons

There is diffuse atrophy of much of the musculature of the proximal
right thigh. Tendon structures are not well characterized.

Soft tissues

Overlying diffuse soft tissue edema and skin thickening are noted
along the proximal right thigh. The visualized portions of the
abdomen are grossly unremarkable.
IMPRESSION: 1. Chronic osteolysis of the right hip, with resorption of the right
femoral head and neck. Superior displacement of the remaining
proximal right femur.
2. Large fluid collection surrounding the proximal right femur,
measuring approximately 18 x 13 x 12 cm. Peripheral soft tissue
thickening noted, concerning for a septic joint effusion. No
definite evidence of hematoma.
3. Diffuse soft tissue edema and skin thickening along the proximal
right thigh.
